# Patient Record
Sex: Female | Born: 1965
Health system: Southern US, Community
[De-identification: ages and names within clinical notes are randomized; demographics above are authoritative.]

## PROBLEM LIST (undated history)

## (undated) DIAGNOSIS — K635 Polyp of colon: Secondary | ICD-10-CM

## (undated) DIAGNOSIS — N393 Stress incontinence (female) (male): Secondary | ICD-10-CM

## (undated) DIAGNOSIS — M1711 Unilateral primary osteoarthritis, right knee: Secondary | ICD-10-CM

## (undated) DIAGNOSIS — F32A Depression, unspecified: Secondary | ICD-10-CM

## (undated) DIAGNOSIS — R569 Unspecified convulsions: Secondary | ICD-10-CM

## (undated) DIAGNOSIS — Z87442 Personal history of urinary calculi: Secondary | ICD-10-CM

## (undated) DIAGNOSIS — Z720 Tobacco use: Secondary | ICD-10-CM

## (undated) DIAGNOSIS — K219 Gastro-esophageal reflux disease without esophagitis: Secondary | ICD-10-CM

## (undated) DIAGNOSIS — M797 Fibromyalgia: Secondary | ICD-10-CM

## (undated) DIAGNOSIS — G4733 Obstructive sleep apnea (adult) (pediatric): Secondary | ICD-10-CM

## (undated) HISTORY — PX: COLON RESECTION: SHX5231

## (undated) HISTORY — PX: RIGHT OOPHORECTOMY: SHX2359

## (undated) HISTORY — PX: CHOLECYSTECTOMY: SHX55

## (undated) HISTORY — PX: APPENDECTOMY: SHX54

## (undated) HISTORY — PX: OVARY SURGERY: SHX727

## (undated) HISTORY — PX: KNEE ARTHROSCOPY: SHX127

## (undated) HISTORY — DX: Gastro-esophageal reflux disease without esophagitis: K21.9

---
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
## 1992-12-28 HISTORY — PX: LEFT OOPHORECTOMY: SHX1961

## 1998-06-29 ENCOUNTER — Inpatient Hospital Stay (HOSPITAL_COMMUNITY): Admission: AD | Admit: 1998-06-29 | Discharge: 1998-06-29 | Payer: Self-pay | Admitting: *Deleted

## 2002-12-28 HISTORY — PX: LAPAROSCOPIC ASSISTED VENTRAL HERNIA REPAIR: SHX6312

## 2002-12-28 HISTORY — PX: COLON RESECTION: SHX5231

## 2011-12-29 DIAGNOSIS — G459 Transient cerebral ischemic attack, unspecified: Secondary | ICD-10-CM

## 2011-12-29 HISTORY — DX: Transient cerebral ischemic attack, unspecified: G45.9

## 2015-01-18 HISTORY — PX: COLONOSCOPY: SHX174

## 2018-07-11 ENCOUNTER — Emergency Department: Payer: Medicare (Managed Care)

## 2018-07-11 ENCOUNTER — Emergency Department
Admission: EM | Admit: 2018-07-11 | Discharge: 2018-07-11 | Disposition: A | Discharge status: Home / Self Care | Payer: Medicare (Managed Care) | Attending: Emergency Medicine | Admitting: Emergency Medicine

## 2018-07-11 ENCOUNTER — Other Ambulatory Visit: Payer: Self-pay

## 2018-07-11 DIAGNOSIS — Y998 Other external cause status: Secondary | ICD-10-CM | POA: Insufficient documentation

## 2018-07-11 DIAGNOSIS — Z9049 Acquired absence of other specified parts of digestive tract: Secondary | ICD-10-CM | POA: Insufficient documentation

## 2018-07-11 DIAGNOSIS — S2232XA Fracture of one rib, left side, initial encounter for closed fracture: Secondary | ICD-10-CM

## 2018-07-11 DIAGNOSIS — Y929 Unspecified place or not applicable: Secondary | ICD-10-CM | POA: Insufficient documentation

## 2018-07-11 DIAGNOSIS — S299XXA Unspecified injury of thorax, initial encounter: Secondary | ICD-10-CM | POA: Diagnosis present

## 2018-07-11 DIAGNOSIS — X500XXA Overexertion from strenuous movement or load, initial encounter: Secondary | ICD-10-CM | POA: Diagnosis not present

## 2018-07-11 DIAGNOSIS — Y9389 Activity, other specified: Secondary | ICD-10-CM | POA: Insufficient documentation

## 2018-07-11 DIAGNOSIS — F17200 Nicotine dependence, unspecified, uncomplicated: Secondary | ICD-10-CM | POA: Insufficient documentation

## 2018-07-11 DIAGNOSIS — R0789 Other chest pain: Secondary | ICD-10-CM

## 2018-07-11 HISTORY — DX: Unspecified convulsions: R56.9

## 2018-07-11 LAB — CBC
HEMATOCRIT: 39.8 % (ref 35.0–47.0)
HEMOGLOBIN: 13.9 g/dL (ref 12.0–16.0)
MCH: 31.2 pg (ref 26.0–34.0)
MCHC: 35 g/dL (ref 32.0–36.0)
MCV: 89 fL (ref 80.0–100.0)
Platelets: 288 10*3/uL (ref 150–440)
RBC: 4.48 MIL/uL (ref 3.80–5.20)
RDW: 12.9 % (ref 11.5–14.5)
WBC: 7.3 10*3/uL (ref 3.6–11.0)

## 2018-07-11 LAB — BASIC METABOLIC PANEL
Anion gap: 10 (ref 5–15)
BUN: 21 mg/dL — ABNORMAL HIGH (ref 6–20)
CALCIUM: 9.3 mg/dL (ref 8.9–10.3)
CO2: 23 mmol/L (ref 22–32)
Chloride: 110 mmol/L (ref 98–111)
Creatinine, Ser: 1.04 mg/dL — ABNORMAL HIGH (ref 0.44–1.00)
GFR calc non Af Amer: >60 mL/min (ref >60)
GLUCOSE: 109 mg/dL — AB (ref 70–99)
POTASSIUM: 3.8 mmol/L (ref 3.5–5.1)
Sodium: 143 mmol/L (ref 135–145)

## 2018-07-11 LAB — FIBRIN DERIVATIVES D-DIMER (ARMC ONLY): FIBRIN DERIVATIVES D-DIMER (ARMC): 610.17 ng{FEU}/mL — AB (ref 0.00–499.00)

## 2018-07-11 LAB — TROPONIN I

## 2018-07-11 IMAGING — CT CT ANGIO CHEST
2 of 6 series · 18 of 46 positions shown · IV contrast (APPLIED)
Comparison: Chest radiograph [DATE]

CLINICAL DATA: Chest pain and to the left breast. Elevated D-dimer
level.

EXAM:
CT ANGIOGRAPHY CHEST WITH CONTRAST
TECHNIQUE: Multidetector CT imaging of the chest was performed using the
standard protocol during bolus administration of intravenous
contrast. Multiplanar CT image reconstructions and MIPs were
obtained to evaluate the vascular anatomy.
CONTRAST:  75mL [T2] IOPAMIDOL ([T2]) INJECTION 76%

[Series 5: thins · axial · 0.70mm/px · z∈[-277,-29]mm · 15 of 272 slices shown]
[im 12/272  lung]
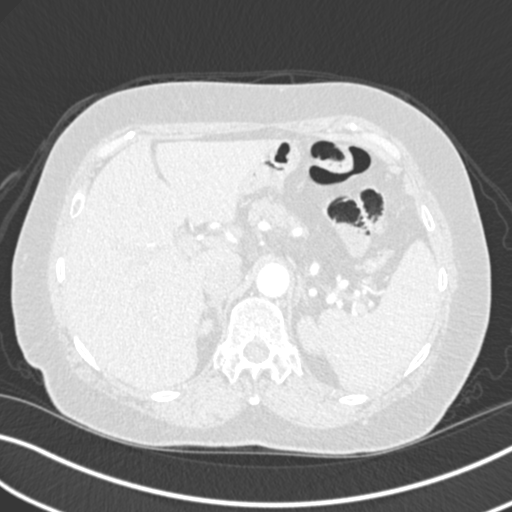
[im 36/272  soft-tissue]
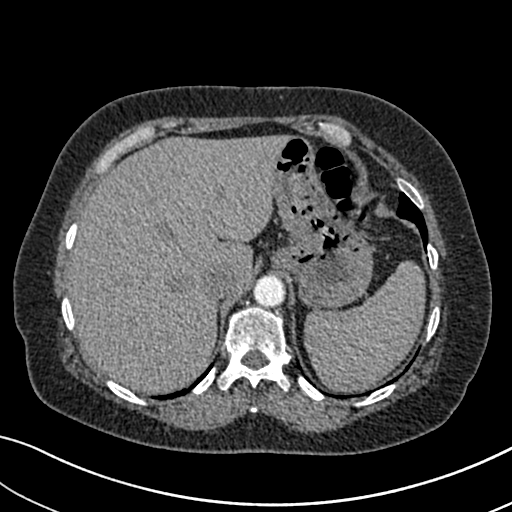
[im 48/272  lung]
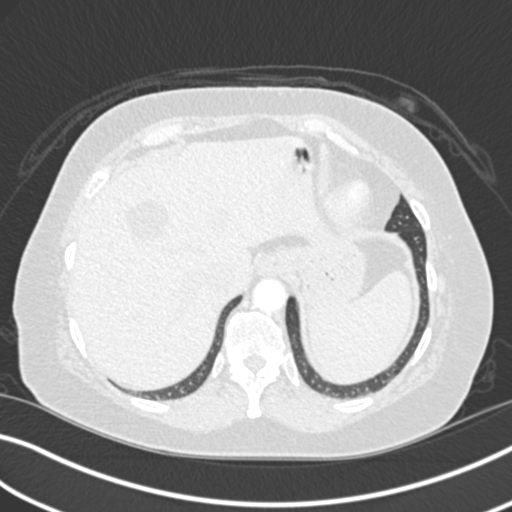
[im 71/272  soft-tissue]
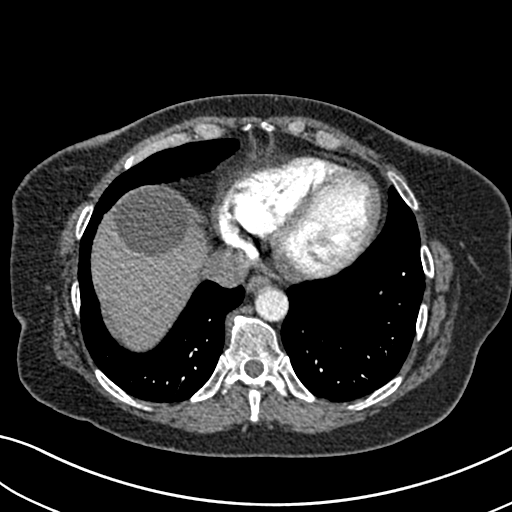
[im 83/272  lung]
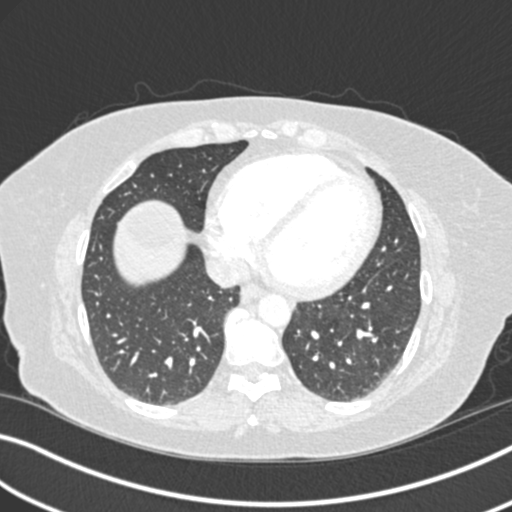
[im 107/272  soft-tissue]
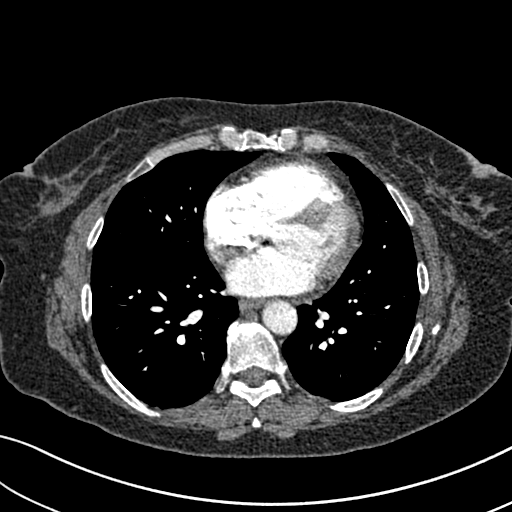
[im 118/272  lung]
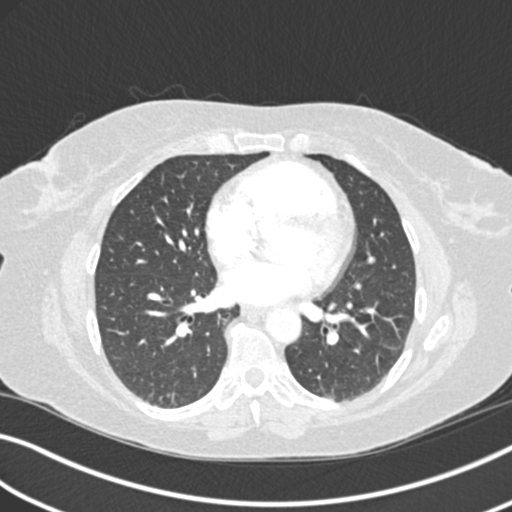
[im 142/272  soft-tissue]
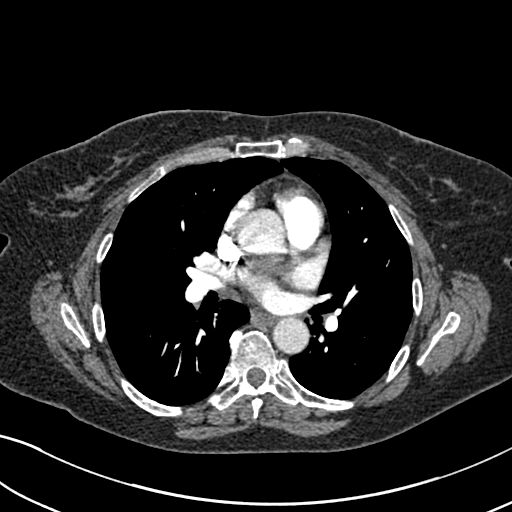
[im 154/272  lung]
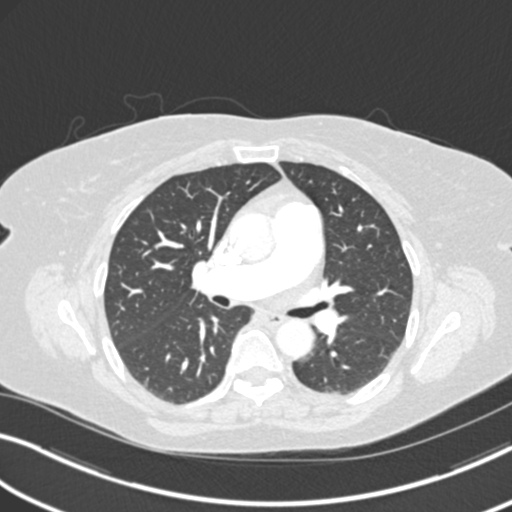
[im 165/272  soft-tissue]
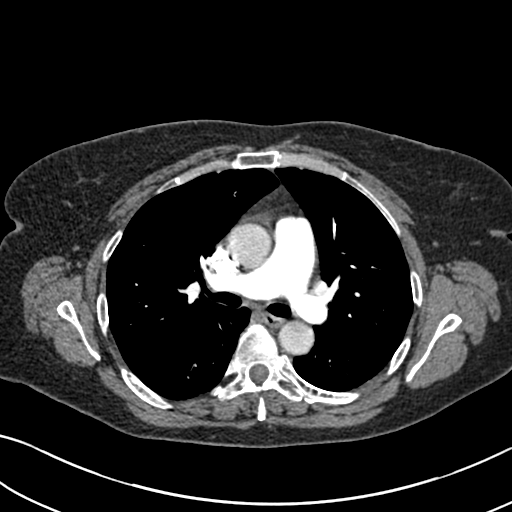
[im 189/272  lung]
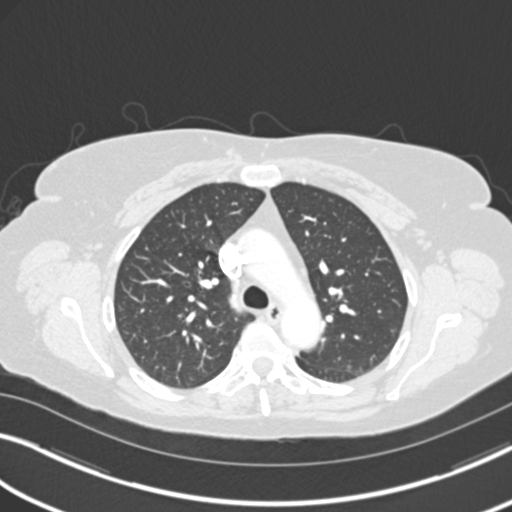
[im 201/272  soft-tissue]
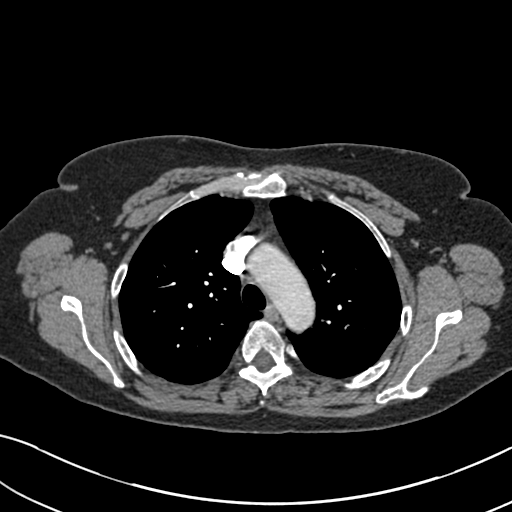
[im 224/272  lung]
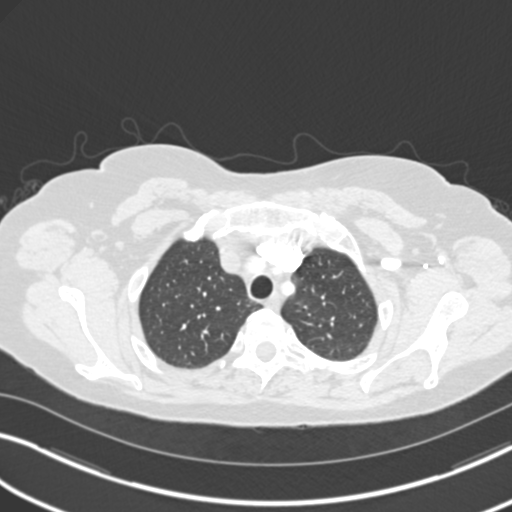
[im 236/272  soft-tissue]
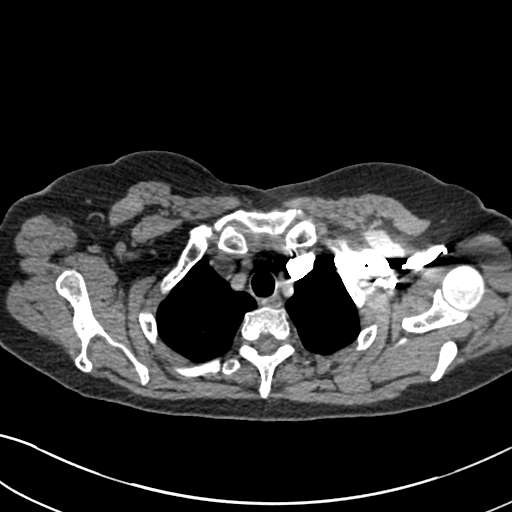
[im 260/272  lung]
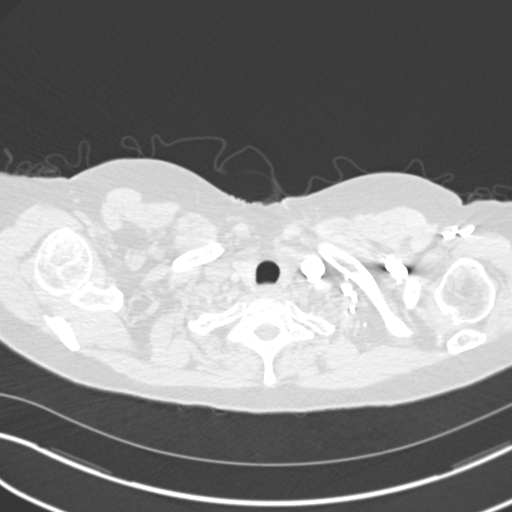

[Series 7: coronal mpr · coronal · 0.53mm/px · 3 of 84 slices shown]
[im 21/84  soft-tissue]
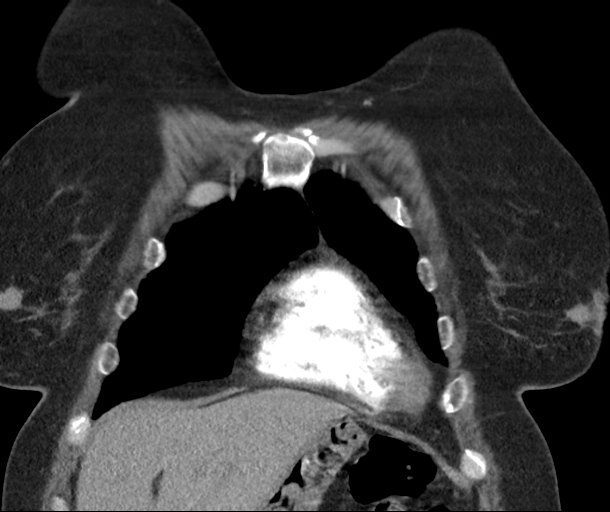
[im 42/84  soft-tissue]
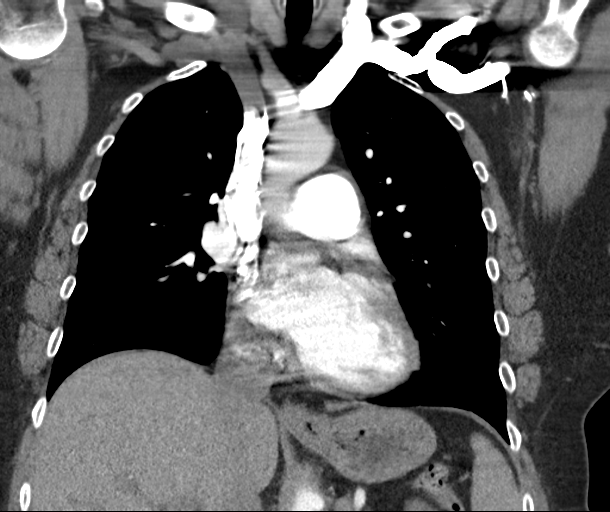
[im 63/84  soft-tissue]
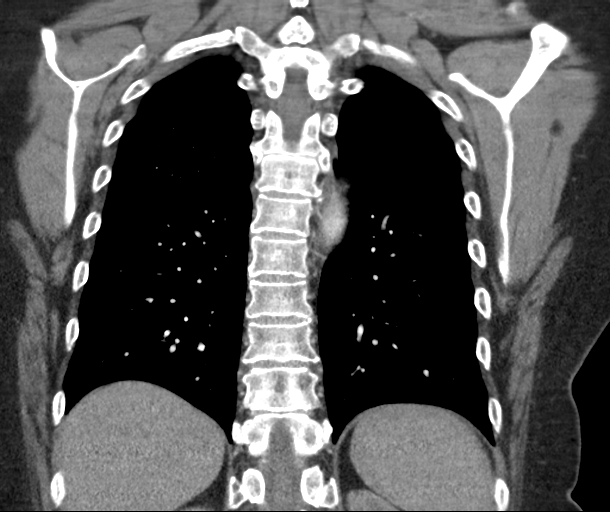

[18 of 46 positions shown; findings below may reference images not displayed]

FINDINGS: Cardiovascular: No filling defect is identified in the pulmonary
arterial tree to suggest pulmonary embolus. No acute aortic
findings.

Mediastinum/Nodes: Unremarkable

Lungs/Pleura: 3 by 4 mm right middle lobe nodule just below the
minor fissure, image 50/6. 4 by 2 mm right upper lobe nodule on
image [DATE].

Upper Abdomen: [DATE] by 5.1 cm homogeneous fluid density lesion in the
dome of the liver on image 69/4. Peripheral hypodensity posteriorly
in the spleen on image 86/4 is likely attributable to the early
phase of contrast administration.

Musculoskeletal: Thoracic spondylosis. Subtle angulation of the
cortex of the left anterior sixth rib on image 70/6 could reflect a
nondisplaced rib fracture.

Review of the MIP images confirms the above findings.
IMPRESSION: 1. No filling defect is identified in the pulmonary arterial tree to
suggest pulmonary embolus.
2. 4 mm right middle lobe nodule and 3 mm right upper lobe nodule.
No follow-up needed if patient is low-risk (and has no known or
suspected primary neoplasm). Non-contrast chest CT can be considered
in 12 months if patient is high-risk. This recommendation follows
the consensus statement: Guidelines for Management of Incidental
Pulmonary Nodules Detected on CT Images: From the [HOSPITAL]
3. Possible nondisplaced left sixth rib fracture anteriorly (finding
is subtle).
4. 5.1 cm cyst of the dome of the liver.

## 2018-07-11 IMAGING — CR DG CHEST 2V
1 series · 2 of 2 positions shown · non-contrast
Comparison: None.

CLINICAL DATA: c/o of L CP under L breast x few days. States was
moving end of last week and unsure if pulled muscle. States pain
increased over weekend. Denies radiation. Is holding area at L ribs.
States diaphoresis, dizziness, and nausea. smoker

EXAM:
CHEST - 2 VIEW

[Series 1: dg chest 2 view · 0.14mm/px · 2 of 2 slices shown]
[im 1/2]
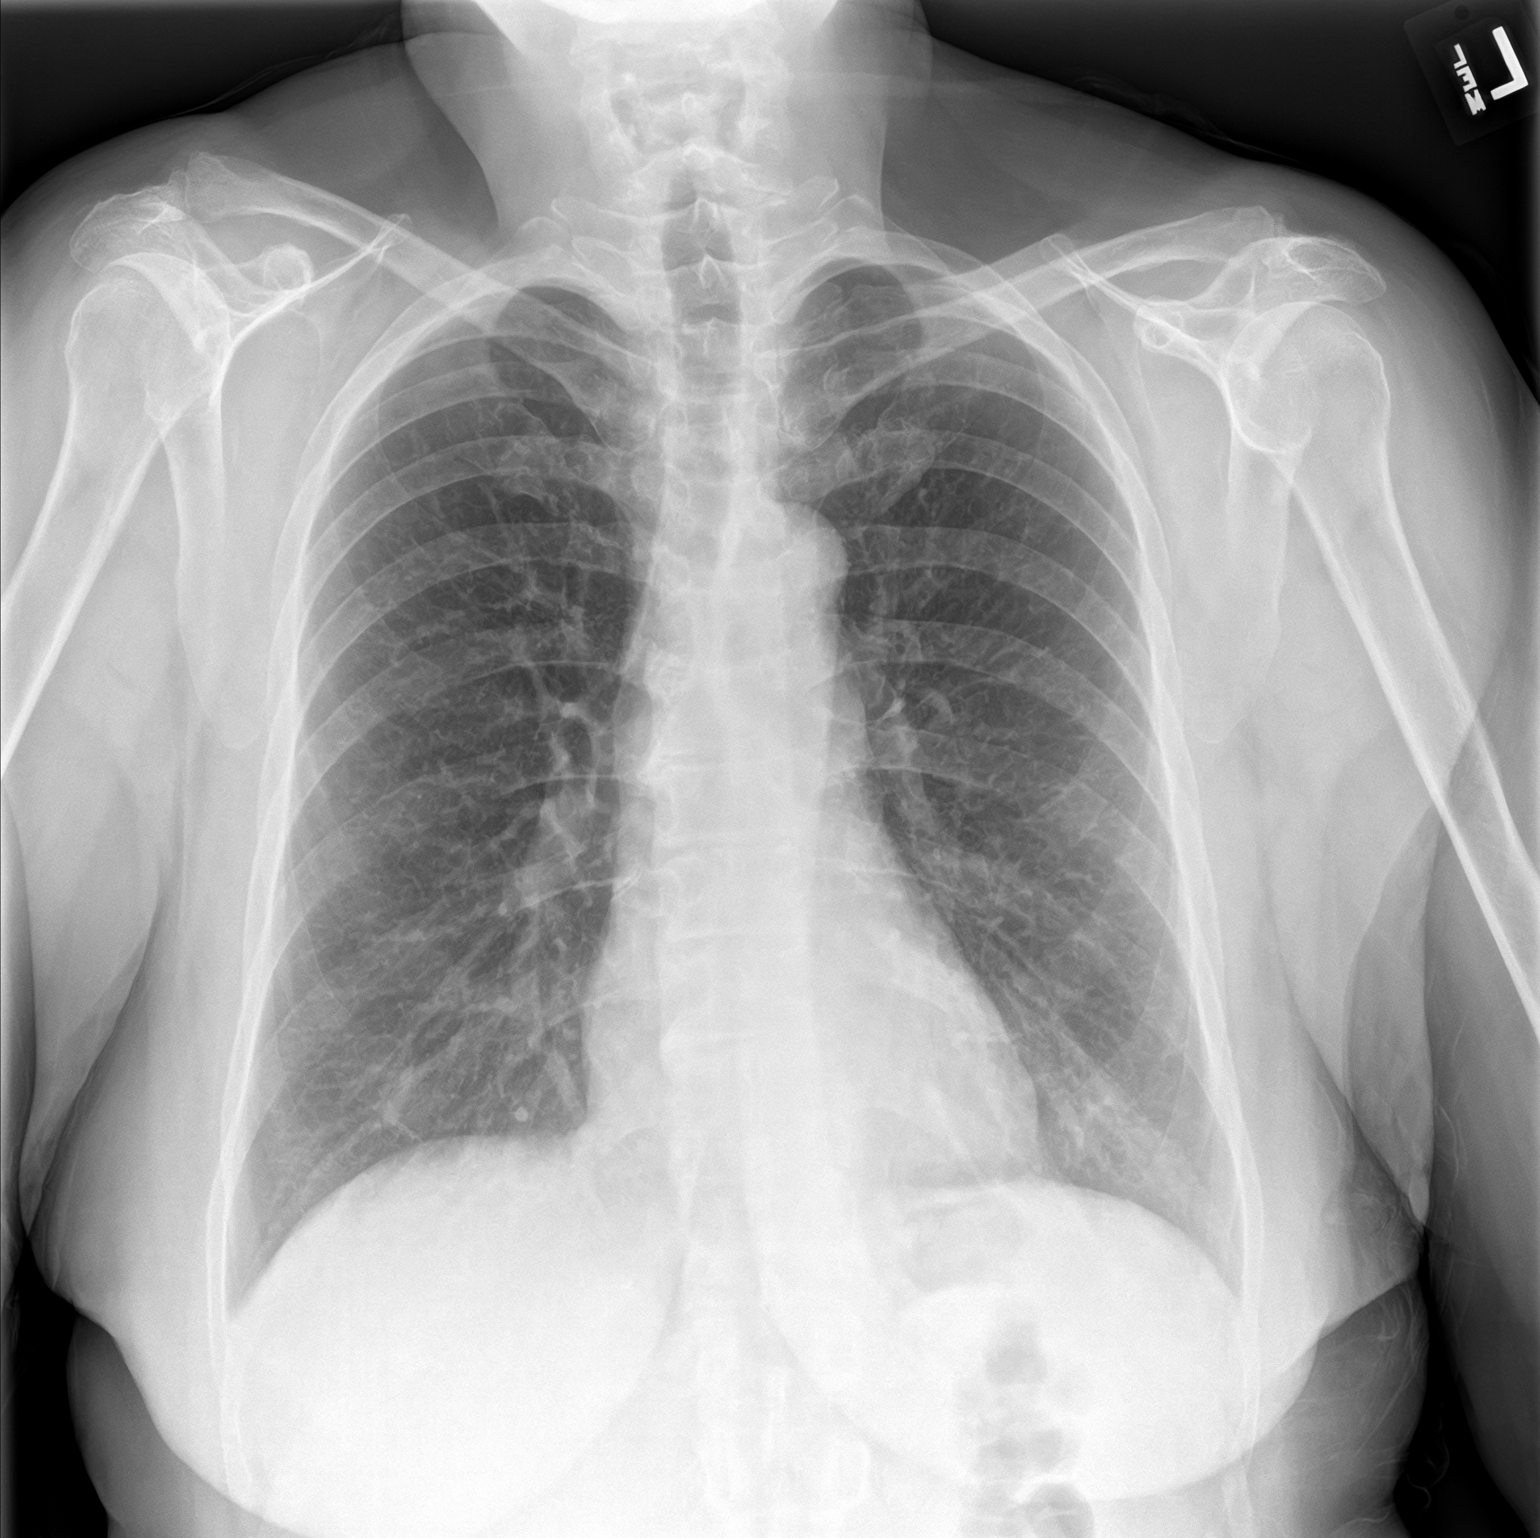
[im 2/2]
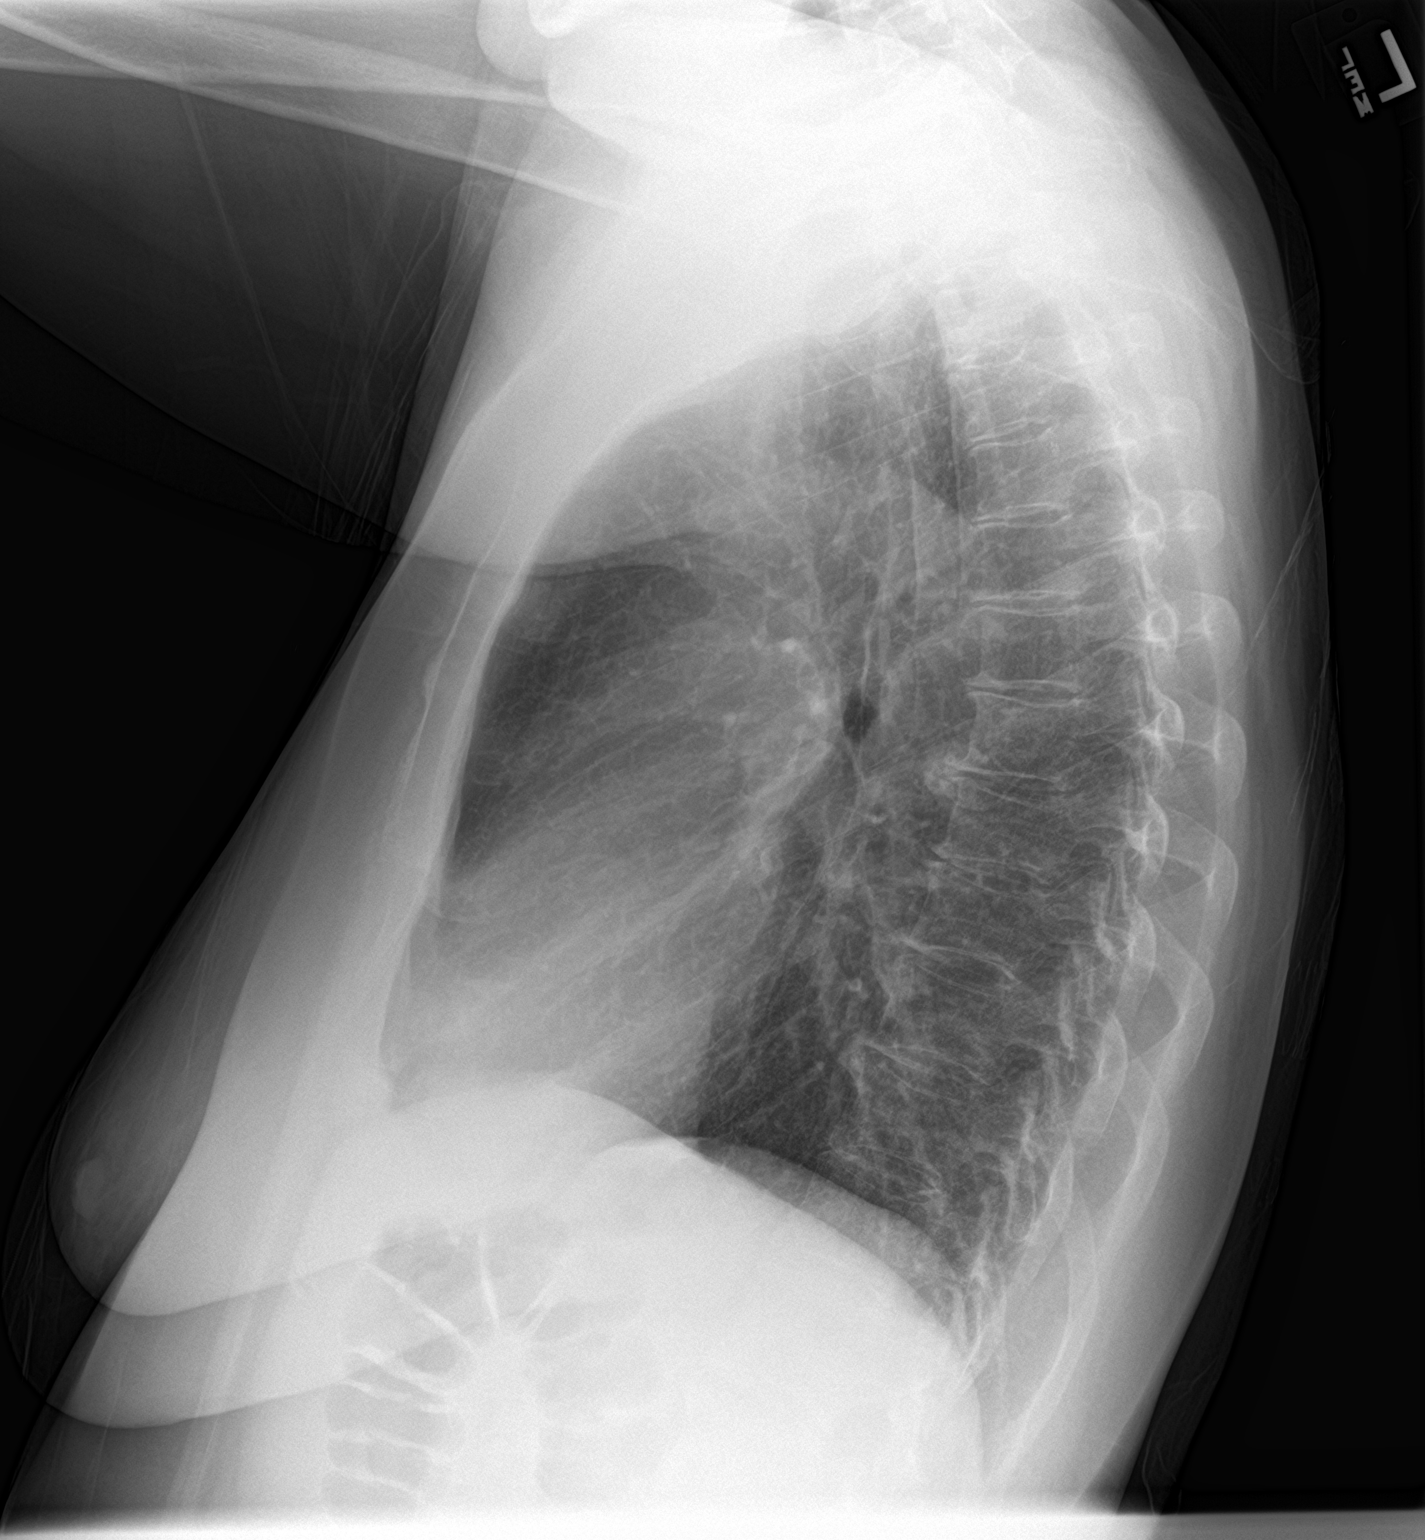

[2 of 2 positions shown; findings below may reference images not displayed]

FINDINGS: Normal mediastinum and cardiac silhouette. Normal pulmonary
vasculature. No evidence of effusion, infiltrate, or pneumothorax.
No acute bony abnormality.
IMPRESSION: No acute cardiopulmonary process.

## 2018-07-11 MED ORDER — KETOROLAC TROMETHAMINE 10 MG PO TABS: 10.0000 mg | ORAL_TABLET | Freq: Four times a day (QID) | ORAL | 0 refills | Status: DC | PRN

## 2018-07-11 MED ORDER — IOPAMIDOL (ISOVUE-370) INJECTION 76%
75.0000 mL | Freq: Once | INTRAVENOUS | Status: AC | PRN
  Administered 2018-07-11: 75 mL via INTRAVENOUS
  Filled 2018-07-11: qty 75

## 2018-07-11 MED ORDER — OXYCODONE-ACETAMINOPHEN 5-325 MG PO TABS: 1.0000 | ORAL_TABLET | Freq: Four times a day (QID) | ORAL | 0 refills | Status: DC | PRN

## 2018-07-11 MED ORDER — KETOROLAC TROMETHAMINE 30 MG/ML IJ SOLN
15.0000 mg | INTRAMUSCULAR | Status: AC
  Administered 2018-07-11: 15 mg via INTRAVENOUS
  Filled 2018-07-11: qty 1

## 2018-07-11 NOTE — ED Triage Notes (Signed)
Pt arrives to ED c/o of L CP under L breast x few days. States was moving end of last week and unsure if pulled muscle. States pain increased over weekend. Denies radiation. Is holding area at L ribs. States diaphoresis, dizziness, and nausea. Is not sweaty to touch currently.   Alert, oriented, ambulatory.

## 2018-07-11 NOTE — Discharge Instructions (Addendum)
Your CT scan does not show any blood clots in the lung.  You have a slight fracture of your 6th rib on the left, which is in the location of your pain. There are also two small nodules in the right lung.  These are not of concern at this time, but you should follow up with your doctor so they can determine if future screening is necessary.

## 2018-07-11 NOTE — ED Notes (Signed)
Signature pad not working in room at this time.  Pt verbalizes understanding of discharge paperwork without any further questions.  NAD noted.

## 2018-07-11 NOTE — ED Provider Notes (Signed)
[facility] Emergency Department Provider Note  ____________________________________________  Time seen: Approximately 9:33 PM  I have reviewed the triage vital signs and the nursing notes.   HISTORY  Chief Complaint Chest Pain    HPI Kristen Hudson is a 52 y.o. female with a history of seizures who complains of left lower chest pain for the past 3 days, gradual onset, constant.  Worse with movement and deep breathing.  Feels short of breath.  Nonradiating.  Severe and sharp.  No alleviating factors.  Started after moving furniture and other heavy objects to a new house.  She does note that 2 weeks ago she had a round trip flight to [place].  Denies any history of DVT or PE.  No leg swelling.      Past Medical History:  Diagnosis Date  . Seizures ([place])      There are no active problems to display for this patient.    Past Surgical History:  Procedure Laterality Date  . APPENDECTOMY    . CESAREAN SECTION    . CHOLECYSTECTOMY    . COLON RESECTION    . OVARY SURGERY       Prior to Admission medications   Medication Sig Start Date End Date Taking? Authorizing Provider  ketorolac (TORADOL) 10 MG tablet Take 1 tablet (10 mg total) by mouth every 6 (six) hours as needed for moderate pain. 07/11/18   Carrie Mew, MD  oxyCODONE-acetaminophen (PERCOCET) 5-325 MG tablet Take 1 tablet by mouth every 6 (six) hours as needed for severe pain. 07/11/18 07/11/19  Carrie Mew, MD     Allergies Penicillins   History reviewed. No pertinent family history.  Social History Social History   Tobacco Use  . Smoking status: Current Every Day Smoker  Substance Use Topics  . Alcohol use: Never    Frequency: Never  . Drug use: Not on file    Review of Systems  Constitutional:   No fever or chills.  ENT:   No sore throat. No rhinorrhea. Cardiovascular:   Positive as above chest pain without syncope. Respiratory:   Positive shortness of breath and  nonproductive cough. Gastrointestinal:   Negative for abdominal pain, vomiting and diarrhea.  Musculoskeletal:   Negative for focal pain or swelling All other systems reviewed and are negative except as documented above in ROS and HPI.  ____________________________________________   PHYSICAL EXAM:  VITAL SIGNS: ED Triage Vitals  Enc Vitals Group     BP 07/11/18 1730 120/67     Pulse Rate 07/11/18 1730 (!) 101     Resp 07/11/18 1730 20     Temp 07/11/18 1730 98.7 F (37.1 C)     Temp Source 07/11/18 1730 Oral     SpO2 07/11/18 1730 95 %     Weight 07/11/18 1731 193 lb (87.5 kg)     Height 07/11/18 1731 5\' 4"  (1.626 m)     Head Circumference --      Peak Flow --      Pain Score 07/11/18 1731 10     Pain Loc --      Pain Edu? --      Excl. in [place]? --     Vital signs reviewed, nursing assessments reviewed.   Constitutional:   Alert and oriented. Non-toxic appearance. Eyes:   Conjunctivae are normal. EOMI. PERRL. ENT      Head:   Normocephalic and atraumatic.      Nose:   No congestion/rhinnorhea.  Mouth/Throat:   MMM, no pharyngeal erythema. No peritonsillar mass.       Neck:   No meningismus. Full ROM. Hematological/Lymphatic/Immunilogical:   No cervical lymphadenopathy. Cardiovascular:   RRR. Symmetric bilateral radial and DP pulses.  No murmurs. Cap refill less than 2 seconds. Respiratory:   Normal respiratory effort without tachypnea/retractions. Breath sounds are clear and equal bilaterally. No wheezes/rales/rhonchi. Gastrointestinal:   Soft and nontender. Non distended. There is no CVA tenderness.  No rebound, rigidity, or guarding. Musculoskeletal:   Normal range of motion in all extremities. No joint effusions.  No lower extremity tenderness.  No edema.  Left inferolateral chest wall very tender to the touch reproducing her symptoms over the inferior ribs.  No crepitus or instability. Neurologic:   Normal speech and language.  Motor grossly intact. No acute  focal neurologic deficits are appreciated.  Skin:    Skin is warm, dry and intact. No rash noted.  No petechiae, purpura, or bullae.  No evidence of shingles  ____________________________________________    LABS (pertinent positives/negatives) (all labs ordered are listed, but only abnormal results are displayed) Labs Reviewed  BASIC METABOLIC PANEL - Abnormal; Notable for the following components:      Result Value   Glucose, Bld 109 (*)    BUN 21 (*)    Creatinine, Ser 1.04 (*)    All other components within normal limits  FIBRIN DERIVATIVES D-DIMER ([facility] ONLY) - Abnormal; Notable for the following components:   Fibrin derivatives D-dimer ([facility]) 610.17 (*)    All other components within normal limits  CBC  TROPONIN I  POC URINE PREG, ED   ____________________________________________   EKG  Interpreted by me Sinus tachycardia rate 106, normal axis and intervals.  Normal QRS ST segments and T waves.  ____________________________________________    RADIOLOGY  Dg Chest 2 View  Result Date: 07/11/2018 CLINICAL DATA:  c/o of L CP under L breast x few days. States was moving end of last week and unsure if pulled muscle. States pain increased over weekend. Denies radiation. Is holding area at L ribs. States diaphoresis, dizziness, and nausea. smoker EXAM: CHEST - 2 VIEW COMPARISON:  None. FINDINGS: Normal mediastinum and cardiac silhouette. Normal pulmonary vasculature. No evidence of effusion, infiltrate, or pneumothorax. No acute bony abnormality. IMPRESSION: No acute cardiopulmonary process. Electronically Signed   By: Suzy Bouchard M.D.   On: 07/11/2018 18:40   Ct Angio Chest Pe W And/or Wo Contrast  Result Date: 07/11/2018 CLINICAL DATA:  Chest pain and to the left breast. Elevated D-dimer level. EXAM: CT ANGIOGRAPHY CHEST WITH CONTRAST TECHNIQUE: Multidetector CT imaging of the chest was performed using the standard protocol during bolus administration of intravenous  contrast. Multiplanar CT image reconstructions and MIPs were obtained to evaluate the vascular anatomy. CONTRAST:  17mL ISOVUE-370 IOPAMIDOL (ISOVUE-370) INJECTION 76% COMPARISON:  Chest radiograph 07/11/2018 FINDINGS: Cardiovascular: No filling defect is identified in the pulmonary arterial tree to suggest pulmonary embolus. No acute aortic findings. Mediastinum/Nodes: Unremarkable Lungs/Pleura: 3 by 4 mm right middle lobe nodule just below the minor fissure, image 50/6. 4 by 2 mm right upper lobe nodule on image 30/6. Upper Abdomen: 4.8 by 5.1 cm homogeneous fluid density lesion in the dome of the liver on image 69/4. Peripheral hypodensity posteriorly in the spleen on image 86/4 is likely attributable to the early phase of contrast administration. Musculoskeletal: Thoracic spondylosis. Subtle angulation of the cortex of the left anterior sixth rib on image 70/6 could reflect a nondisplaced rib fracture. Review  of the MIP images confirms the above findings. IMPRESSION: 1. No filling defect is identified in the pulmonary arterial tree to suggest pulmonary embolus. 2. 4 mm right middle lobe nodule and 3 mm right upper lobe nodule. No follow-up needed if patient is low-risk (and has no known or suspected primary neoplasm). Non-contrast chest CT can be considered in 12 months if patient is high-risk. This recommendation follows the consensus statement: Guidelines for Management of Incidental Pulmonary Nodules Detected on CT Images: From the Fleischner Society 2017; Radiology 2017; 284:228-243. 3. Possible nondisplaced left sixth rib fracture anteriorly (finding is subtle). 4. 5.1 cm cyst of the dome of the liver. Electronically Signed   By: Van Clines M.D.   On: 07/11/2018 21:57    ____________________________________________   PROCEDURES Procedures  ____________________________________________  DIFFERENTIAL DIAGNOSIS   Chest wall strain, pneumothorax, pulmonary embolism  CLINICAL IMPRESSION /  ASSESSMENT AND PLAN / ED COURSE  Pertinent labs & imaging results that were available during my care of the patient were reviewed by me and considered in my medical decision making (see chart for details).    Patient presents with left chest wall pain after moving furniture in a recent long trip as well.  Chest wall very reproducible suggesting musculoskeletal strain, but with her risk factors I will check a d-dimer.  Doubt ACS or dissection.  Chest x-ray is negative for pneumonia or pneumothorax.  Toradol IV for pain.  Clinical Course as of Jul 11 2230  Mon Jul 11, 2018  2108 D-dimer elevated. Will obtain CTA chest.   Fibrin derivatives D-dimer [facility])(!): 610.17 [PS]    Clinical Course User Index [PS] Carrie Mew, MD     ----------------------------------------- 10:31 PM on 07/11/2018 -----------------------------------------  CTA chest negative for PE.  2 small non-concerning pulmonary nodules on the right lung.  Patient informed of this finding.  Does appear to reveal 1/6 rib fracture which is nondisplaced and very subtle, but is in the area of her pain.  Continue Toradol, very limited prescription of Percocet as needed for severe pain.  Follow-up with primary care.  ____________________________________________   FINAL CLINICAL IMPRESSION(S) / ED DIAGNOSES    Final diagnoses:  Chest wall pain  Closed fracture of one rib of left side, initial encounter     ED Discharge Orders        Ordered    ketorolac (TORADOL) 10 MG tablet  Every 6 hours PRN     07/11/18 2132    oxyCODONE-acetaminophen (PERCOCET) 5-325 MG tablet  Every 6 hours PRN     07/11/18 2230      Portions of this note were generated with dragon dictation software. Dictation errors may occur despite best attempts at proofreading.    Carrie Mew, MD 07/11/18 2231

## 2018-08-24 ENCOUNTER — Encounter: Payer: Self-pay | Admitting: Obstetrics and Gynecology

## 2018-08-24 ENCOUNTER — Other Ambulatory Visit (HOSPITAL_COMMUNITY)
Admission: RE | Admit: 2018-08-24 | Discharge: 2018-08-24 | Disposition: A | Discharge status: Home / Self Care | Payer: Medicare HMO | Source: Ambulatory Visit | Attending: Obstetrics and Gynecology | Admitting: Obstetrics and Gynecology

## 2018-08-24 ENCOUNTER — Other Ambulatory Visit
Admission: RE | Admit: 2018-08-24 | Discharge: 2018-08-24 | Disposition: A | Discharge status: Home / Self Care | Payer: Medicare HMO | Source: Ambulatory Visit | Attending: Internal Medicine | Admitting: Internal Medicine

## 2018-08-24 ENCOUNTER — Ambulatory Visit (INDEPENDENT_AMBULATORY_CARE_PROVIDER_SITE_OTHER): Payer: Medicare HMO | Admitting: Obstetrics and Gynecology

## 2018-08-24 VITALS — BP 103/72 | HR 84 | Ht 64.0 in | Wt 193.1 lb

## 2018-08-24 DIAGNOSIS — Z124 Encounter for screening for malignant neoplasm of cervix: Secondary | ICD-10-CM | POA: Diagnosis not present

## 2018-08-24 DIAGNOSIS — R569 Unspecified convulsions: Secondary | ICD-10-CM | POA: Diagnosis present

## 2018-08-24 DIAGNOSIS — Z1272 Encounter for screening for malignant neoplasm of vagina: Secondary | ICD-10-CM | POA: Insufficient documentation

## 2018-08-24 DIAGNOSIS — Z01419 Encounter for gynecological examination (general) (routine) without abnormal findings: Secondary | ICD-10-CM | POA: Diagnosis present

## 2018-08-24 DIAGNOSIS — R102 Pelvic and perineal pain: Secondary | ICD-10-CM

## 2018-08-24 DIAGNOSIS — G8929 Other chronic pain: Secondary | ICD-10-CM | POA: Insufficient documentation

## 2018-08-24 DIAGNOSIS — N951 Menopausal and female climacteric states: Secondary | ICD-10-CM

## 2018-08-24 DIAGNOSIS — Z90722 Acquired absence of ovaries, bilateral: Secondary | ICD-10-CM | POA: Diagnosis not present

## 2018-08-24 DIAGNOSIS — Z6833 Body mass index (BMI) 33.0-33.9, adult: Secondary | ICD-10-CM

## 2018-08-24 DIAGNOSIS — Z98891 History of uterine scar from previous surgery: Secondary | ICD-10-CM | POA: Diagnosis not present

## 2018-08-24 DIAGNOSIS — R350 Frequency of micturition: Secondary | ICD-10-CM | POA: Insufficient documentation

## 2018-08-24 DIAGNOSIS — R3915 Urgency of urination: Secondary | ICD-10-CM

## 2018-08-24 LAB — PHENYTOIN LEVEL, TOTAL: Phenytoin Lvl: 14.9 ug/mL (ref 10.0–20.0)

## 2018-08-24 NOTE — Patient Instructions (Signed)
1.  Pap smear is done today. 2.  Physical exam reveals no evidence of gynecologic abnormality. 3.  Hormone replacement therapy is declined due to cost of medication 4.  Return if pelvic pain symptoms worsen 5.  Recommend 1200 mg of calcium per day and vitamin D 800 international units daily.  Calcium supplements include:  Citracal  Os-Cal  Viactiv chews  Tums 6.  Recommend weightbearing exercise to minimize risk of developing osteoporosis

## 2018-08-24 NOTE — Progress Notes (Signed)
NEW PATIENT GYN ENCOUNTER NOTE  Subjective:       Kristen Hudson is a 52 y.o. G42P3003 female is here for gynecologic evaluation of the following issues:  1.  Pelvic pain 2.  Vasomotor symptoms   52 year old single white female para 3-0-0-3, menopausal, not on any HRT therapy, not currently sexually active, status post laparoscopic BSO for recurrent ovarian cysts, presents from Dr. Rebecka Apley for pelvic exam with Pap smear and evaluation of pelvic pain.  Patient has significant surgical history notable for cesarean section x3; first C-section was for breech, second C-section was for failure to progress, and third C-section was repeat. Patient has had laparoscopic bilateral salpingo-oophorectomy for recurrent ovarian cysts performed by a gynecologist in [facility]. Kristen Hudson also has had laparotomy surgery for colon pathology-type unknown.  GU history: Urinary frequency unable to be quantified by patient Nocturia x6 History of multiple kidney infections History of bladder infections No work-up by urology per patient's report. Patient does not leak urine with coughing or sneezing. Patient denies any bulging mass from the introitus; she has occasional pelvic pressure usually associated with UTIs. Main concern today is bilateral pelvic pain that occurs intermittently without associated exacerbating or alleviating factors; the pain is characterized as sharp and can last as much as 30 minutes.  She is not take any chronic medication for the pelvic pain.  It does not affect routine activities of daily living.  GI history: Bowel movements are daily. Patient occasionally has to splint with a bowel movement.  She has had hemorrhoid banding with subsequent recurrence of hemorrhoids. Colonoscopy in 2018 was normal   Gynecologic History No LMP recorded. Patient is postmenopausal. Birth control-menopause Menarche-age 38 Menopause-age 14 No history of HRT therapy No history of abnormal Pap  smears. No history of STDs. Not sexually active.  Obstetric History OB History  Gravida Para Term Preterm AB Living  3 3 3     3   SAB TAB Ectopic Multiple Live Births          3    # Outcome Date GA Lbr Len/2nd Weight Sex Delivery Anes PTL Lv  3 Term 45    M CS-LTranv   LIV  2 Term 66    F CS-LTranv   LIV  1 Term 89    M CS-LTranv   LIV    Past Medical History:  Diagnosis Date  . Acid reflux   . Seizures ([place])     Past Surgical History:  Procedure Laterality Date  . APPENDECTOMY    . CESAREAN SECTION    . CHOLECYSTECTOMY    . COLON RESECTION    . OVARY SURGERY      Current Outpatient Medications on File Prior to Visit  Medication Sig Dispense Refill  . HYDROcodone-acetaminophen (NORCO/VICODIN) 5-325 MG tablet Take by mouth.    . levETIRAcetam (KEPPRA) 500 MG tablet Take by mouth.    Marland Kitchen omeprazole (PRILOSEC) 20 MG capsule 1 po TID the 1st day then 1 po BID x 1 week    . phenytoin (DILANTIN) 100 MG ER capsule Take by mouth.     No current facility-administered medications on file prior to visit.     Allergies  Allergen Reactions  . Penicillins     Social History   Socioeconomic History  . Marital status: Single    Spouse name: Not on file  . Number of children: Not on file  . Years of education: Not on file  . Highest education level: Not on file  Occupational History  . Not on file  Social Needs  . Financial resource strain: Not on file  . Food insecurity:    Worry: Not on file    Inability: Not on file  . Transportation needs:    Medical: Not on file    Non-medical: Not on file  Tobacco Use  . Smoking status: Current Every Day Smoker    Packs/day: 0.25    Types: Cigarettes  . Smokeless tobacco: Never Used  Substance and Sexual Activity  . Alcohol use: Not Currently    Frequency: Never  . Drug use: Not Currently  . Sexual activity: Not Currently  Lifestyle  . Physical activity:    Days per week: Not on file    Minutes per session: Not  on file  . Stress: Not on file  Relationships  . Social connections:    Talks on phone: Not on file    Gets together: Not on file    Attends religious service: Not on file    Active member of club or organization: Not on file    Attends meetings of clubs or organizations: Not on file    Relationship status: Not on file  . Intimate partner violence:    Fear of current or ex partner: Not on file    Emotionally abused: Not on file    Physically abused: Not on file    Forced sexual activity: Not on file  Other Topics Concern  . Not on file  Social History Narrative  . Not on file    Family History  Problem Relation Age of Onset  . Breast cancer Mother   . Colon cancer Father   . Diabetes Father   . Ovarian cancer Neg Hx     The following portions of the patient's history were reviewed and updated as appropriate: allergies, current medications, past family history, past medical history, past social history, past surgical history and problem list.  Review of Systems Review of Systems  Constitutional:       Vasomotor symptoms moderate  HENT: Negative.   Eyes: Negative.   Respiratory: Negative.   Cardiovascular: Negative.   Gastrointestinal: Positive for abdominal pain. Negative for blood in stool, constipation, diarrhea, nausea and vomiting.       Occasional splinting with bowel movements Bilateral pelvic pain, intermittent, sharp  Genitourinary: Positive for frequency and urgency.  Musculoskeletal: Negative.   Skin: Negative.   Neurological: Negative.   Endo/Heme/Allergies: Negative.   Psychiatric/Behavioral: Negative.      Objective:   BP 103/72   Pulse 84   Ht 5\' 4"  (1.626 m)   Wt 193 lb 1.6 oz (87.6 kg)   BMI 33.15 kg/m  CONSTITUTIONAL: Well-developed, well-nourished female in no acute distress.  HENT:  Normocephalic, atraumatic.  NECK: Normal range of motion, supple, no masses.  Normal thyroid.  SKIN: Skin is warm and dry. No rash noted. Not diaphoretic. No  erythema. No pallor. [place]: Alert and oriented to person, place, and time.  PSYCHIATRIC: Normal mood and affect. Normal behavior. Normal judgment and thought content. CARDIOVASCULAR:Not Examined RESPIRATORY: Not Examined BREASTS: Not Examined ABDOMEN: Soft, non distended; Non tender.  No Organomegaly.  No hernias PELVIC:  External Genitalia: Normal  BUS: Normal  Vagina: Normal mucosa; cervix is suspended high anteriorly in the vaginal vault and cannot optimally be seen with a long Pederson speculum; Pap smear is done; no cystocele or rectocele  Cervix: Normal to palpation without cervical motion tenderness  Uterus: Small, mobile, nontender  Adnexa:  Nonpalpable and nontender  RV: Normal external exam  Bladder: Nontender MUSCULOSKELETAL: Normal range of motion. No tenderness.  No cyanosis, clubbing, or edema.     Assessment:   1. Chronic pelvic pain in female-unclear etiology; no palpable abnormality on clinical exam; possibly related to pelvic adhesive disease from multiple surgeries  2. Status post bilateral salpingo-oophorectomy (BSO), with associated vasomotor symptoms  3. H/O: C-section x3  4. Vasomotor symptoms due to menopause  5.  Needs Pap smear  6.  Urinary frequency and urgency, with history of UTIs and pyelonephritis     Plan:   1 Pap smear is performed. 2.  Hormone replacement therapy for vasomotor symptom management is offered but declined due to cost 3.  Calcium 1200 mg/day and vitamin D 800 international units daily is recommended. 4.  Weightbearing exercises recommended. 5.  Monitor pelvic pain symptoms and return if symptoms worsen 6.  Consider urology evaluation for urinary frequency and urgency 7.  Urine culture and urinalysis  Brayton Mars, MD  Note: This dictation was prepared with Dragon dictation along with smaller phrase technology. Any transcriptional errors that result from this process are unintentional.

## 2018-08-30 LAB — CYTOLOGY - PAP
Diagnosis: NEGATIVE
HPV: NOT DETECTED

## 2018-10-17 ENCOUNTER — Emergency Department
Admission: EM | Admit: 2018-10-17 | Discharge: 2018-10-17 | Disposition: A | Discharge status: Home / Self Care | Payer: Medicare HMO | Attending: Emergency Medicine | Admitting: Emergency Medicine

## 2018-10-17 ENCOUNTER — Other Ambulatory Visit: Payer: Self-pay

## 2018-10-17 ENCOUNTER — Emergency Department: Payer: Medicare HMO

## 2018-10-17 ENCOUNTER — Encounter: Payer: Self-pay | Admitting: Emergency Medicine

## 2018-10-17 DIAGNOSIS — R102 Pelvic and perineal pain: Secondary | ICD-10-CM | POA: Diagnosis not present

## 2018-10-17 DIAGNOSIS — N938 Other specified abnormal uterine and vaginal bleeding: Secondary | ICD-10-CM | POA: Diagnosis not present

## 2018-10-17 DIAGNOSIS — Z79899 Other long term (current) drug therapy: Secondary | ICD-10-CM | POA: Insufficient documentation

## 2018-10-17 DIAGNOSIS — F17210 Nicotine dependence, cigarettes, uncomplicated: Secondary | ICD-10-CM | POA: Diagnosis not present

## 2018-10-17 LAB — URINALYSIS, COMPLETE (UACMP) WITH MICROSCOPIC
BILIRUBIN URINE: NEGATIVE
GLUCOSE, UA: NEGATIVE mg/dL
KETONES UR: NEGATIVE mg/dL
NITRITE: NEGATIVE
PH: 5 (ref 5.0–8.0)
Protein, ur: NEGATIVE mg/dL
Specific Gravity, Urine: 1.005 (ref 1.005–1.030)

## 2018-10-17 LAB — CBC WITH DIFFERENTIAL/PLATELET
ABS IMMATURE GRANULOCYTES: 0.04 10*3/uL (ref 0.00–0.07)
BASOS PCT: 1 %
Basophils Absolute: 0.1 10*3/uL (ref 0.0–0.1)
Eosinophils Absolute: 0.1 10*3/uL (ref 0.0–0.5)
Eosinophils Relative: 1 %
HCT: 41.9 % (ref 36.0–46.0)
HEMOGLOBIN: 14.5 g/dL (ref 12.0–15.0)
IMMATURE GRANULOCYTES: 0 %
LYMPHS ABS: 2.5 10*3/uL (ref 0.7–4.0)
Lymphocytes Relative: 26 %
MCH: 31.2 pg (ref 26.0–34.0)
MCHC: 34.6 g/dL (ref 30.0–36.0)
MCV: 90.1 fL (ref 80.0–100.0)
MONOS PCT: 8 %
Monocytes Absolute: 0.7 10*3/uL (ref 0.1–1.0)
NEUTROS ABS: 6.2 10*3/uL (ref 1.7–7.7)
NEUTROS PCT: 64 %
NRBC: 0 % (ref 0.0–0.2)
PLATELETS: 307 10*3/uL (ref 150–400)
RBC: 4.65 MIL/uL (ref 3.87–5.11)
RDW: 12.4 % (ref 11.5–15.5)
WBC: 9.6 10*3/uL (ref 4.0–10.5)

## 2018-10-17 LAB — WET PREP, GENITAL
Clue Cells Wet Prep HPF POC: NONE SEEN
SPERM: NONE SEEN
Trich, Wet Prep: NONE SEEN
YEAST WET PREP: NONE SEEN

## 2018-10-17 LAB — COMPREHENSIVE METABOLIC PANEL
ALT: 21 U/L (ref 0–44)
AST: 22 U/L (ref 15–41)
Albumin: 4.6 g/dL (ref 3.5–5.0)
Alkaline Phosphatase: 76 U/L (ref 38–126)
Anion gap: 8 (ref 5–15)
BUN: 23 mg/dL — AB (ref 6–20)
CALCIUM: 8.9 mg/dL (ref 8.9–10.3)
CHLORIDE: 109 mmol/L (ref 98–111)
CO2: 22 mmol/L (ref 22–32)
CREATININE: 0.93 mg/dL (ref 0.44–1.00)
GFR calc Af Amer: >60 mL/min (ref >60)
GFR calc non Af Amer: >60 mL/min (ref >60)
Glucose, Bld: 99 mg/dL (ref 70–99)
Potassium: 4.2 mmol/L (ref 3.5–5.1)
SODIUM: 139 mmol/L (ref 135–145)
Total Bilirubin: 0.6 mg/dL (ref 0.3–1.2)
Total Protein: 7.3 g/dL (ref 6.5–8.1)

## 2018-10-17 LAB — PREGNANCY, URINE: Preg Test, Ur: NEGATIVE

## 2018-10-17 LAB — CHLAMYDIA/NGC RT PCR (ARMC ONLY)
Chlamydia Tr: NOT DETECTED
N GONORRHOEAE: NOT DETECTED

## 2018-10-17 MED ORDER — OXYCODONE-ACETAMINOPHEN 5-325 MG PO TABS
1.0000 | ORAL_TABLET | Freq: Once | ORAL | Status: AC
  Administered 2018-10-17: 1 via ORAL
  Filled 2018-10-17: qty 1

## 2018-10-17 NOTE — ED Notes (Signed)
AAOx3.  C/O two week intermittent history of lower abdominal / pelvic pressure and intermittent spotting.  Denies dysuria.  States noticed vaginal bleeding that started yesterday.  Patient is AAOx3.  Skin warm and dry.  NAD

## 2018-10-17 NOTE — ED Provider Notes (Signed)
[facility] Emergency Department Provider Note  ___________________________________________   First MD Initiated Contact with Patient 10/17/18 1403     (approximate)  I have reviewed the triage vital signs and the nursing notes.   HISTORY  Chief Complaint Vaginal Bleeding   HPI Kristen Hudson is a 52 y.o. female with a history of acid reflux as well as seizure disorder who was presented to the emergency department several weeks of lower abdominal cramping and now 2 days of bright red blood per vagina.  Patient says that she notices the blood when she is wiping but she is not having to wear a pad or tampon.  Does not have an OB/GYN locally since moving from [place].  Denies any radiation of the pain.  Denies any vaginal discharge and is not concerned about STDs at this time.  Says that she had a bilateral oophorectomy many years ago.   Past Medical History:  Diagnosis Date  . Acid reflux   . Seizures [facility])     Patient Active Problem List   Diagnosis Date Noted  . Chronic pelvic pain in female 08/24/2018  . Status post bilateral salpingo-oophorectomy (BSO) 08/24/2018  . History of 3 cesarean sections 08/24/2018  . Vasomotor symptoms due to menopause 08/24/2018  . BMI 33.0-33.9,adult 08/24/2018  . Urinary urgency 08/24/2018  . Urinary frequency 08/24/2018    Past Surgical History:  Procedure Laterality Date  . APPENDECTOMY    . CESAREAN SECTION    . CHOLECYSTECTOMY    . COLON RESECTION    . OVARY SURGERY      Prior to Admission medications   Medication Sig Start Date End Date Taking? Authorizing Provider  HYDROcodone-acetaminophen (NORCO/VICODIN) 5-325 MG tablet Take by mouth. 07/21/18   [provider]  levETIRAcetam (KEPPRA) 500 MG tablet Take by mouth. 12/16/17   [provider]  omeprazole (PRILOSEC) 20 MG capsule 1 po TID the 1st day then 1 po BID x 1 week 12/16/17   [provider]  phenytoin (DILANTIN) 100 MG ER  capsule Take by mouth. 06/03/18 06/03/19  [provider]    Allergies Penicillins  Family History  Problem Relation Age of Onset  . Breast cancer Mother   . Colon cancer Father   . Diabetes Father   . Ovarian cancer Neg Hx     Social History Social History   Tobacco Use  . Smoking status: Current Every Day Smoker    Packs/day: 0.25    Types: Cigarettes  . Smokeless tobacco: Never Used  Substance Use Topics  . Alcohol use: Not Currently    Frequency: Never  . Drug use: Not Currently    Review of Systems  Constitutional: No fever/chills Eyes: No visual changes. ENT: No sore throat. Cardiovascular: Denies chest pain. Respiratory: Denies shortness of breath. Gastrointestinal:  No nausea, no vomiting.  No diarrhea.  No constipation. Genitourinary: Negative for dysuria. Musculoskeletal: Negative for back pain. Skin: Negative for rash. Neurological: Negative for headaches, focal weakness or numbness.   ____________________________________________   PHYSICAL EXAM:  VITAL SIGNS: ED Triage Vitals  Enc Vitals Group     BP 10/17/18 1148 112/85     Pulse Rate 10/17/18 1148 89     Resp 10/17/18 1148 18     Temp 10/17/18 1148 98.2 F (36.8 C)     Temp Source 10/17/18 1148 Oral     SpO2 10/17/18 1148 96 %     Weight 10/17/18 1150 197 lb (89.4 kg)  Height 10/17/18 1150 5\' 4"  (1.626 m)     Head Circumference --      Peak Flow --      Pain Score 10/17/18 1150 0     Pain Loc --      Pain Edu? --      Excl. in [place]? --     Constitutional: Alert and oriented. Well appearing and in no acute distress. Eyes: Conjunctivae are normal.  Head: Atraumatic. Nose: No congestion/rhinnorhea. Mouth/Throat: Mucous membranes are moist.  Neck: No stridor.   Cardiovascular: Normal rate, regular rhythm. Grossly normal heart sounds.  Good peripheral circulation. Respiratory: Normal respiratory effort.  No retractions. Lungs CTAB. Gastrointestinal: Soft with minimal suprapubic  tenderness to palpation.  No distention. No CVA tenderness. Genitourinary: Normal external exam grossly without any lesions.  Speculum exam without any discharge nor is or any bleeding.  Unable to visualize the cervix.  Patient says that she has a very posterior cervix with a retroverted uterus.  Bimanual exam without any CMT.  No tenderness nor masses palpated. Musculoskeletal: No lower extremity tenderness nor edema.  No joint effusions. Neurologic:  Normal speech and language. No gross focal neurologic deficits are appreciated. Skin:  Skin is warm, dry and intact. No rash noted. Psychiatric: Mood and affect are normal. Speech and behavior are normal.  ____________________________________________   LABS (all labs ordered are listed, but only abnormal results are displayed)  Labs Reviewed  WET PREP, GENITAL - Abnormal; Notable for the following components:      Result Value   WBC, Wet Prep HPF POC FEW (*)    All other components within normal limits  COMPREHENSIVE METABOLIC PANEL - Abnormal; Notable for the following components:   BUN 23 (*)    All other components within normal limits  CHLAMYDIA/NGC RT PCR ([facility] ONLY)  CBC WITH DIFFERENTIAL/PLATELET  PREGNANCY, URINE  URINALYSIS, COMPLETE (UACMP) WITH MICROSCOPIC   ____________________________________________  EKG   ____________________________________________  RADIOLOGY  Uterus appearing grossly normal.  Study limitations as noted in the body of the ultrasound report.  Surgically removed ovaries, bilaterally. ____________________________________________   PROCEDURES  Procedure(s) performed:   Procedures  Critical Care performed:   ____________________________________________   INITIAL IMPRESSION / ASSESSMENT AND PLAN / ED COURSE  Pertinent labs & imaging results that were available during my care of the patient were reviewed by me and considered in my medical decision making (see chart for  details).  Differential diagnosis includes, but is not limited to, ovarian cyst, ovarian torsion, acute appendicitis, diverticulitis, urinary tract infection/pyelonephritis, endometriosis, bowel obstruction, colitis, renal colic, gastroenteritis, hernia, fibroids, endometriosis, pregnancy related pain including ectopic pregnancy, etc. As part of my medical decision making, I reviewed the following data within the electronic MEDICAL RECORD NUMBER Notes from prior ED visits  ----------------------------------------- 5:04 PM on 10/17/2018 -----------------------------------------  Patient at this time without any distress.  We discussed the exam results.  Discussed possibility of CAT scan to explore other pathology.  Patient says that she would rather do this as an outpatient.  I believe this is reasonable.  However, we did discuss the possibility of cancer.  Patient denies any weight loss.  Says that she will be able to follow with her primary care doctor.  We also discussed follow-up with OB/GYN for need for possible biopsy with of the endometrium.  She is understanding of the diagnosis and willing to comply but will be discharged at this time. ____________________________________________   FINAL CLINICAL IMPRESSION(S) / ED DIAGNOSES  Final diagnoses:  Pelvic pain  Pelvic pain  dysfunctional uterine bleeding.    NEW MEDICATIONS STARTED DURING THIS VISIT:  New Prescriptions   No medications on file     Note:  This document was prepared using Dragon voice recognition software and may include unintentional dictation errors.     Orbie Pyo, MD 10/17/18 [phone]

## 2018-10-17 NOTE — ED Notes (Signed)
AAOx3.  Skin warm and dry.  NAD 

## 2018-10-17 NOTE — ED Triage Notes (Signed)
Vaginal bleeding started today. States history of some spotting. States has not had periods for "years". Lower abdominal pain. Has had bilateral oophorectomy.

## 2018-10-24 ENCOUNTER — Other Ambulatory Visit (HOSPITAL_COMMUNITY)
Admission: RE | Admit: 2018-10-24 | Discharge: 2018-10-24 | Disposition: A | Discharge status: Home / Self Care | Payer: Medicare HMO | Source: Ambulatory Visit | Attending: Certified Nurse Midwife | Admitting: Certified Nurse Midwife

## 2018-10-24 ENCOUNTER — Encounter: Payer: Self-pay | Admitting: Certified Nurse Midwife

## 2018-10-24 ENCOUNTER — Ambulatory Visit (INDEPENDENT_AMBULATORY_CARE_PROVIDER_SITE_OTHER): Payer: Medicare HMO | Admitting: Certified Nurse Midwife

## 2018-10-24 VITALS — BP 102/78 | HR 74 | Ht 64.0 in | Wt 192.4 lb

## 2018-10-24 DIAGNOSIS — N941 Unspecified dyspareunia: Secondary | ICD-10-CM | POA: Diagnosis not present

## 2018-10-24 DIAGNOSIS — N870 Mild cervical dysplasia: Secondary | ICD-10-CM | POA: Diagnosis not present

## 2018-10-24 DIAGNOSIS — N950 Postmenopausal bleeding: Secondary | ICD-10-CM | POA: Diagnosis not present
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
## 2018-10-24 NOTE — Patient Instructions (Signed)

## 2018-10-28 ENCOUNTER — Telehealth: Payer: Self-pay

## 2018-10-28 NOTE — Telephone Encounter (Signed)
Per ATs request- [facility] was contacted re: endometrial biopsy results. Spoke with Varney Biles and per pathologist the specimen was more cervical than endometrial. AT informed. Varney Biles will change specimen source to cervical per AT.

## 2018-11-16 ENCOUNTER — Emergency Department: Payer: Medicare HMO

## 2018-11-16 ENCOUNTER — Emergency Department
Admission: EM | Admit: 2018-11-16 | Discharge: 2018-11-16 | Disposition: A | Discharge status: Home / Self Care | Payer: Medicare HMO | Attending: Emergency Medicine | Admitting: Emergency Medicine

## 2018-11-16 ENCOUNTER — Other Ambulatory Visit: Payer: Self-pay

## 2018-11-16 DIAGNOSIS — F17210 Nicotine dependence, cigarettes, uncomplicated: Secondary | ICD-10-CM | POA: Diagnosis not present

## 2018-11-16 DIAGNOSIS — M79604 Pain in right leg: Secondary | ICD-10-CM | POA: Insufficient documentation

## 2018-11-16 DIAGNOSIS — S4992XA Unspecified injury of left shoulder and upper arm, initial encounter: Secondary | ICD-10-CM | POA: Diagnosis present

## 2018-11-16 DIAGNOSIS — Z79899 Other long term (current) drug therapy: Secondary | ICD-10-CM | POA: Diagnosis not present

## 2018-11-16 DIAGNOSIS — W108XXA Fall (on) (from) other stairs and steps, initial encounter: Secondary | ICD-10-CM | POA: Insufficient documentation

## 2018-11-16 DIAGNOSIS — Y929 Unspecified place or not applicable: Secondary | ICD-10-CM | POA: Insufficient documentation

## 2018-11-16 DIAGNOSIS — Y998 Other external cause status: Secondary | ICD-10-CM | POA: Insufficient documentation

## 2018-11-16 DIAGNOSIS — R0789 Other chest pain: Secondary | ICD-10-CM | POA: Insufficient documentation

## 2018-11-16 DIAGNOSIS — Y93K1 Activity, walking an animal: Secondary | ICD-10-CM | POA: Insufficient documentation

## 2018-11-16 DIAGNOSIS — S42292A Other displaced fracture of upper end of left humerus, initial encounter for closed fracture: Secondary | ICD-10-CM | POA: Diagnosis not present

## 2018-11-16 IMAGING — CR DG ELBOW COMPLETE 3+V*L*
4 series · 4 of 4 positions shown · non-contrast
Comparison: None.

CLINICAL DATA: Initial evaluation for acute pain status post fall.

EXAM:
LEFT ELBOW - COMPLETE 3+ VIEW

[elbow obl (1 of 2)]
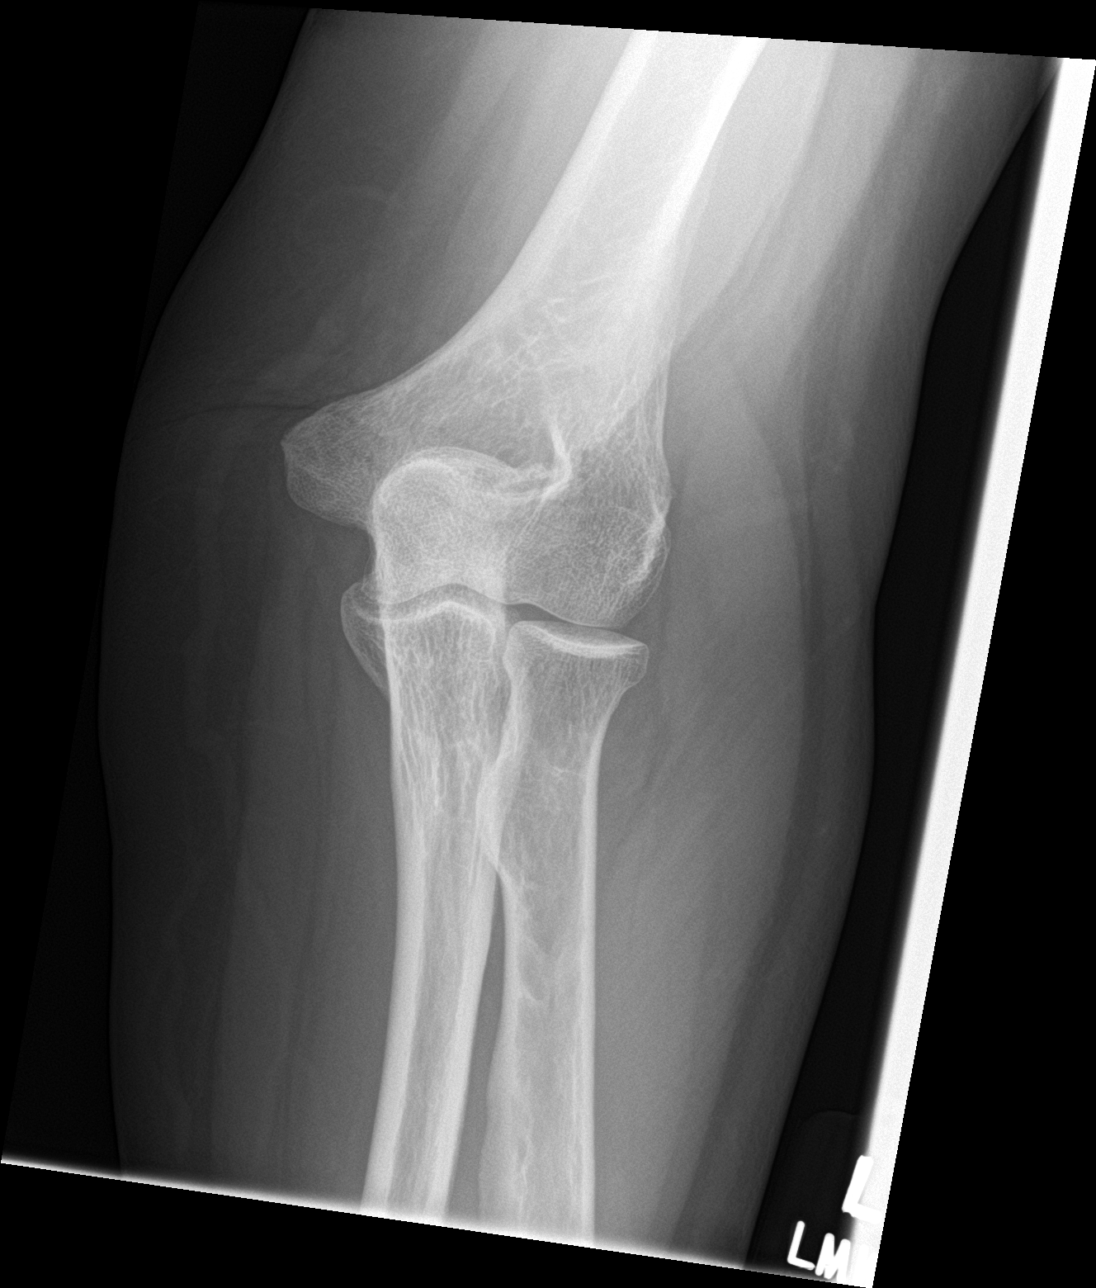

[elbow obl (2 of 2)]
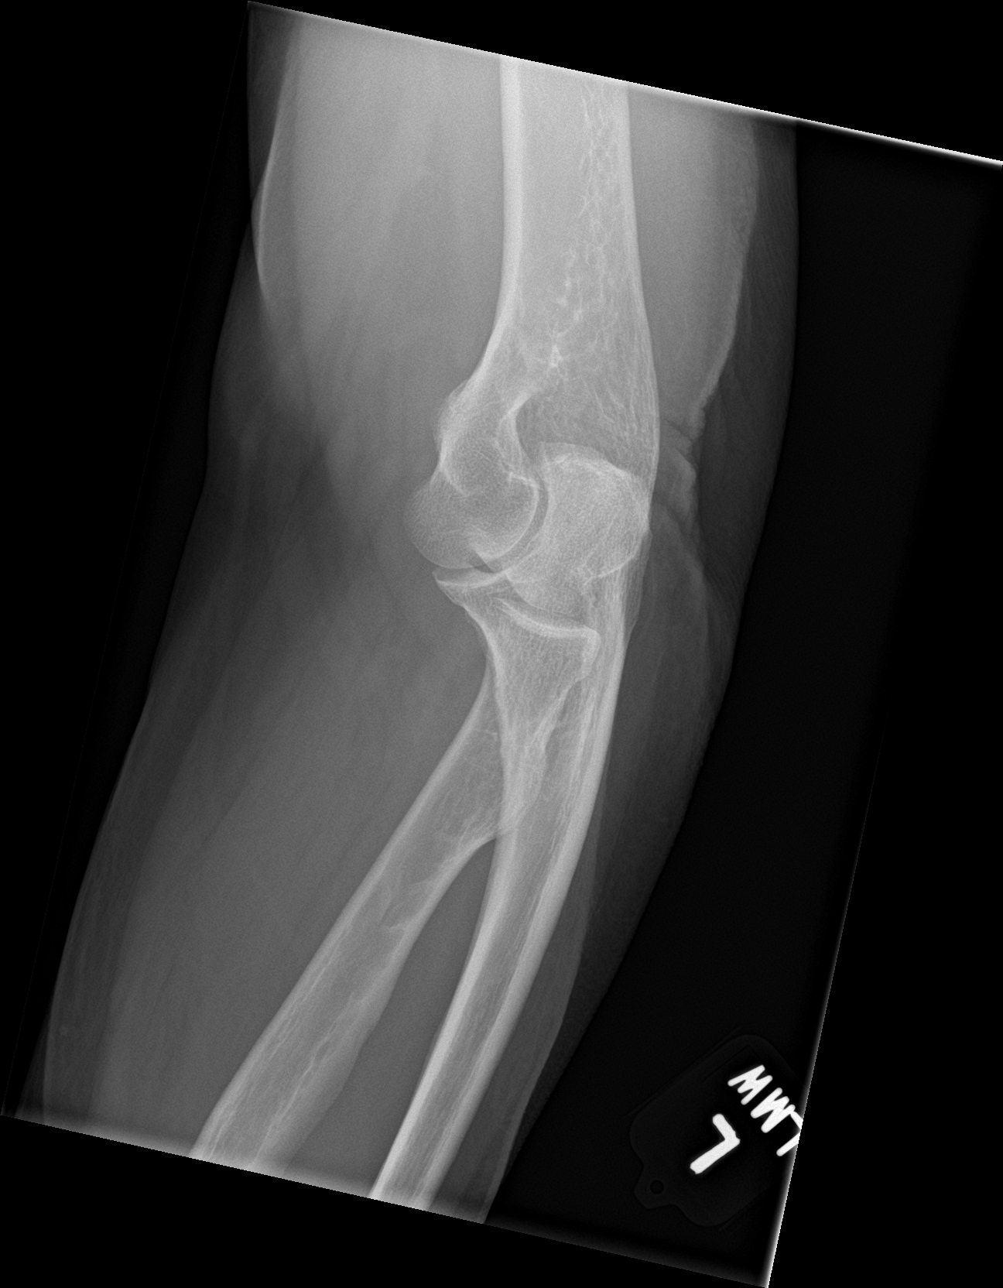

[elbow lat]
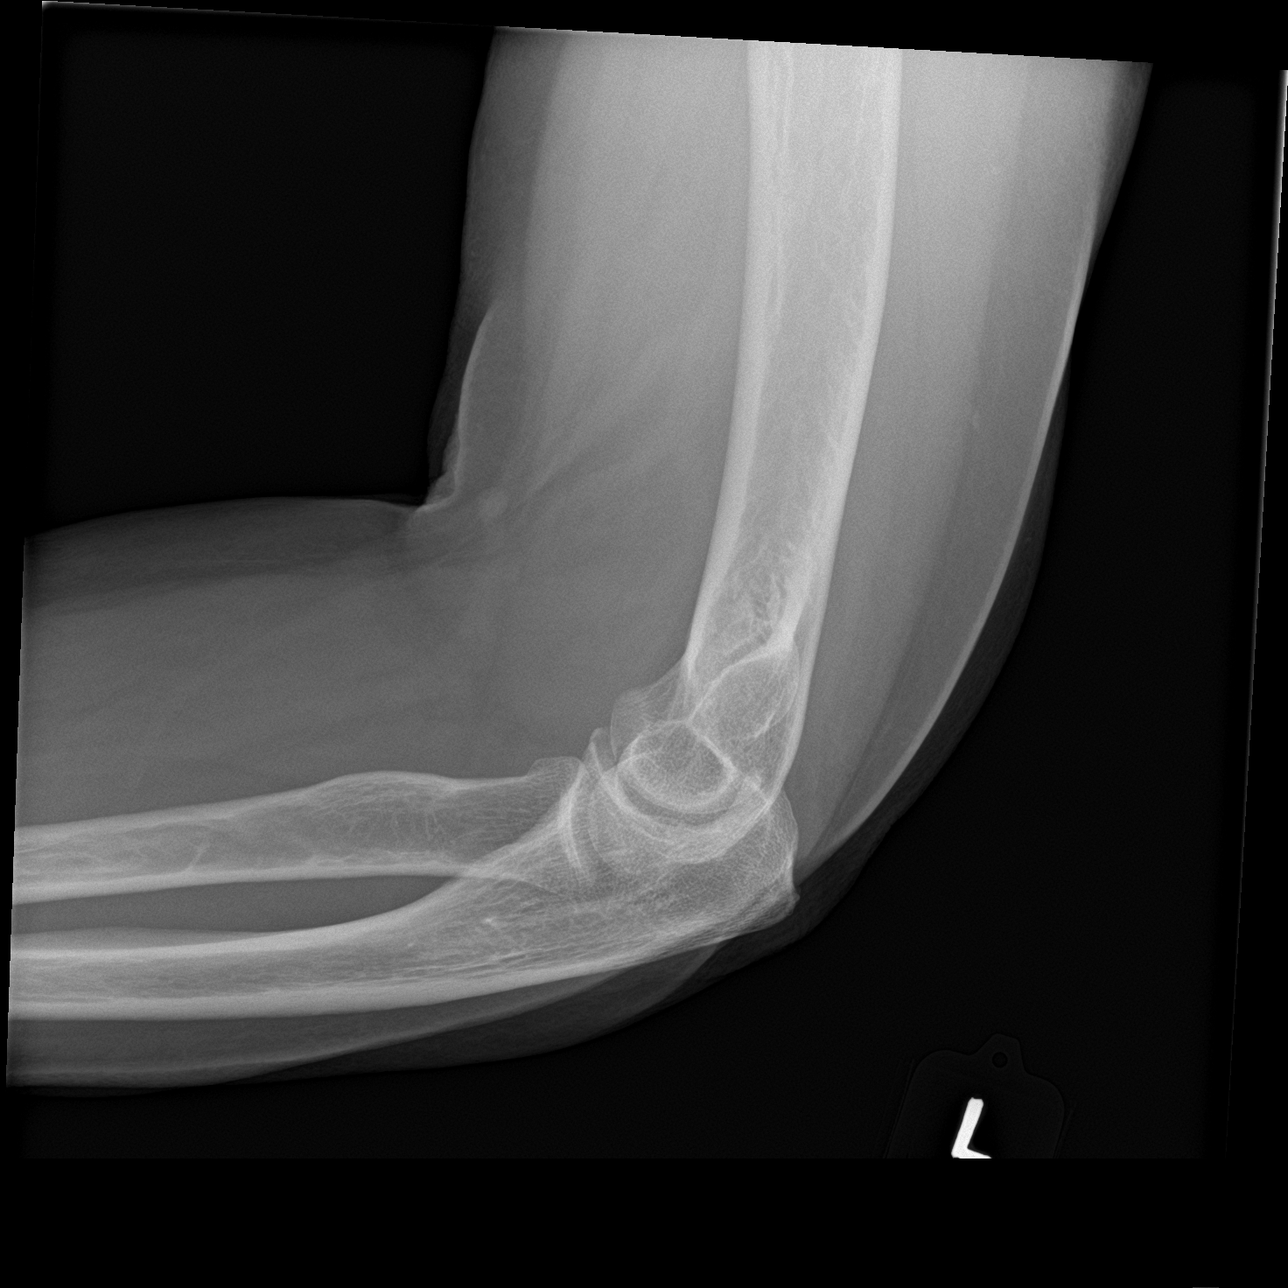

[elbow ap]
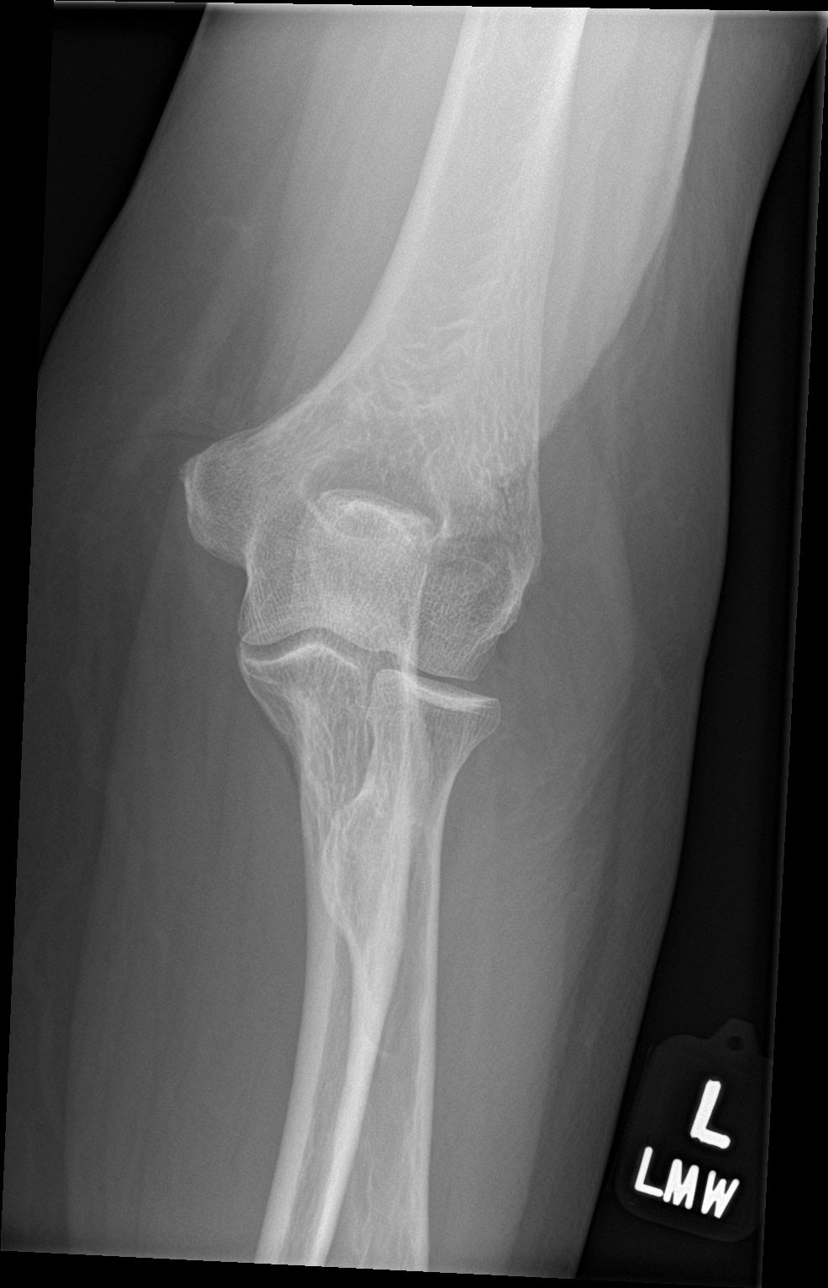

[4 of 4 positions shown; findings below may reference images not displayed]

FINDINGS: No acute fracture or dislocation. No joint effusion. Mild
degenerative spurring about the olecranon. Radial head intact. No
soft tissue abnormality.
IMPRESSION: No acute osseous abnormality about the elbow.

## 2018-11-16 IMAGING — CR DG HUMERUS 2V *L*
2 series · 2 of 2 positions shown · non-contrast
Comparison: None.

CLINICAL DATA: Initial evaluation for acute pain status post fall.

EXAM:
LEFT HUMERUS - 2+ VIEW

[humerus ap]
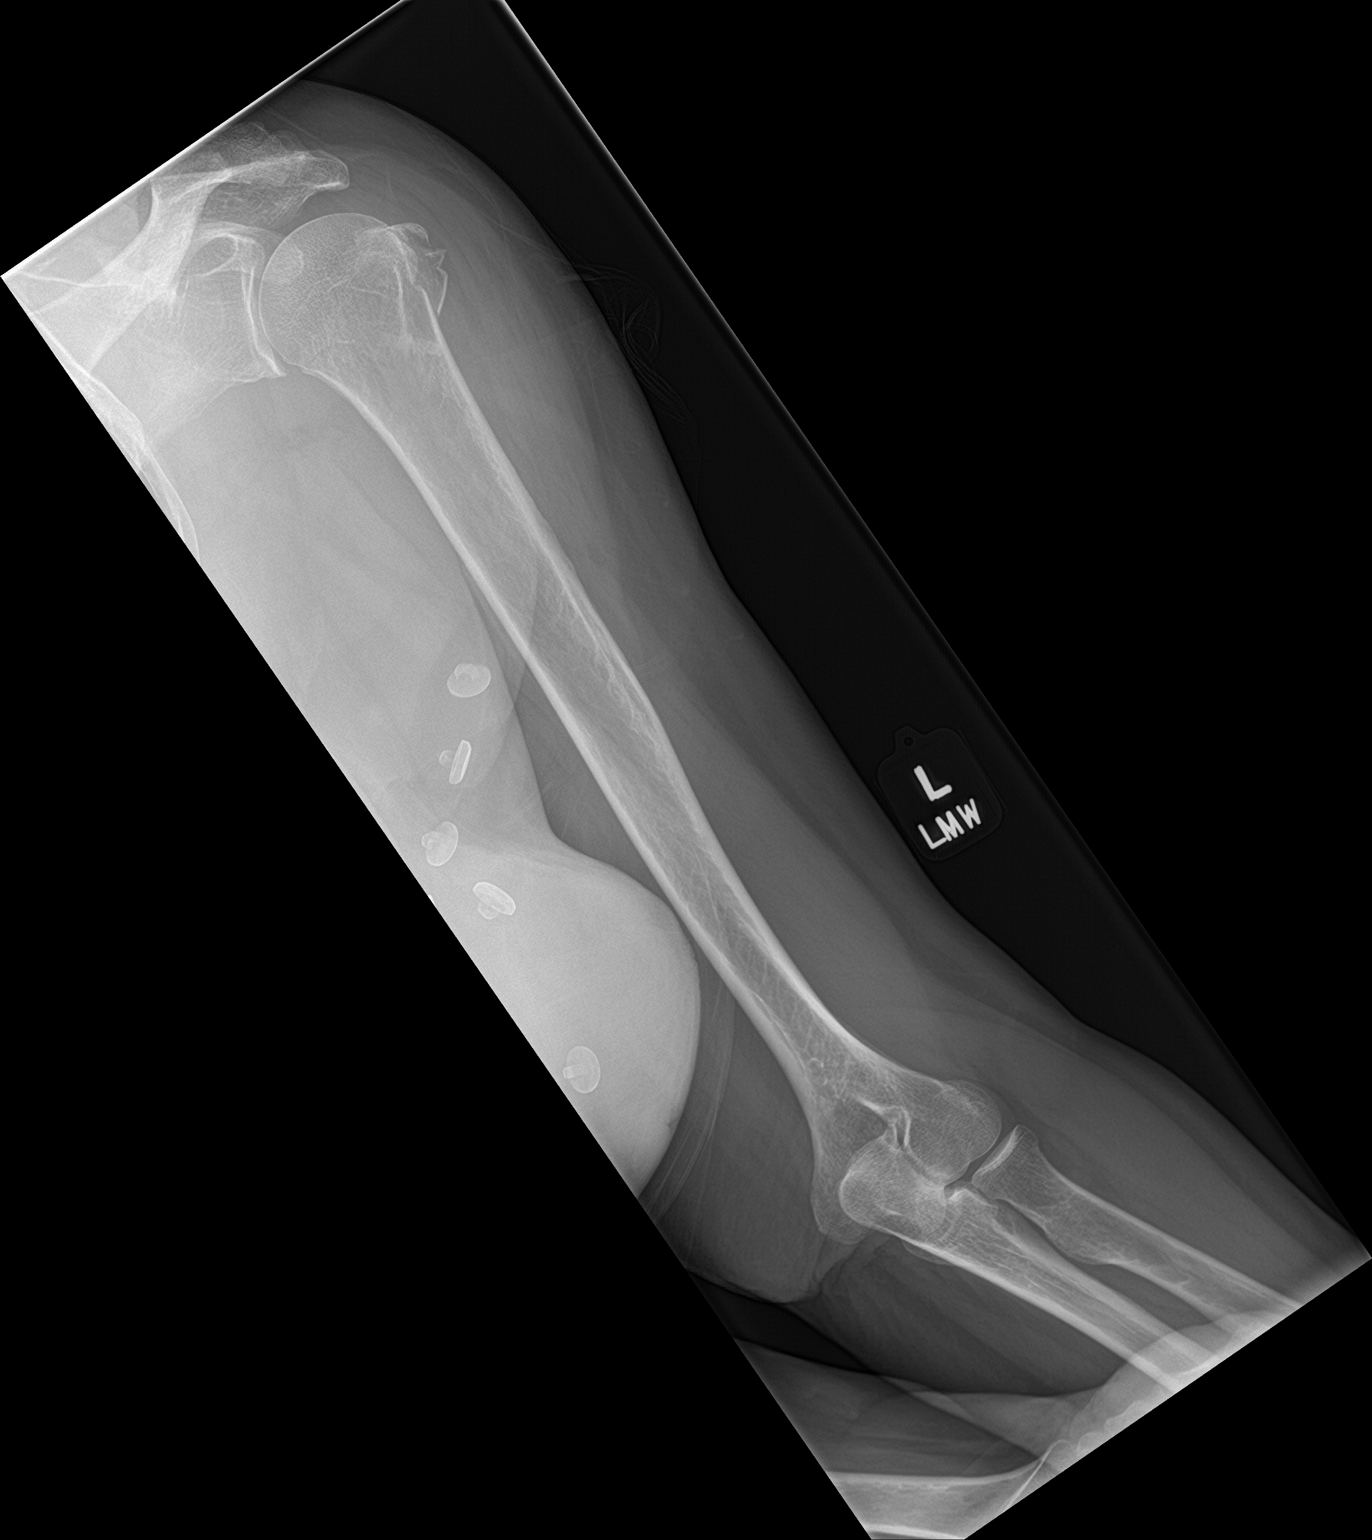

[humerus lat]
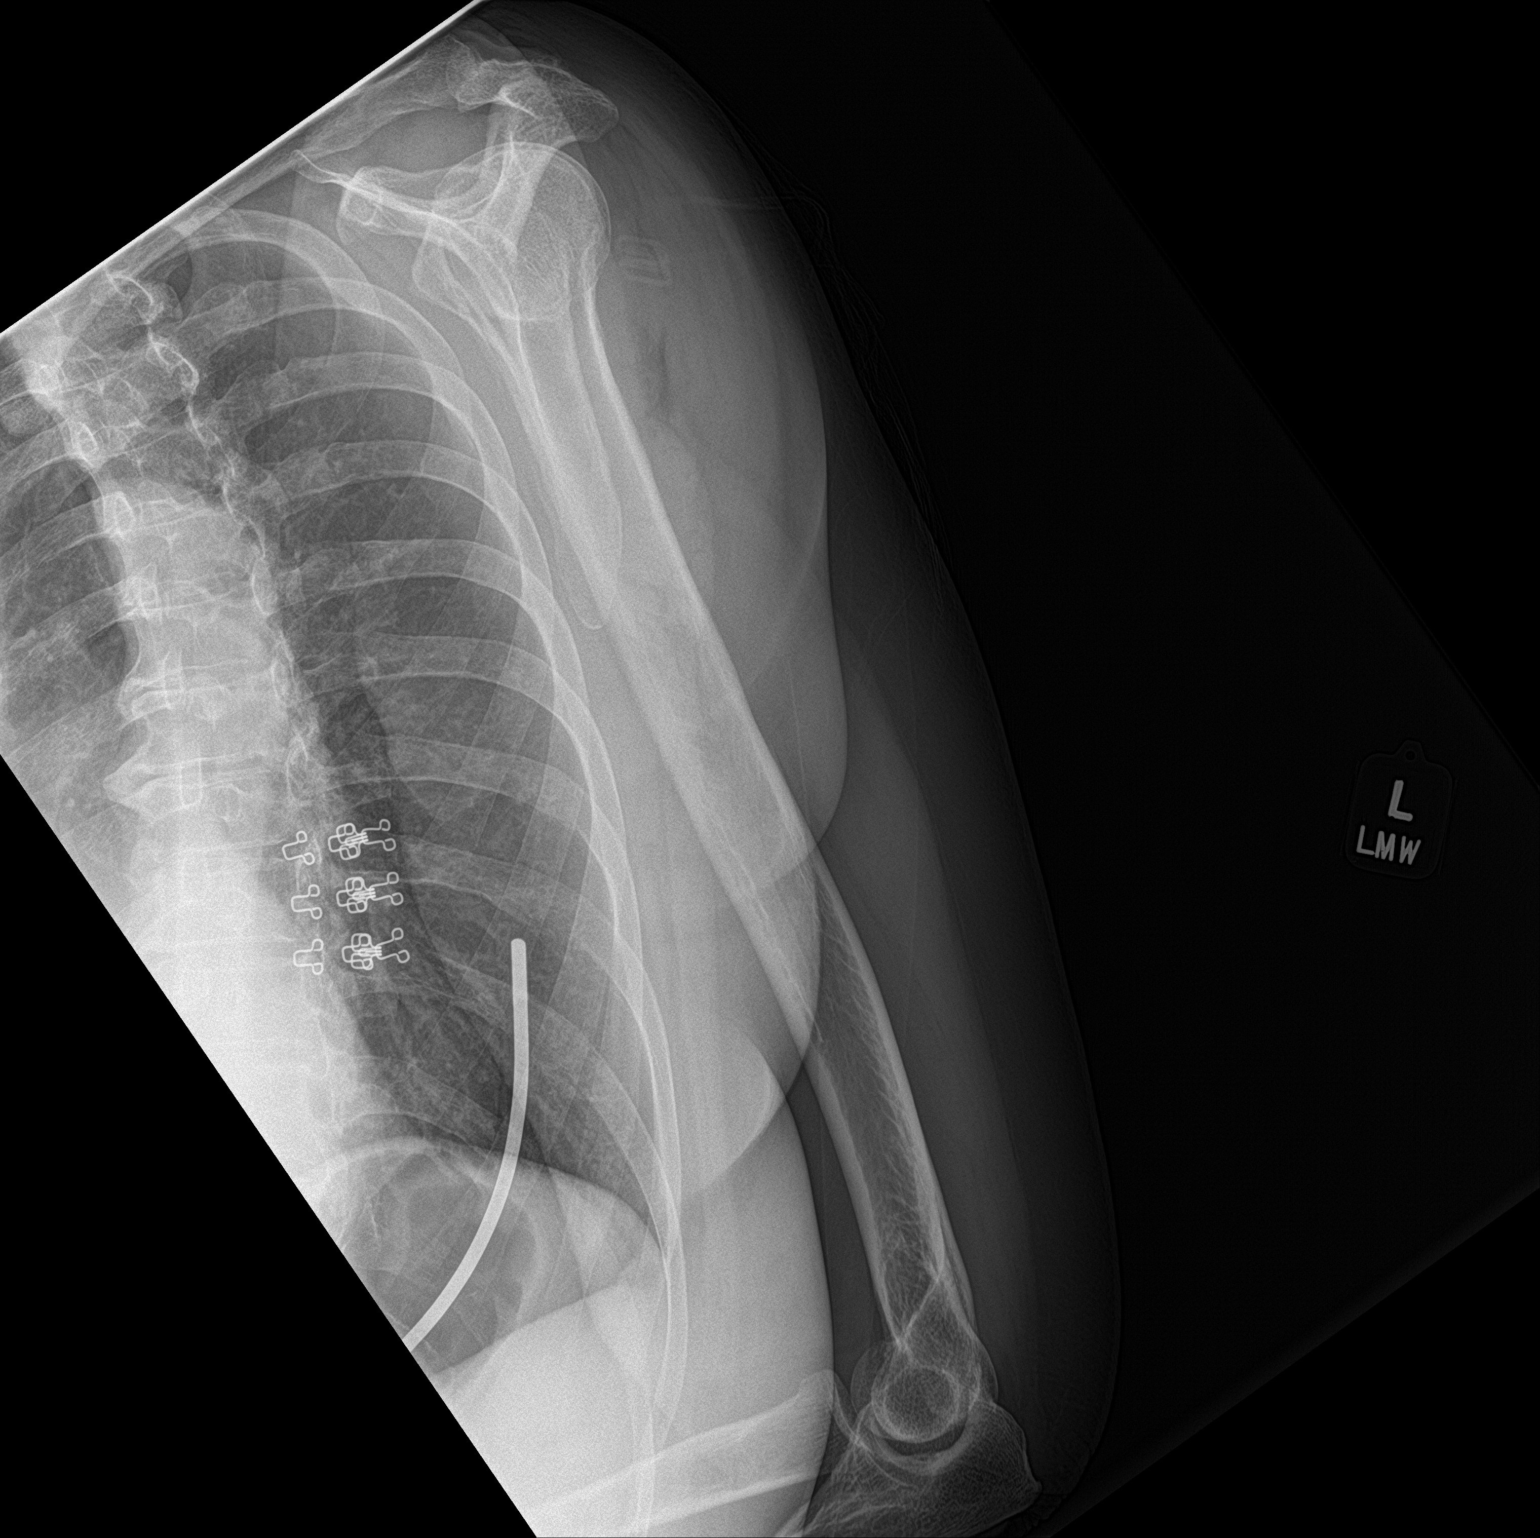

[2 of 2 positions shown; findings below may reference images not displayed]

FINDINGS: Acute nondisplaced fracture extends through the left humeral neck.
Comminution with associated extension through the greater
tuberosity. Remainder of the left humerus intact. Glenoid grossly
intact. Visualized left hemithorax clear. No acute soft tissue
abnormality.
IMPRESSION: Acute comminuted nondisplaced fracture through the left humeral neck
with extension through the greater tuberosity.

## 2018-11-16 IMAGING — CR DG FEMUR 2+V*R*
4 series · 4 of 4 positions shown · non-contrast
Comparison: None.

CLINICAL DATA: Initial evaluation for acute pain status post fall.

EXAM:
RIGHT FEMUR 2 VIEWS

[femur ap (1 of 2)]
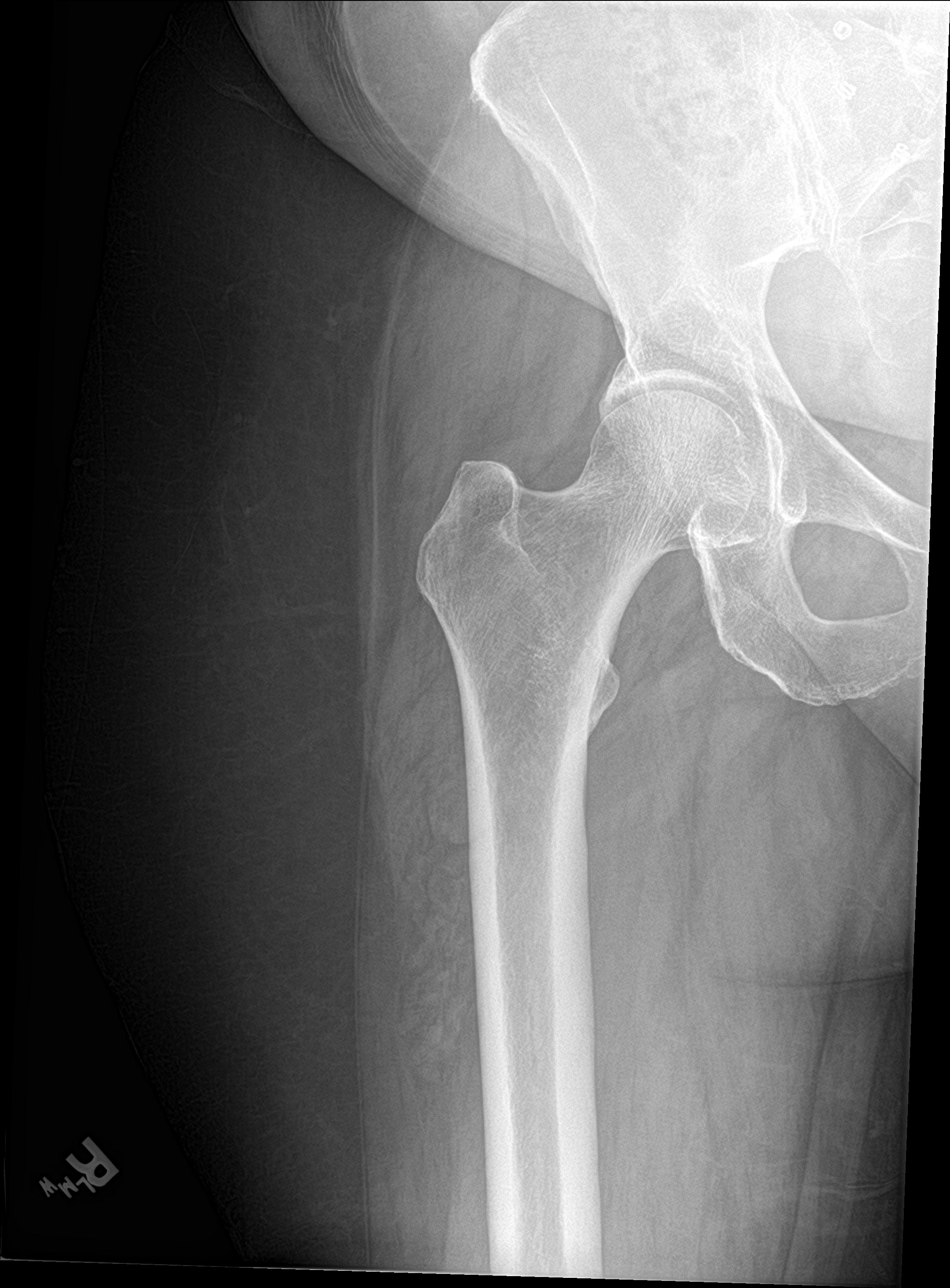

[femur ap (2 of 2)]
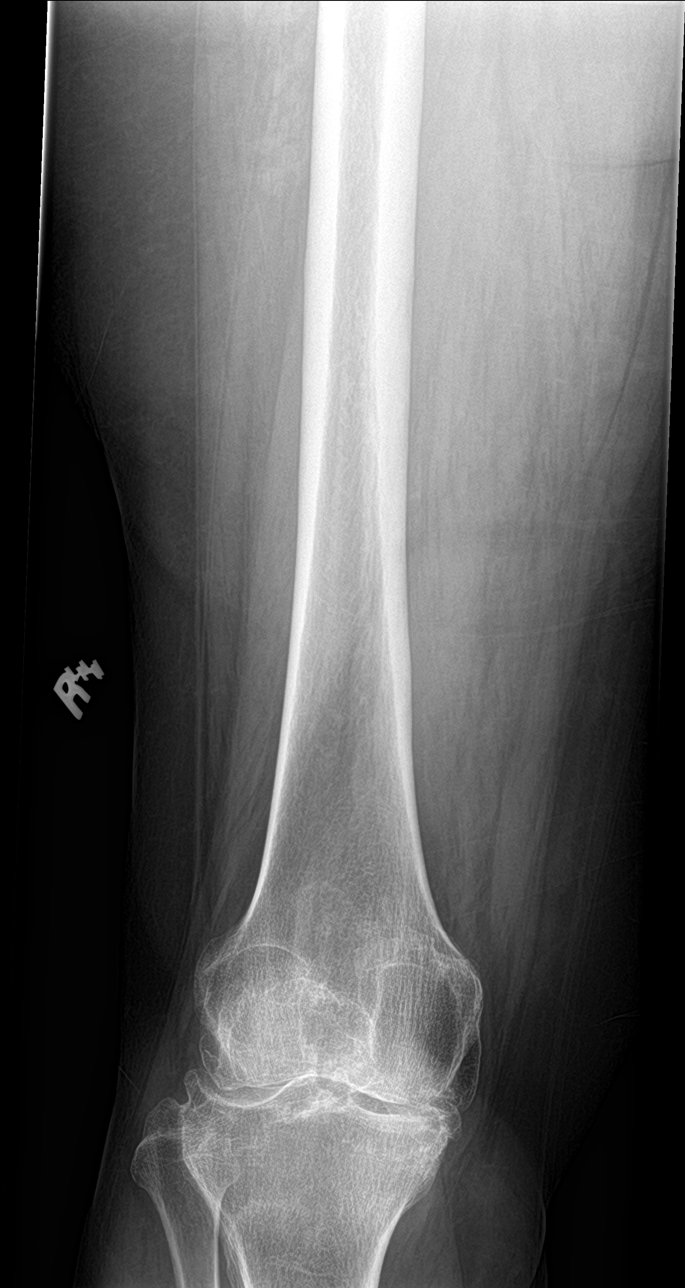

[femur lat (1 of 2)]
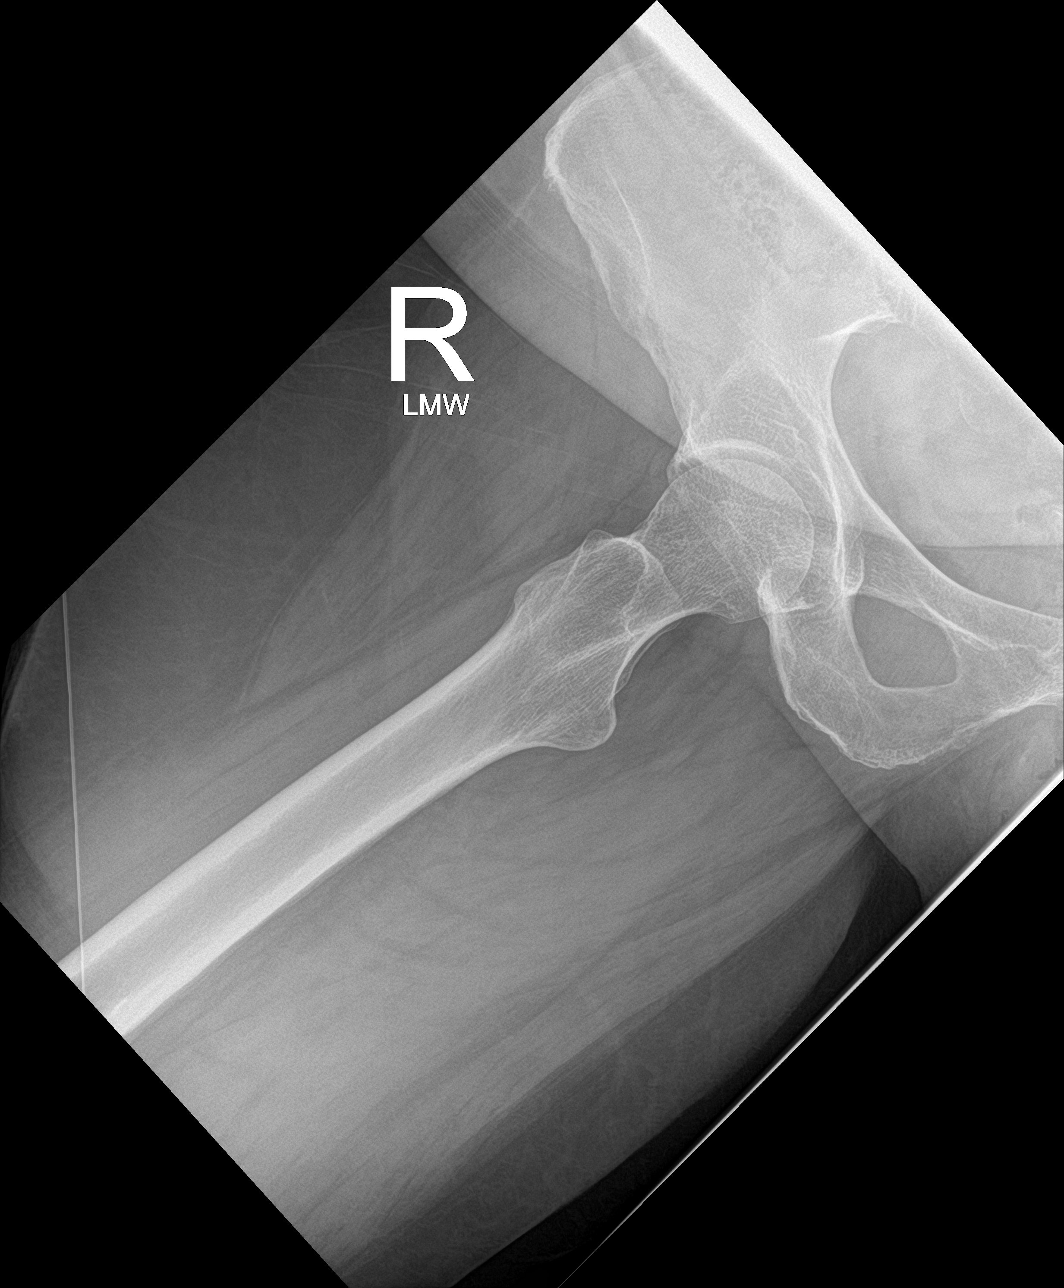

[femur lat (2 of 2)]
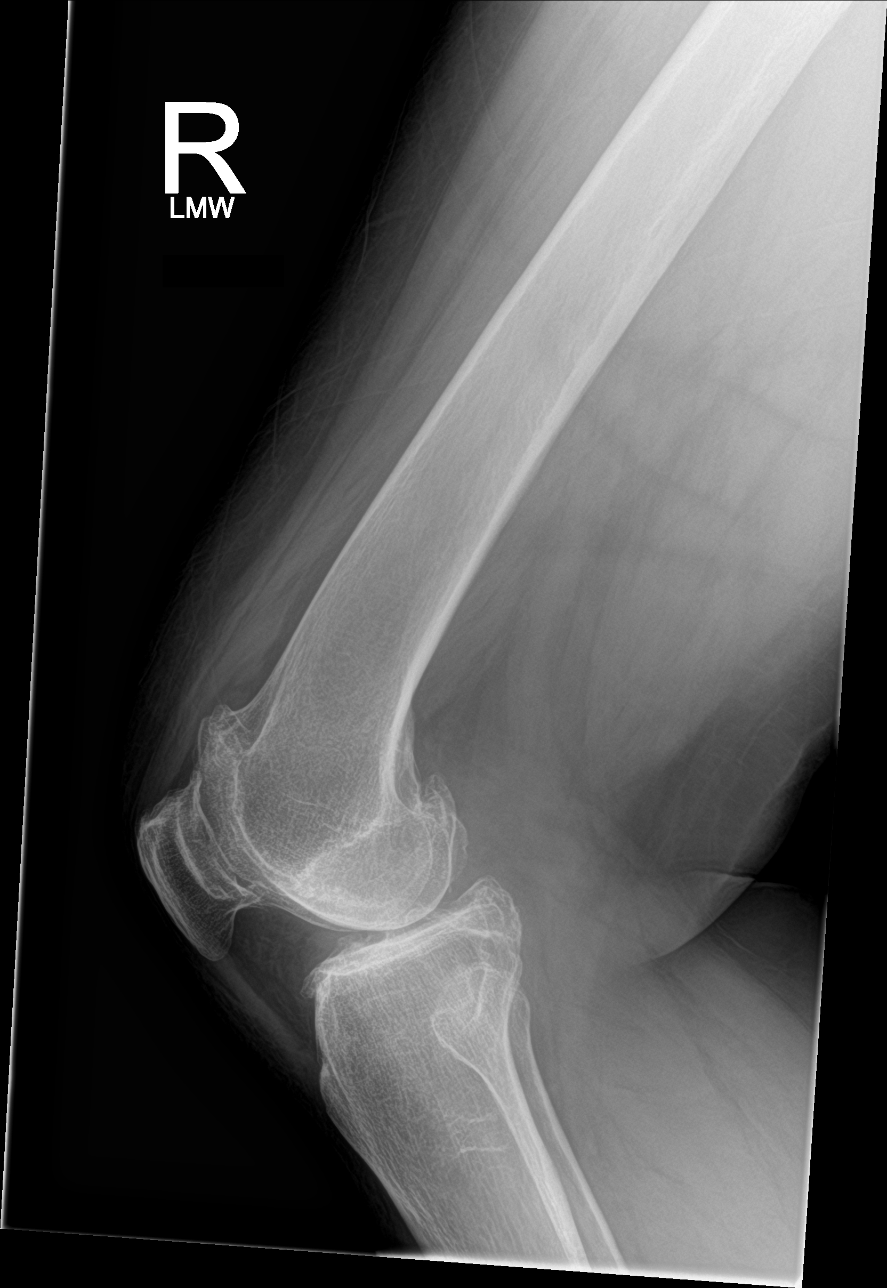

[4 of 4 positions shown; findings below may reference images not displayed]

FINDINGS: No acute fracture or dislocation. Prominent degenerative changes
noted about the hip and knee. No acute soft tissue abnormality.
IMPRESSION: No acute osseous abnormality about the right femur.

## 2018-11-16 IMAGING — CR DG ELBOW COMPLETE 3+V*L*
1 series · 1 of 1 positions shown · non-contrast
Comparison: None.

CLINICAL DATA: Initial evaluation for acute pain status post fall.

EXAM:
LEFT ELBOW - COMPLETE 3+ VIEW

[elbow obl]
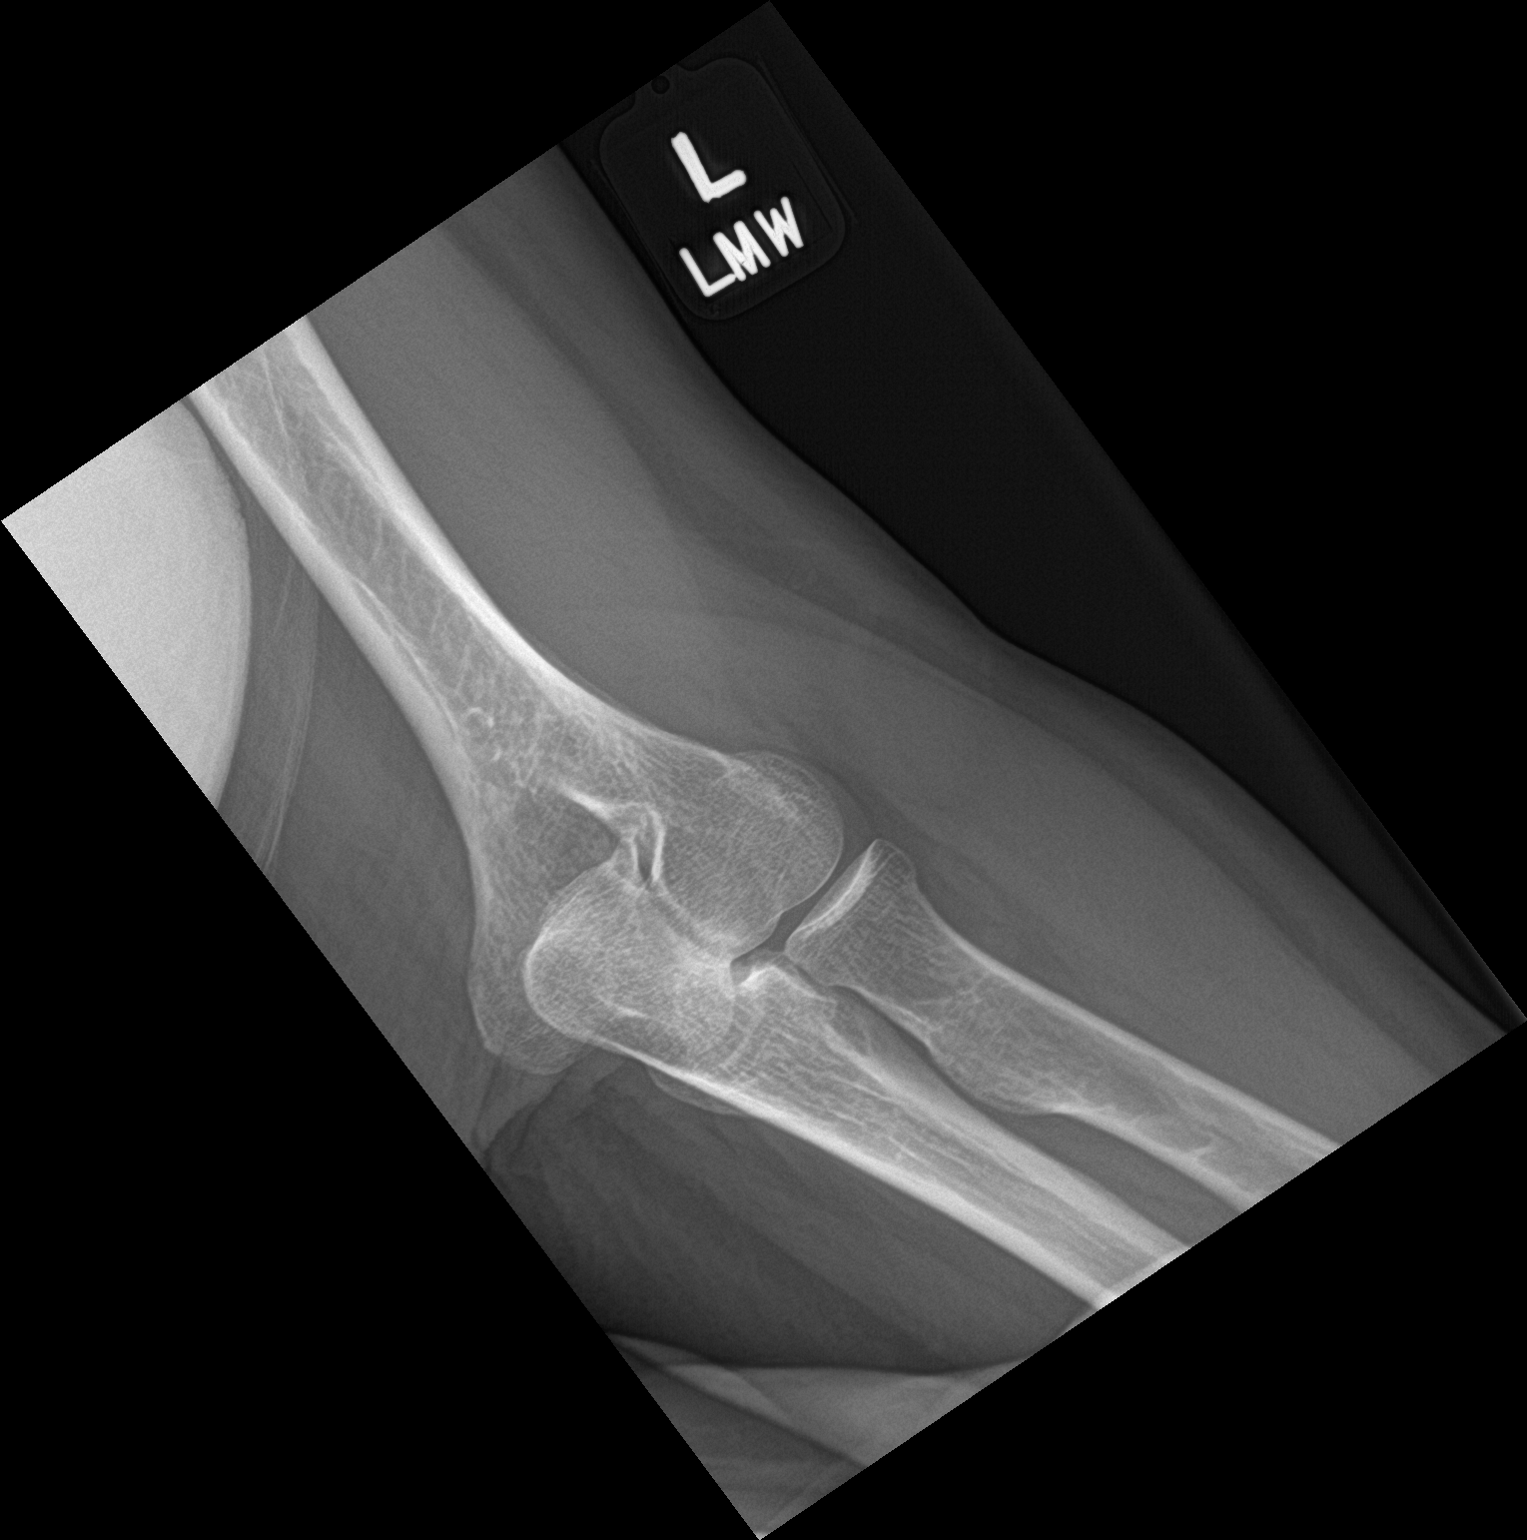

[1 of 1 positions shown; findings below may reference images not displayed]

FINDINGS: No acute fracture or dislocation. No joint effusion. Mild
degenerative spurring about the olecranon. Radial head intact. No
soft tissue abnormality.
IMPRESSION: No acute osseous abnormality about the elbow.

## 2018-11-16 MED ORDER — TRAMADOL HCL 50 MG PO TABS
50.0000 mg | ORAL_TABLET | Freq: Once | ORAL | Status: AC
  Administered 2018-11-16: 50 mg via ORAL
  Filled 2018-11-16: qty 1

## 2018-11-16 MED ORDER — IBUPROFEN 600 MG PO TABS
600.0000 mg | ORAL_TABLET | Freq: Once | ORAL | Status: AC
  Administered 2018-11-16: 600 mg via ORAL
  Filled 2018-11-16: qty 1

## 2018-11-16 MED ORDER — OXYCODONE HCL 5 MG PO TABS
5.0000 mg | ORAL_TABLET | Freq: Once | ORAL | Status: AC
  Administered 2018-11-16: 5 mg via ORAL
  Filled 2018-11-16: qty 1

## 2018-11-16 MED ORDER — OXYCODONE HCL 5 MG PO TABS: 5.0000 mg | ORAL_TABLET | Freq: Three times a day (TID) | ORAL | 0 refills | Status: AC | PRN

## 2018-11-16 MED ORDER — MELOXICAM 15 MG PO TABS: 15.0000 mg | ORAL_TABLET | Freq: Every day | ORAL | 0 refills | Status: DC

## 2018-11-16 NOTE — ED Provider Notes (Signed)
[facility] Emergency Department Provider Note ____________________________________________  Time seen: Approximately 8:15 PM  I have reviewed the triage vital signs and the nursing notes.   HISTORY  Chief Complaint Leg Pain and Arm Pain    HPI Kristen Hudson is a 52 y.o. female who presents to the emergency department for evaluation and treatment of left upper extremity, left lateral thorax pain, and right upper leg pain. She was taking her dog for a walk and had the leash wrapped around her arm. The dog saw something and "took off" before she could release the leash. She fell down 3 steps. She denies striking her head or loss of consciousness. No alleviating measures attempted prior to arrival. Patient is left-hand dominant.  Past Medical History:  Diagnosis Date  . Acid reflux   . Seizures [facility])     Patient Active Problem List   Diagnosis Date Noted  . Chronic pelvic pain in female 08/24/2018  . Status post bilateral salpingo-oophorectomy (BSO) 08/24/2018  . History of 3 cesarean sections 08/24/2018  . Vasomotor symptoms due to menopause 08/24/2018  . BMI 33.0-33.9,adult 08/24/2018  . Urinary urgency 08/24/2018  . Urinary frequency 08/24/2018    Past Surgical History:  Procedure Laterality Date  . APPENDECTOMY    . CESAREAN SECTION    . CHOLECYSTECTOMY    . COLON RESECTION    . OVARY SURGERY      Prior to Admission medications   Medication Sig Start Date End Date Taking? Authorizing Provider  ketorolac (TORADOL) 10 MG tablet Take 10 mg by mouth every 6 (six) hours as needed.    [provider]  levETIRAcetam (KEPPRA) 500 MG tablet Take by mouth. 12/16/17   [provider]  meloxicam (MOBIC) 15 MG tablet  10/04/18   [provider]  meloxicam (MOBIC) 15 MG tablet Take 1 tablet (15 mg total) by mouth daily. 11/16/18   Saki Legore B, FNP  oxyCODONE (ROXICODONE) 5 MG immediate release tablet Take 1 tablet (5 mg total)  by mouth every 8 (eight) hours as needed. 11/16/18 11/16/19  Treacy Holcomb, Johnette Abraham B, FNP  phenytoin (DILANTIN) 125 MG/5ML suspension Take 125 mg by mouth 3 (three) times daily.    [provider]    Allergies Penicillins  Family History  Problem Relation Age of Onset  . Breast cancer Mother   . Colon cancer Father   . Diabetes Father   . Ovarian cancer Neg Hx     Social History Social History   Tobacco Use  . Smoking status: Current Every Day Smoker    Packs/day: 0.25    Types: Cigarettes  . Smokeless tobacco: Never Used  Substance Use Topics  . Alcohol use: Not Currently    Frequency: Never  . Drug use: Not Currently    Review of Systems Constitutional: Negative for fever. Cardiovascular: Negative for chest pain. Respiratory: Negative for shortness of breath. Musculoskeletal: Positive for left arm, shoulder, wrist, thorax, and right femur. Skin: Positive for abrasions and contusions.  Neurological: Negative for decrease in sensation  ____________________________________________   PHYSICAL EXAM:  VITAL SIGNS: ED Triage Vitals  Enc Vitals Group     BP 11/16/18 1852 129/81     Pulse Rate 11/16/18 1852 (!) 102     Resp 11/16/18 1852 18     Temp 11/16/18 1852 98.5 F (36.9 C)     Temp Source 11/16/18 1852 Oral     SpO2 11/16/18 1852 100 %     Weight 11/16/18 1847 193  lb (87.5 kg)     Height 11/16/18 1847 5\' 4"  (1.626 m)     Head Circumference --      Peak Flow --      Pain Score 11/16/18 1847 10     Pain Loc --      Pain Edu? --      Excl. in [place]? --     Constitutional: Alert and oriented. Well appearing and in no acute distress. Eyes: Conjunctivae are clear without discharge or drainage Head: Atraumatic Neck: Supple. No midline tenderness on exam.  Respiratory: No cough. Respirations are even and unlabored. Musculoskeletal: Guarded on attempt to abduct the left shoulder due to pain. No obvious step-off or deformity; diffuse tenderness over the left  arm. Mild swelling without obvious deformity to the left wrist. Focal tenderness of the left lower rib area. Diffuse tenderness of the mid femur. FROM of the right hip, ankle, and knee. Neurologic: Awake, alert, and oriented x 4.  Skin: Abrasions to the left lateral thorax without active bleeding.  Psychiatric: Affect and behavior are appropriate.  ____________________________________________   LABS (all labs ordered are listed, but only abnormal results are displayed)  Labs Reviewed - No data to display ____________________________________________  RADIOLOGY  Image of the left humerus shows an acute comminuted nondisplaced fracture through the humeral neck with extension through the greater tuberosity.  Images of the left elbow and wrist are negative for acute bony abnormality per radiology. ____________________________________________   PROCEDURES  Procedures  ____________________________________________   INITIAL IMPRESSION / ASSESSMENT AND PLAN / ED COURSE  Kristen Hudson is a 52 y.o. who presents to the emergency department for treatment and evaluation of left upper extremity and right lower extremity pain as well as some left lower thoracic pain after mechanical, non-syncopal fall approximately an hour before arrival.  X-rays indicate an acute humeral head fracture on the left.  Symptoms and exam are consistent.  Patient is neurovascularly intact.  She will be placed in a sling, given meloxicam, and given Roxicet for extreme pain.  Patient was made aware that this medication may cause dizziness or drowsiness.  She will be given a referral to orthopedics and is instructed to call tomorrow to set her appointment.  Strict ER return precautions were given and the patient will return for any numbness, tingling, coldness in the left upper extremity.  She was advised to return to the emergency department for any other symptom of concern if she is unable to see her primary care provider or  the orthopedist.   Medications  oxyCODONE (Oxy IR/ROXICODONE) immediate release tablet 5 mg (has no administration in time range)  traMADol (ULTRAM) tablet 50 mg (50 mg Oral Given 11/16/18 2015)  ibuprofen (ADVIL,MOTRIN) tablet 600 mg (600 mg Oral Given 11/16/18 2015)    Pertinent labs & imaging results that were available during my care of the patient were reviewed by me and considered in my medical decision making (see chart for details).  _________________________________________   FINAL CLINICAL IMPRESSION(S) / ED DIAGNOSES  Final diagnoses:  Humeral head fracture, left, closed, initial encounter    ED Discharge Orders         Ordered    meloxicam (MOBIC) 15 MG tablet  Daily     11/16/18 2157    oxyCODONE (ROXICODONE) 5 MG immediate release tablet  Every 8 hours PRN     11/16/18 2157           If controlled substance prescribed during this visit, 12 month history  viewed on the [place] prior to issuing an initial prescription for Schedule II or III opiod.    Victorino Dike, FNP 11/16/18 2206    Orbie Pyo, MD 11/16/18 2256

## 2018-11-16 NOTE — ED Triage Notes (Signed)
First RN: states she fell on L arm and injured L arm and R leg. A&O, ambulatory.

## 2018-11-16 NOTE — ED Notes (Signed)
Pt complaining of pain to elbow, wrist, and femur when she fell.

## 2018-11-16 NOTE — Discharge Instructions (Signed)
Take meloxicam daily and take Roxicet when pain is severe. Call tomorrow to schedule an appointment with orthopedics. Return to the ER for symptoms of concern if unable to schedule an appointment.

## 2018-11-30 ENCOUNTER — Ambulatory Visit: Payer: Medicare HMO | Attending: Certified Nurse Midwife

## 2018-11-30 ENCOUNTER — Other Ambulatory Visit: Payer: Self-pay

## 2018-11-30 DIAGNOSIS — M62838 Other muscle spasm: Secondary | ICD-10-CM | POA: Insufficient documentation

## 2018-11-30 DIAGNOSIS — M533 Sacrococcygeal disorders, not elsewhere classified: Secondary | ICD-10-CM | POA: Diagnosis present

## 2018-11-30 DIAGNOSIS — R293 Abnormal posture: Secondary | ICD-10-CM | POA: Insufficient documentation

## 2018-11-30 DIAGNOSIS — R278 Other lack of coordination: Secondary | ICD-10-CM | POA: Diagnosis present

## 2018-11-30 DIAGNOSIS — M62830 Muscle spasm of back: Secondary | ICD-10-CM | POA: Diagnosis present

## 2018-11-30 NOTE — Patient Instructions (Signed)
Stabilization: Diaphragmatic Breathing    Lie with knees bent, feet flat. Place one hand on stomach, other on chest. Breathe deeply through nose, lifting belly hand without any motion of hand on chest.  Do this for at least 5 min. Per night, you cannot over-do it! Try using it when your pain is increasing to help stop the rise in pain.  * Put something about 2 pounds on your tummy and watch for it to rise as you breathe in, fall as you breathe out.

## 2018-11-30 NOTE — Therapy (Signed)
[place] MAIN [facility] SERVICES [address] [place], [place], [address] Phone: [phone]   Fax:  [phone]  Physical Therapy Evaluation  Patient Details  Name: Pari Lombard MRN: [id number] Date of Birth: 1966-12-11 No data recorded  Encounter Date: 11/30/2018    Past Medical History:  Diagnosis Date  . Acid reflux   . Seizures ([place])     Past Surgical History:  Procedure Laterality Date  . APPENDECTOMY    . CESAREAN SECTION    . CHOLECYSTECTOMY    . COLON RESECTION    . OVARY SURGERY      There were no vitals filed for this visit.       [facility] PT Assessment - 11/30/18 0001      Balance Screen   Has the patient fallen in the past 6 months  Yes    How many times?  2   fell in her basement and cracked a rib, dog pulled her down   Has the patient had a decrease in activity level because of a fear of falling?   Yes   Left arm is broken   Is the patient reluctant to leave their home because of a fear of falling?   No      Home Environment   Living Environment  Private residence    Living Arrangements  Other relatives   niece   Available Help at Discharge  Family    Type of [place] to enter    Entrance Stairs-Number of Steps  4    Entrance Stairs-Rails  Left    [place]  One level    [place]  None      Prior Function   Level of Independence  Needs assistance with homemaking    Meal Prep  Moderate    Laundry  Moderate    Vacuuming  Moderate    Light Housekeeping  Moderate    Gardening  Moderate    Yard Work  Moderate    Shopping  Moderate    Pet Care  Total    Driving  No    Vocation  On disability    Leisure  Like to get back to running   [place] daily when weather is good.     Cognition   Overall Cognitive Status  Within Functional Limits for tasks assessed         Pelvic Floor Physical Therapy Evaluation and Assessment  SCREENING  Falls in last 6 mo: Yes,  2    Patient's communication preference: Cell Phone [phone]  Red Flags:  Have you had any night sweats? Yes, ~ 2 months  Unexplained weight loss? no Saddle anesthesia? Has numbness around vulva and inner thighs Unexplained changes in bowel or bladder habits? no  SUBJECTIVE  Patient reports: Has pain in the RLQ that is worse when walking close to where her c-section scar is which started ~ 2 months ago. Has had pain with intercourse 10+ years   She is ready to try quitting smoking.  Precautions:  Seizure disorder brought on by stress and lack of sleep.  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  Recent Procedures/Tests/Findings:  Has had x-rays for L, reports 3 fractures,  arm but no surgery yet, will find out plan next week.  Obstetrical History: 3 c-sections youngest is 52 years old  Gynecological History: Had both ovaries removed due to cysts but uterus remains  Urinary History: Has urge incontinence   Drinks coffee in the morning. Drinking over 8 glasses per day, drinking ~ 1 coke every-other day (down from 4-5 per day)   Gastrointestinal History: Has a BM ~ 1 or less times per day, stool is hard and she has pain, has to sit for a long time and tries not to strain but does occasionally. Had diarrhea yesterday after drinking wafflehouse coffee  Sexual activity/pain: Has had pain for 10+ years.  Location of pain: Internally in the vaginal canal and RLQ Current pain:  9.5/10  Max pain:  10/10 Least pain:  4/10 Nature of pain: achy and sometimes sharp  Patient Goals: Wants the pain to go away without having surgery, it would be great if we could also help with bladder and bowel movements but that is not her primary concern.   OBJECTIVE  Posture/Observations:  Sitting:  Standing: "bow-legged", L hip Internally rotated. L hip slihtly higher with  forward fold, R PSIS high in standing,   Palpation/Segmental Motion/Joint Play: TTP through B Lumbar extensors (L>R), QL, Piriformis and Glute min.   Special tests:   Forward fold: no scoliosis noted but arm is in sling, making it difficult to assess.   Range of Motion/Flexibilty: Deferred to follow-up Spine: Hips:   Strength/MMT: Deferred to follow-up when L UE is not preventing. LE MMT  LE MMT Left Right  Hip flex:  (L2) /5 /5  Hip ext: /5 /5  Hip abd: /5 /5  Hip add: /5 /5  Hip IR /5 /5  Hip ER /5 /5     Abdominal:  Palpation: Pain with light pressure throughout entire abdomen with R side worse. Diastasis:  Pelvic Floor External Exam: Introitus Appears:  Skin integrity:  Palpation: TTP through all external PFM over clothes. Cough: Prolapse visible?: Scar mobility:  Internal Vaginal Exam: Deferred to follow-up appointment.  Strength (PERF):  Symmetry: Palpation: Prolapse:   Internal Rectal Exam: Deferred until deemed appropriate. Strength (PERF): Symmetry: Palpation: Prolapse:   Gait Analysis: Pt. Has ~ 4 ft. Sway width with AMB and conversation upon arrival, scissoring gait pattern.   Pelvic Floor Outcome Measures: Female NIH- CPSI: 53/70 (75%) VQ: 24/33 (73%)  INTERVENTIONS THIS SESSION: Self-care: Educated on the structure and function of the pelvic floor in relation to their symptoms as well as the POC, and initial HEP in order to set patient expectations and understanding from which we will build on in the future sessions. Educated on diaphragmatic breathing and the role it plays in pain management as well as for therapeutic interventions.   Total time: 60 min.         Objective measurements completed on examination: See above findings.                             Patient will benefit from skilled therapeutic intervention in order to improve the following deficits and impairments:     Visit Diagnosis: No  diagnosis found.     Problem List Patient Active Problem List   Diagnosis Date Noted  . Chronic pelvic pain in female 08/24/2018  . Status post bilateral salpingo-oophorectomy (BSO) 08/24/2018  . History of 3 cesarean sections 08/24/2018  . Vasomotor symptoms due to menopause 08/24/2018  . BMI 33.0-33.9,adult 08/24/2018  . Urinary urgency 08/24/2018  . Urinary frequency 08/24/2018   Willa Rough DPT, ATC Cyndra Numbers  [place] 11/30/2018, 11:29 AM  [place] MAIN [facility] SERVICES [address] [place], [place], [address] Phone: [phone]   Fax:  [phone]  Name: Syrah Daughtrey MRN: [id number] Date of Birth: March 25, 1966

## 2018-12-06 ENCOUNTER — Ambulatory Visit: Payer: Medicare HMO

## 2018-12-06 DIAGNOSIS — M62838 Other muscle spasm: Secondary | ICD-10-CM

## 2018-12-06 DIAGNOSIS — R278 Other lack of coordination: Secondary | ICD-10-CM

## 2018-12-06 DIAGNOSIS — R293 Abnormal posture: Secondary | ICD-10-CM

## 2018-12-06 DIAGNOSIS — M62830 Muscle spasm of back: Secondary | ICD-10-CM

## 2018-12-06 DIAGNOSIS — M533 Sacrococcygeal disorders, not elsewhere classified: Secondary | ICD-10-CM

## 2018-12-06 NOTE — Patient Instructions (Signed)
Self External Trigger Point Relief    1) Wash your hands and prop yourself up in a way where you can easily reach the vagina. You may wish to have a small hand-held mirror near by.  2) Use the 2 middle fingers to put gentle pressure on the three external pelvic floor muscles and hold pressure and take deep breaths as you allow the tension to release and discomfort to dissipate   3) Repeat the process for any trigger points you find spending between 3-10 minutes on this every 1-2 days until you do not find any more trigger points or you are told otherwise by your therapist.   **You can do this in a warm water bath as well to help you reach and relax better.

## 2018-12-06 NOTE — Therapy (Signed)
[place] MAIN [facility] SERVICES [address] [place], [place], [address] Phone: [phone]   Fax:  [phone]  Physical Therapy Treatment  Patient Details  Name: Kristen Hudson MRN: [id number] Date of Birth: 1966/08/11 No data recorded  Encounter Date: 12/06/2018  PT End of Session - 12/06/18 1110    Visit Number  2    Number of Visits  10    Date for PT Re-Evaluation  02/08/19    Authorization Type  Medicare    Authorization - Visit Number  2    Authorization - Number of Visits  10    PT Start Time  0940    PT Stop Time  1045    PT Time Calculation (min)  65 min    Activity Tolerance  Patient tolerated treatment well;Patient limited by pain;Treatment limited secondary to medical complications (Comment);Other (comment)    Behavior During Therapy  WFL for tasks assessed/performed       Past Medical History:  Diagnosis Date  . Acid reflux   . Seizures ([place])     Past Surgical History:  Procedure Laterality Date  . APPENDECTOMY    . CESAREAN SECTION    . CHOLECYSTECTOMY    . COLON RESECTION    . OVARY SURGERY      There were no vitals filed for this visit.    Pelvic Floor Physical Therapy Treatment Note  SCREENING  Changes in medications, allergies, or medical history?: no    SUBJECTIVE  Patient reports: She had an increase in leg pain when trying to do the breathing the other night, so much that she had to get out the alcohol to rub on her leg.She has really dark yellow urine.   Pain update:  Location of pain: Internally in the vaginal canal and RLQ Current pain: 8/10  Max pain: 10/10 Least pain: 4/10 Nature of pain:achy and sometimes sharp  Patient Goals: Wants the pain to go away without having surgery, it would be great if we could also help with bladder and bowel movements but that is not her primary concern.   OBJECTIVE  Changes in:  Pelvic Floor External Exam: Introitus Appears: elevated Skin  integrity: WNL Palpation: TTP through all external PFM over clothes. Cough: intact Prolapse visible?: no Scar mobility: N/A  Internal Vaginal Exam:  Strength (PERF): ? Symmetry: highly sensitive B Palpation: Unable to penetrate with single digit due to vaginismus and pain. Prolapse: N/A  Palpation: Extremely TTP through adductors B   INTERVENTIONS THIS SESSION: Manual: assessed PFM and performed TP release to L Bulbo, STP, and IC as well as educated on how to perform release at home to decrease sensitivity and spasm and allow for decreased pain. NM re-ed: continued to educate on diaphragmatic breathing to improve Pt's ability to relax and allow for successful se of TP release treatment and overall decreased sensitivity. Educated on pain science and how to use her brain to help re-program her body to interpret pain appropriately.    Total time: 65 min.                          PT Education - 12/06/18 1109    Education Details  See pt. Instructions and Interventions this session.    Person(s) Educated  Patient    Methods  Explanation;Verbal cues;Tactile cues;Handout    Comprehension  Verbalized understanding;Verbal cues required;Tactile cues required;Need further instruction;Returned demonstration       PT Short Term  Goals - 11/30/18 1849      PT SHORT TERM GOAL #1   Title  Patient will demonstrate improved pelvic alignment and balance of musculature surrounding the pelvis to facilitate decreased PFM spasms and decrease pelvic pain.    Baseline  Pt. demonstrates spasms surrounding pelvis, greater in R anteriorly and L postreiorly and through external PFM. R anterior rotation of innominate    Time  5    Period  Weeks    Status  New    Target Date  01/04/19      PT SHORT TERM GOAL #2   Title  Patient will demonstrate HEP x1 in the clinic to demonstrate understanding and proper form to allow for further improvement.    Baseline  Pt. lacks knowledge of  therapeutic exercises that can decrease her pain and disability.    Time  5    Period  Weeks    Status  New    Target Date  01/04/19      PT SHORT TERM GOAL #3   Title  Patient will demonstrate coordinated diaphragmatic breathing with pelvic tilts to demonstrate improved control of diaphragm and TA, to allow for further strengthening of core musculature and decreased pelvic floor spasm.    Baseline  Pt. demonstrates breathing dysfunction and poor TA recruitment as well as mild diastasis recti.    Time  5    Period  Weeks    Status  New    Target Date  01/04/19      PT SHORT TERM GOAL #4   Title  Patient will report a reduction in pain to no greater than 8/10 over the prior week to demonstrate symptom improvement.    Baseline  Max 10/10    Time  5    Period  Weeks    Status  New    Target Date  01/04/19      PT SHORT TERM GOAL #5   Title  Patient will report consistent use of foot-stool (squatty-potty) for positioning with BM to decrease pain with BM and intra-abdominal pressure.    Baseline  Pt. has a BM ~ 1 time per week and it is painful and takes an extended amount of time to pass.    Time  5    Period  Weeks    Status  New    Target Date  01/04/19        PT Long Term Goals - 11/30/18 1843      PT LONG TERM GOAL #1   Title  Patient will score less than or equal to 20% on the Female NIH-CPSI  and VQ to demonstrate a reduction in pain, urinary symptoms, and an improved quality of life.    Baseline  Female NIH- CPSI: 53/70 (75%) VQ: 24/33 (73%)    Time  10    Period  Weeks    Status  New    Target Date  02/08/19      PT LONG TERM GOAL #2   Title  Patient will report no episodes of UUI over the course of the prior two weeks to demonstrate improved functional ability.    Baseline  has leakage with urge     Time  10    Period  Weeks    Status  New    Target Date  02/08/19      PT LONG TERM GOAL #3   Title  Patient will describe pain no greater than 2/10 during  Sitting, standing or  walking for up to 90 min. to demonstrate improved functional ability.    Baseline  max pain 10/10, increases with <30 min. of sitting, standing, or walking.    Time  10    Period  Weeks    Status  New    Target Date  02/08/19      PT LONG TERM GOAL #4   Title  Patient will report no episodes of SUI over the course of the prior two weeks to demonstrate improved functional ability.    Baseline  unable to have intercourse due to pain    Time  10    Period  Weeks    Status  New    Target Date  02/08/19      PT LONG TERM GOAL #5   Title  Patient will report having BM's at least every-other day with consistency between [facility] stool scale 3-5 over the prior week to demonstrate decreased constipation.    Baseline  has a BM ~ 1 time per week and has pain and extended amount of time to pass with some straining.    Time  10    Period  Weeks    Status  New    Target Date  02/08/19            Plan - 12/06/18 1110    Clinical Impression Statement  Pt. responded well to education and treatment today, though she does appear to require extra repetition and practice to understand concepts. She is highly sensitive but was able to tolerate light TP release through external PFM on the R today and had ~ 60% success with proper breathing technique and will require further review. Pt. may benefit from further medical management in combination with PT to allow for optimal outcome. Continue per POC.    Clinical Presentation  Evolving    Clinical Decision Making  High    Rehab Potential  Good    Clinical Impairments Affecting Rehab Potential  persistent pain, low health literacy, obesity, but good motivation.    PT Frequency  1x / week    PT Duration  --   10 weeks   PT Treatment/Interventions  ADLs/Self Care Home Management;Aquatic Therapy;Biofeedback;Electrical Stimulation;Traction;Moist Heat;Gait training;Stair training;Functional mobility training;Therapeutic  activities;Therapeutic exercise;Balance training;Patient/family education;Cognitive remediation;Neuromuscular re-education;Manual techniques;Scar mobilization;Passive range of motion;Taping;Dry needling;Spinal Manipulations;Joint Manipulations    PT Next Visit Plan  further assess hip/spine ROM and leg-length and supine-to sit. mobilize sacrum and TP release vs DN to improve alignment.    PT Home Exercise Plan  Diaphragmatic breathing, self external TP release.    Recommended Other Services  vaginal valium/anti-anxietal and/or pain psychology    Consulted and Agree with Plan of Care  Patient       Patient will benefit from skilled therapeutic intervention in order to improve the following deficits and impairments:  Abnormal gait, Increased fascial restricitons, Impaired sensation, Improper body mechanics, Pain, Decreased coordination, Decreased mobility, Increased muscle spasms, Postural dysfunction, Decreased activity tolerance, Decreased endurance, Decreased range of motion, Decreased strength, Hypomobility, Decreased balance, Difficulty walking, Decreased safety awareness, Impaired flexibility, Obesity  Visit Diagnosis: Other muscle spasm  Muscle spasm of back  Sacrococcygeal disorders, not elsewhere classified  Abnormal posture  Other lack of coordination     Problem List Patient Active Problem List   Diagnosis Date Noted  . Chronic pelvic pain in female 08/24/2018  . Status post bilateral salpingo-oophorectomy (BSO) 08/24/2018  . History of 3 cesarean sections 08/24/2018  . Vasomotor symptoms due to menopause  08/24/2018  . BMI 33.0-33.9,adult 08/24/2018  . Urinary urgency 08/24/2018  . Urinary frequency 08/24/2018   Willa Rough DPT, ATC Willa Rough 12/06/2018, 11:16 AM  [place] MAIN [facility] SERVICES [place], [place], [address] Phone: [phone]   Fax:  [phone]  Name: Kristen Hudson MRN: [id number] Date of  Birth: 06/11/1966

## 2018-12-13 ENCOUNTER — Other Ambulatory Visit: Payer: Self-pay | Admitting: Certified Nurse Midwife

## 2018-12-13 ENCOUNTER — Ambulatory Visit: Payer: Medicare HMO

## 2018-12-13 MED ORDER — DIAZEPAM 5 MG PO TABS: 5.0000 mg | ORAL_TABLET | Freq: Four times a day (QID) | ORAL | 2 refills | Status: DC | PRN

## 2018-12-13 NOTE — Progress Notes (Signed)
Order placed for vaginal valium for vaginismus.   Philip Aspen, CNM

## 2018-12-14 ENCOUNTER — Telehealth: Payer: Self-pay

## 2018-12-14 NOTE — Telephone Encounter (Signed)
-----   Message from Philip Aspen, [place] sent at 12/13/2018  1:17 PM EST ----- Ivin Booty can you let Queena know that I order her vaginal valium . I placed the order to her pharmacy on file.   Thanks,  Philip Aspen, CNM

## 2018-12-14 NOTE — Telephone Encounter (Signed)
Informed pt of ATs orders. Expressed understanding.

## 2018-12-26 ENCOUNTER — Ambulatory Visit: Payer: Medicare HMO

## 2019-01-03 ENCOUNTER — Ambulatory Visit: Payer: Medicare HMO | Attending: Certified Nurse Midwife

## 2019-02-19 ENCOUNTER — Other Ambulatory Visit: Payer: Self-pay

## 2019-02-19 ENCOUNTER — Emergency Department
Admission: EM | Admit: 2019-02-19 | Discharge: 2019-02-19 | Disposition: A | Discharge status: Home / Self Care | Payer: Medicare HMO | Attending: Emergency Medicine | Admitting: Emergency Medicine

## 2019-02-19 DIAGNOSIS — R102 Pelvic and perineal pain: Secondary | ICD-10-CM

## 2019-02-19 DIAGNOSIS — F17210 Nicotine dependence, cigarettes, uncomplicated: Secondary | ICD-10-CM | POA: Diagnosis not present

## 2019-02-19 DIAGNOSIS — Z79899 Other long term (current) drug therapy: Secondary | ICD-10-CM | POA: Insufficient documentation

## 2019-02-19 LAB — URINALYSIS, ROUTINE W REFLEX MICROSCOPIC
Bilirubin Urine: NEGATIVE
GLUCOSE, UA: NEGATIVE mg/dL
Hgb urine dipstick: NEGATIVE
Ketones, ur: NEGATIVE mg/dL
Leukocytes,Ua: NEGATIVE
Nitrite: NEGATIVE
PH: 5 (ref 5.0–8.0)
PROTEIN: NEGATIVE mg/dL
Specific Gravity, Urine: 1.008 (ref 1.005–1.030)

## 2019-02-19 LAB — CBC
HCT: 39.8 % (ref 36.0–46.0)
HEMOGLOBIN: 13.6 g/dL (ref 12.0–15.0)
MCH: 30.4 pg (ref 26.0–34.0)
MCHC: 34.2 g/dL (ref 30.0–36.0)
MCV: 88.8 fL (ref 80.0–100.0)
Platelets: 281 10*3/uL (ref 150–400)
RBC: 4.48 MIL/uL (ref 3.87–5.11)
RDW: 12 % (ref 11.5–15.5)
WBC: 8.2 10*3/uL (ref 4.0–10.5)
nRBC: 0 % (ref 0.0–0.2)

## 2019-02-19 LAB — WET PREP, GENITAL
Clue Cells Wet Prep HPF POC: NONE SEEN
SPERM: NONE SEEN
Trich, Wet Prep: NONE SEEN
Yeast Wet Prep HPF POC: NONE SEEN

## 2019-02-19 LAB — BASIC METABOLIC PANEL
Anion gap: 8 (ref 5–15)
BUN: 20 mg/dL (ref 6–20)
CHLORIDE: 108 mmol/L (ref 98–111)
CO2: 23 mmol/L (ref 22–32)
CREATININE: 1.04 mg/dL — AB (ref 0.44–1.00)
Calcium: 8.5 mg/dL — ABNORMAL LOW (ref 8.9–10.3)
GFR calc Af Amer: >60 mL/min (ref >60)
GFR calc non Af Amer: >60 mL/min (ref >60)
GLUCOSE: 87 mg/dL (ref 70–99)
POTASSIUM: 4.2 mmol/L (ref 3.5–5.1)
Sodium: 139 mmol/L (ref 135–145)

## 2019-02-19 LAB — CHLAMYDIA/NGC RT PCR (ARMC ONLY)
Chlamydia Tr: NOT DETECTED
N GONORRHOEAE: NOT DETECTED

## 2019-02-19 LAB — PREGNANCY, URINE: Preg Test, Ur: NEGATIVE

## 2019-02-19 MED ORDER — TRAMADOL HCL 50 MG PO TABS: 50.0000 mg | ORAL_TABLET | Freq: Four times a day (QID) | ORAL | 0 refills | Status: DC | PRN

## 2019-02-19 NOTE — ED Triage Notes (Signed)
Pt states "I called my doctor and they're sending to [facility] next month on the 17th for my uterus" states uterus is "hanging and painful" c/o discharge but denies bleeding. A&O, no distress noted. In wheelchair.

## 2019-02-19 NOTE — ED Provider Notes (Signed)
[facility] Emergency Department Provider Note  Time seen: 3:29 PM  I have reviewed the triage vital signs and the nursing notes.   HISTORY  Chief Complaint Pelvic Pain    HPI Kristen Hudson is a 53 y.o. female with a past medical history of chronic pelvic pain, bilateral oophorectomy, urinary urgency, uterine prolapse, presents to the emergency department for lower abdominal/pelvic pain.  According to the patient for years now she has had pelvic pain, has had work-up performed by her PCP including imaging and ultrasounds of the pelvis and has ultimately been diagnosed with uterine prolapse.  Has an appointment 03/14/2019 with [facility] OB/[facility]N to discuss treatment options including hysterectomy per patient.  Patient states the pain was worse today so she came to the emergency department for evaluation.  Currently she states the pain has improved somewhat and is similar to her chronic discomfort she experiences.  Does have mild vaginal discharge but has not been sexually active in over 4 years per patient.  Denies any vaginal bleeding.  Denies any dysuria or hematuria.  Largely negative review of systems.   Past Medical History:  Diagnosis Date  . Acid reflux   . Seizures [facility])     Patient Active Problem List   Diagnosis Date Noted  . Chronic pelvic pain in female 08/24/2018  . Status post bilateral salpingo-oophorectomy (BSO) 08/24/2018  . History of 3 cesarean sections 08/24/2018  . Vasomotor symptoms due to menopause 08/24/2018  . BMI 33.0-33.9,adult 08/24/2018  . Urinary urgency 08/24/2018  . Urinary frequency 08/24/2018    Past Surgical History:  Procedure Laterality Date  . APPENDECTOMY    . CESAREAN SECTION    . CHOLECYSTECTOMY    . COLON RESECTION    . OVARY SURGERY      Prior to Admission medications   Medication Sig Start Date End Date Taking? Authorizing Provider  diazepam (VALIUM) 5 MG tablet Take 1 tablet (5 mg total) by mouth every 6 (six) hours  as needed (vaginal valium). 12/13/18   Philip Aspen, CNM  ketorolac (TORADOL) 10 MG tablet Take 10 mg by mouth every 6 (six) hours as needed.    [provider]  levETIRAcetam (KEPPRA) 500 MG tablet Take by mouth. 12/16/17   [provider]  meloxicam (MOBIC) 15 MG tablet  10/04/18   [provider]  meloxicam (MOBIC) 15 MG tablet Take 1 tablet (15 mg total) by mouth daily. 11/16/18   Triplett, Cari B, FNP  oxyCODONE (ROXICODONE) 5 MG immediate release tablet Take 1 tablet (5 mg total) by mouth every 8 (eight) hours as needed. 11/16/18 11/16/19  Triplett, Johnette Abraham B, FNP  phenytoin (DILANTIN) 125 MG/5ML suspension Take 125 mg by mouth 3 (three) times daily.    [provider]    Allergies  Allergen Reactions  . Penicillins     Family History  Problem Relation Age of Onset  . Breast cancer Mother   . Colon cancer Father   . Diabetes Father   . Ovarian cancer Neg Hx     Social History Social History   Tobacco Use  . Smoking status: Current Every Day Smoker    Packs/day: 0.25    Types: Cigarettes  . Smokeless tobacco: Never Used  Substance Use Topics  . Alcohol use: Not Currently    Frequency: Never  . Drug use: Not Currently    Review of Systems Constitutional: Negative for fever Cardiovascular: Negative for chest pain. Respiratory: Negative for shortness of breath. Gastrointestinal: Positive for  pelvic pain, chronic.  Negative for vomiting or diarrhea. Genitourinary: Negative for urinary compaints Musculoskeletal: Negative for musculoskeletal complaints Skin: Negative for skin complaints  Neurological: Negative for headache All other ROS negative  ____________________________________________   PHYSICAL EXAM:  VITAL SIGNS: ED Triage Vitals  Enc Vitals Group     BP 02/19/19 1318 101/73     Pulse Rate 02/19/19 1318 87     Resp 02/19/19 1318 18     Temp 02/19/19 1318 98.2 F (36.8 C)     Temp Source 02/19/19 1318 Oral     SpO2  02/19/19 1318 97 %     Weight 02/19/19 1319 193 lb (87.5 kg)     Height 02/19/19 1319 5\' 4"  (1.626 m)     Head Circumference --      Peak Flow --      Pain Score 02/19/19 1319 10     Pain Loc --      Pain Edu? --      Excl. in [place]? --    Constitutional: Alert and oriented. Well appearing and in no distress. Eyes: Normal exam ENT   Head: Normocephalic and atraumatic.   Mouth/Throat: Mucous membranes are moist. Cardiovascular: Normal rate, regular rhythm.  Respiratory: Normal respiratory effort without tachypnea nor retractions. Breath sounds are clear  Gastrointestinal: Soft and nontender. No distention.  Musculoskeletal: Nontender with normal range of motion in all extremities.  Neurologic:  Normal speech and language. No gross focal neurologic deficits  Skin:  Skin is warm, dry and intact.  Psychiatric: Mood and affect are normal  ____________________________________________   INITIAL IMPRESSION / ASSESSMENT AND PLAN / ED COURSE  Pertinent labs & imaging results that were available during my care of the patient were reviewed by me and considered in my medical decision making (see chart for details).  Patient presents to the emergency department for lower abdominal/pelvic pain, somewhat chronic but worse over the past several days per patient.  States mild amount of discharge as well.  Differential would include uterine prolapse, chronic pelvic pain.  Patient has had extensive work-up per patient by her primary care doctor and has been diagnosed with uterine prolapse, has an appointment with OB/GYN in approximately 3 weeks at [facility].  Patient's lab work is reassuring.  Urinalysis pending.  We will perform a pelvic exam.  I reviewed patient's records, has been complaining of pelvic pain for quite some time.  We will consider a short course of pain medication and have the patient follow-up with [facility] OB[facility] as soon as possible, if the remainder of the work-up is  nonrevealing.  Patient's work-up is reassuring.  Urine is negative, lab work is normal.  Pelvic examination shows no obvious prolapse at this time.  No discharge, swab sent as a precaution.  We will discharge the patient home with OB/GYN follow-up.  We will discharge with a short course of tramadol for pain control I also discussed ibuprofen and Tylenol.  ____________________________________________   FINAL CLINICAL IMPRESSION(S) / ED DIAGNOSES  Pelvic pain   Harvest Dark, MD 02/19/19 [phone]

## 2019-05-01 ENCOUNTER — Telehealth: Payer: Self-pay | Admitting: Obstetrics and Gynecology

## 2019-05-01 DIAGNOSIS — R569 Unspecified convulsions: Secondary | ICD-10-CM | POA: Diagnosis not present

## 2019-05-01 DIAGNOSIS — R32 Unspecified urinary incontinence: Secondary | ICD-10-CM | POA: Diagnosis not present

## 2019-05-01 DIAGNOSIS — N814 Uterovaginal prolapse, unspecified: Secondary | ICD-10-CM | POA: Diagnosis not present

## 2019-05-01 DIAGNOSIS — R911 Solitary pulmonary nodule: Secondary | ICD-10-CM | POA: Diagnosis not present

## 2019-05-01 NOTE — Telephone Encounter (Signed)
The patient called and stated that her surgery has been canceled and has been in a lot of pain that she is not able to handle. The patient is requesting to speak with a nurse. Please advise.

## 2019-05-01 NOTE — Telephone Encounter (Signed)
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 I do not see that she had surgery scheduled.

## 2019-05-04 DIAGNOSIS — N9489 Other specified conditions associated with female genital organs and menstrual cycle: Secondary | ICD-10-CM | POA: Diagnosis not present

## 2019-05-04 DIAGNOSIS — R102 Pelvic and perineal pain: Secondary | ICD-10-CM | POA: Diagnosis not present

## 2019-05-04 DIAGNOSIS — Z90722 Acquired absence of ovaries, bilateral: Secondary | ICD-10-CM | POA: Diagnosis not present

## 2019-05-04 DIAGNOSIS — Z98891 History of uterine scar from previous surgery: Secondary | ICD-10-CM | POA: Diagnosis not present

## 2019-05-04 DIAGNOSIS — G890 Central pain syndrome: Secondary | ICD-10-CM | POA: Diagnosis not present

## 2019-05-09 IMAGING — US US TRANSVAGINAL NON-OB
1 series · 14 of 25 positions shown · non-contrast
Comparison: None

CLINICAL DATA: Pelvic pain for 1 month.  Ovaries have been removed.

EXAM:
TRANSABDOMINAL AND TRANSVAGINAL ULTRASOUND OF PELVIS
TECHNIQUE: Both transabdominal and transvaginal ultrasound examinations of the
pelvis were performed. Transabdominal technique was performed for
global imaging of the pelvis including uterus, ovaries, adnexal
regions, and pelvic cul-de-sac. It was necessary to proceed with
endovaginal exam following the transabdominal exam to visualize the
endometrial complex and adnexal structures to an adequate degree.

[Series 1: us transvaginal non-ob · 0.19mm/px · 14 of 67 slices shown]
[im 1/67]
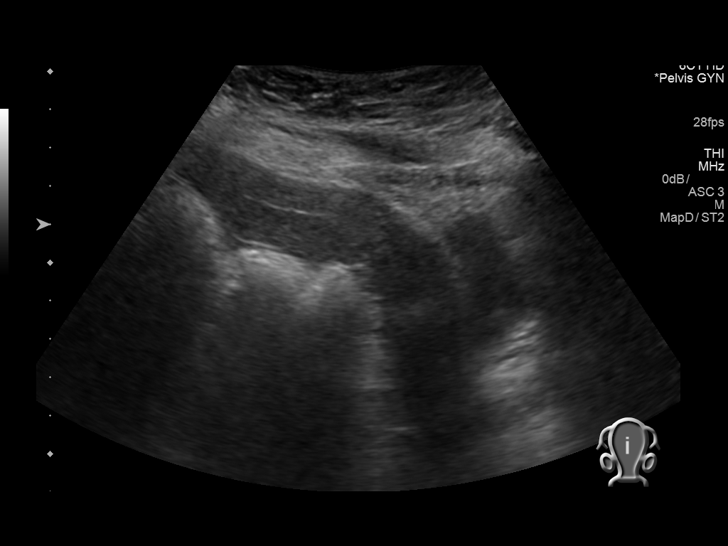
[im 6/67]
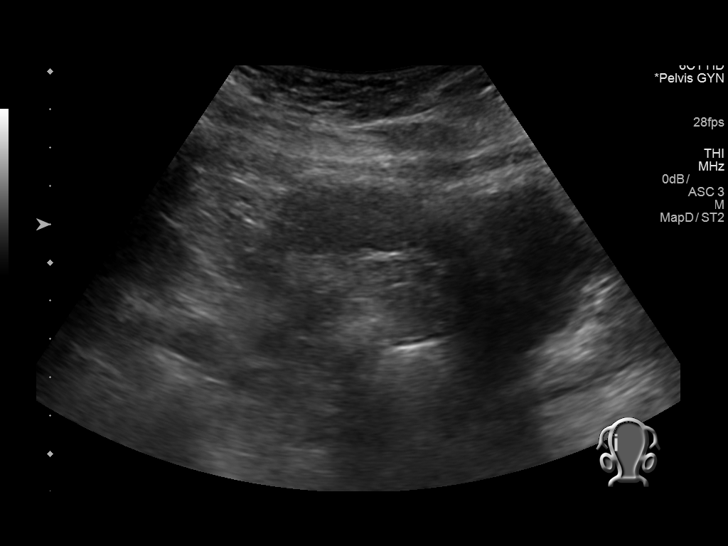
[im 12/67]
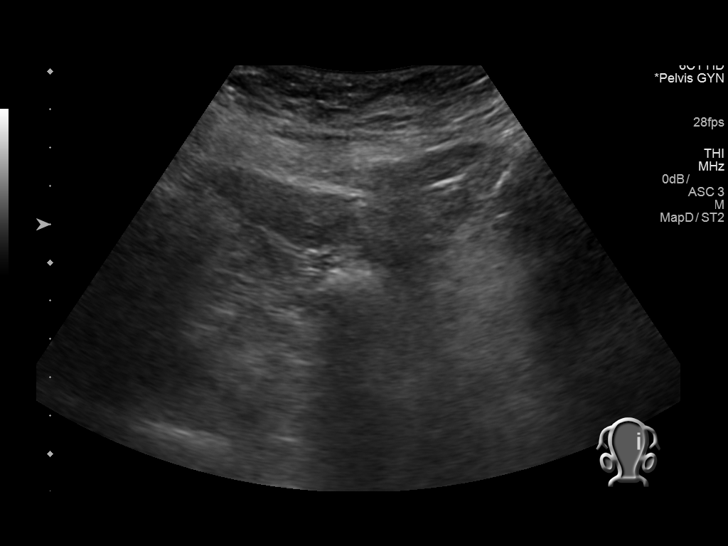
[im 17/67]
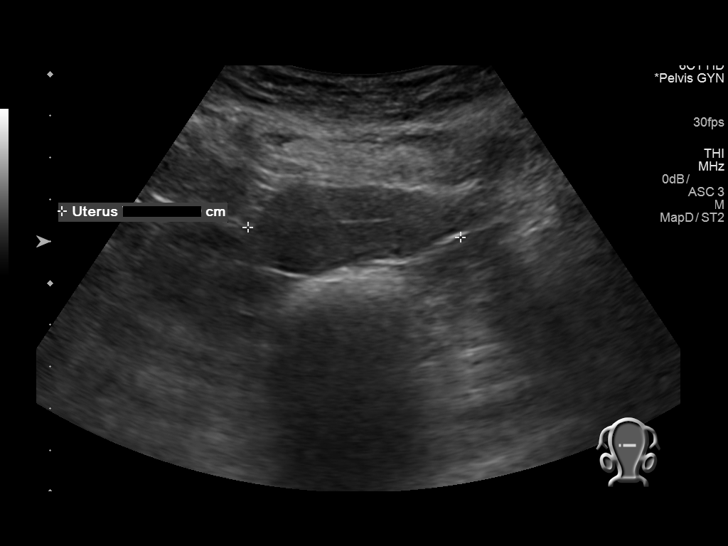
[im 23/67]
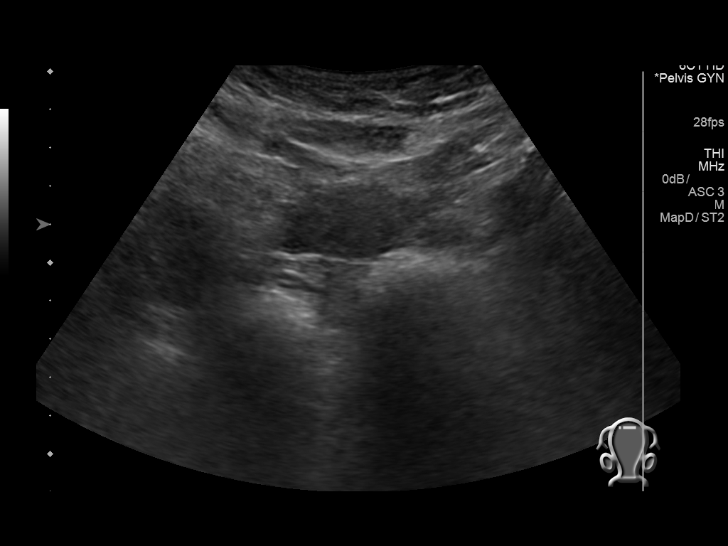
[im 25/67]
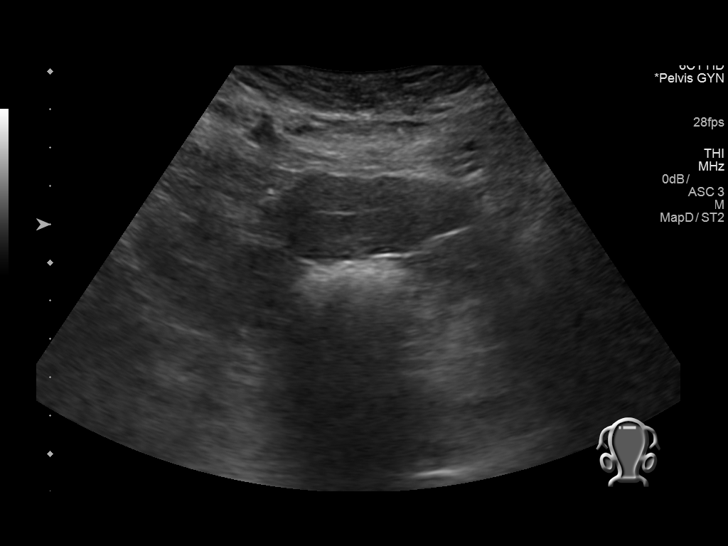
[im 31/67]
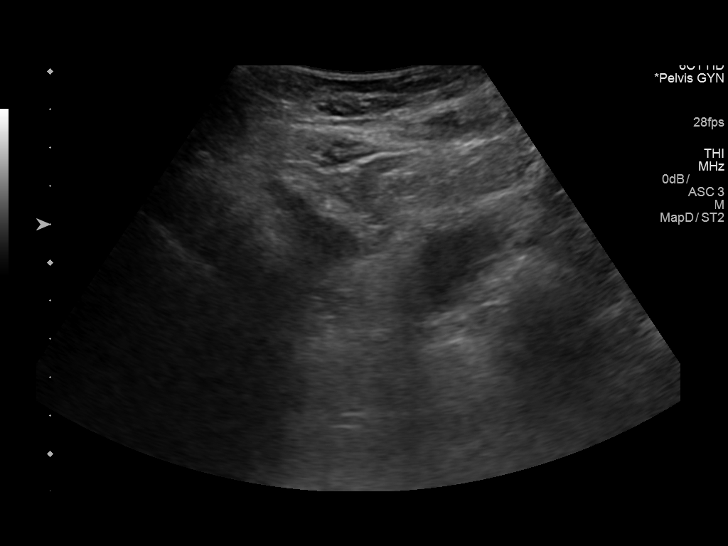
[im 36/67]
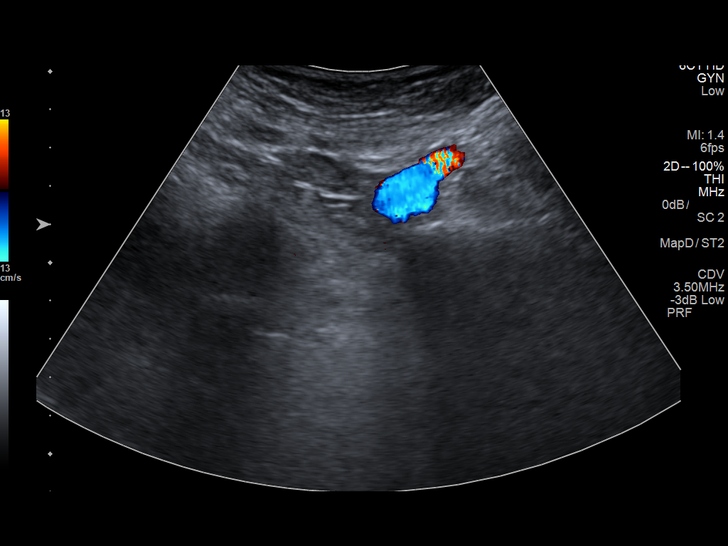
[im 42/67]
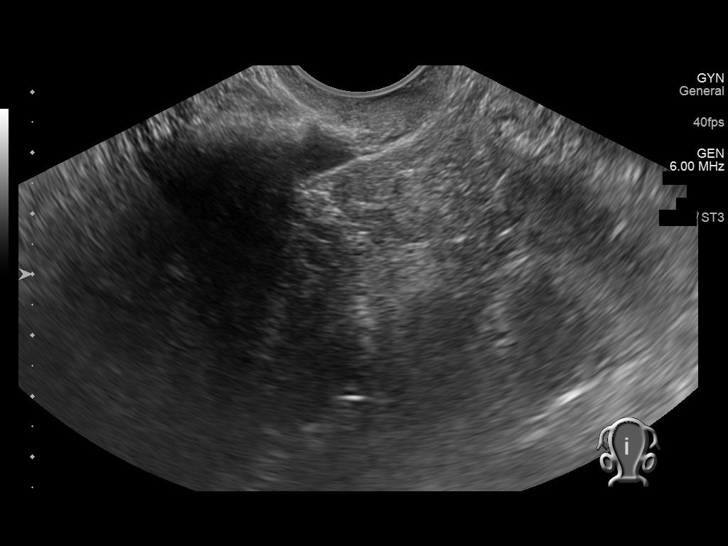
[im 45/67]
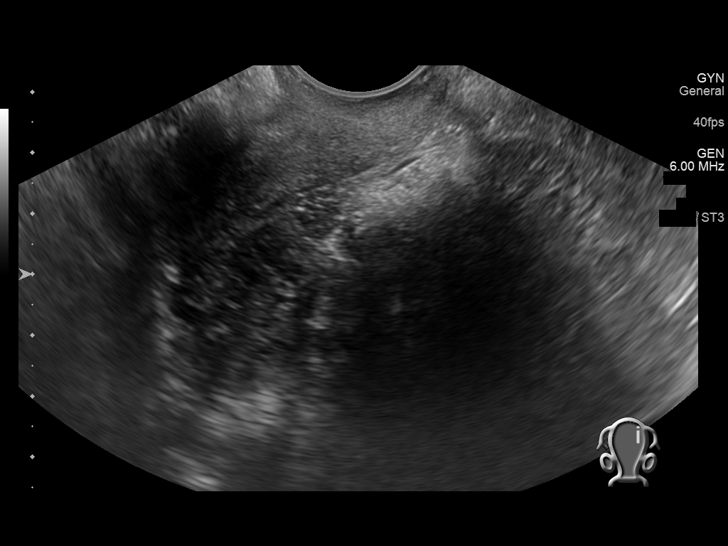
[im 50/67]
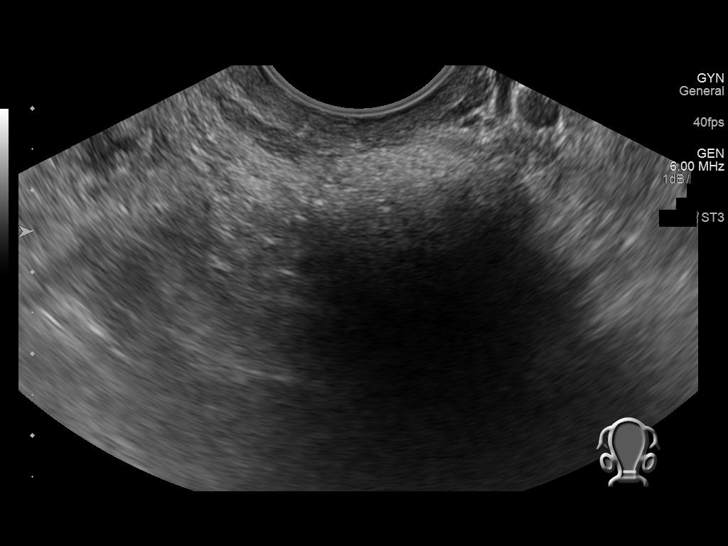
[im 56/67]
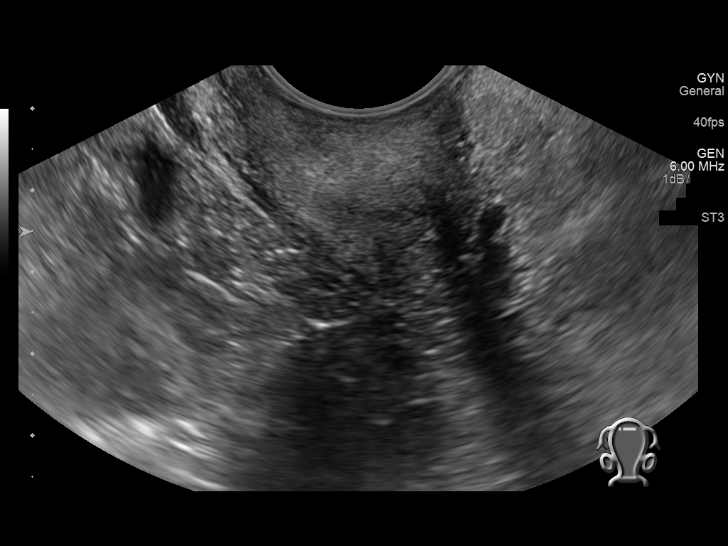
[im 61/67]
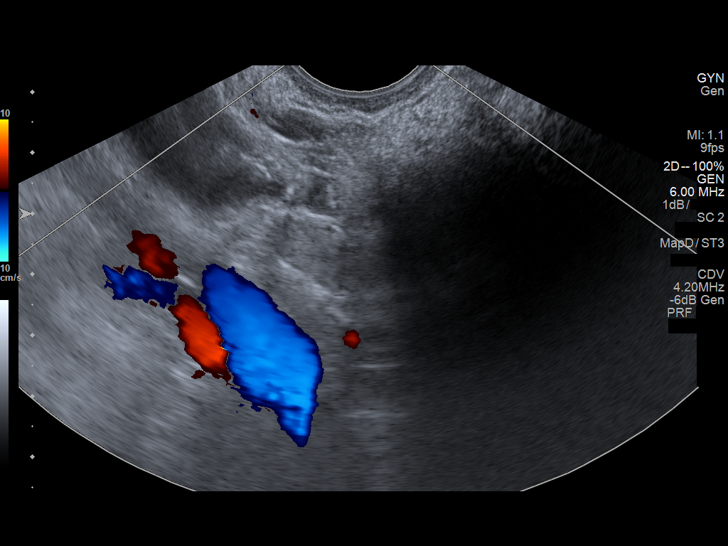
[im 67/67]
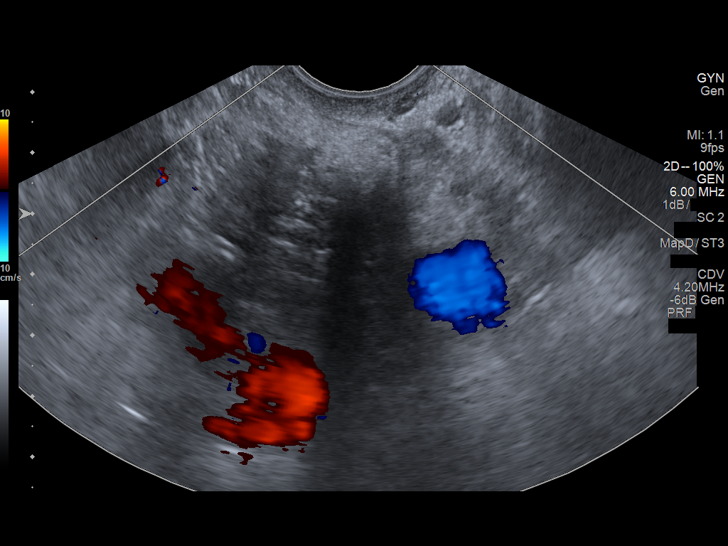

[14 of 25 positions shown; findings below may reference images not displayed]

FINDINGS: Uterus

Measurements: 7.8 x 2.1 x 5.1 cm. No fibroids or other mass
visualized.

Endometrium

Thickness: 3 mm. No focal abnormality visualized. Suboptimally
visualized on transvaginal images due to position of uterus and
patient discomfort.

Right ovary

Surgically removed.  No adnexal mass or free fluid seen.

Left ovary

Surgically removed.  No adnexal mass or free fluid seen.

Other findings

No abnormal free fluid.
IMPRESSION: 1. Uterus appears grossly normal, with study limitations detailed
above.
2. Ovaries have been removed. No adnexal mass or free fluid seen
within either adnexal region.

## 2019-05-28 ENCOUNTER — Emergency Department: Payer: Medicare HMO

## 2019-05-28 ENCOUNTER — Emergency Department
Admission: EM | Admit: 2019-05-28 | Discharge: 2019-05-28 | Disposition: A | Discharge status: Home / Self Care | Payer: Medicare HMO | Attending: Emergency Medicine | Admitting: Emergency Medicine

## 2019-05-28 ENCOUNTER — Other Ambulatory Visit: Payer: Self-pay

## 2019-05-28 DIAGNOSIS — F17210 Nicotine dependence, cigarettes, uncomplicated: Secondary | ICD-10-CM | POA: Insufficient documentation

## 2019-05-28 DIAGNOSIS — W19XXXA Unspecified fall, initial encounter: Secondary | ICD-10-CM | POA: Diagnosis not present

## 2019-05-28 DIAGNOSIS — Z79899 Other long term (current) drug therapy: Secondary | ICD-10-CM | POA: Insufficient documentation

## 2019-05-28 DIAGNOSIS — Y999 Unspecified external cause status: Secondary | ICD-10-CM | POA: Insufficient documentation

## 2019-05-28 DIAGNOSIS — M25531 Pain in right wrist: Secondary | ICD-10-CM | POA: Diagnosis not present

## 2019-05-28 DIAGNOSIS — Y929 Unspecified place or not applicable: Secondary | ICD-10-CM | POA: Diagnosis not present

## 2019-05-28 DIAGNOSIS — Y939 Activity, unspecified: Secondary | ICD-10-CM | POA: Insufficient documentation

## 2019-05-28 DIAGNOSIS — S63501A Unspecified sprain of right wrist, initial encounter: Secondary | ICD-10-CM | POA: Insufficient documentation

## 2019-05-28 DIAGNOSIS — S6991XA Unspecified injury of right wrist, hand and finger(s), initial encounter: Secondary | ICD-10-CM | POA: Diagnosis present

## 2019-05-28 NOTE — ED Provider Notes (Signed)
[facility] Emergency Department Provider Note  ____________________________________________   First MD Initiated Contact with Patient 05/28/19 1133     (approximate)  I have reviewed the triage vital signs and the nursing notes.   HISTORY  Chief Complaint Wrist Pain    HPI Mathilde Forrey is a 53 y.o. female presents emergency department after she slipped and fell last night.  She landed on her right wrist.  No other injuries are reported.  States it hurts to move her fingers.    Past Medical History:  Diagnosis Date  . Acid reflux   . Seizures [facility])     Patient Active Problem List   Diagnosis Date Noted  . Chronic pelvic pain in female 08/24/2018  . Status post bilateral salpingo-oophorectomy (BSO) 08/24/2018  . History of 3 cesarean sections 08/24/2018  . Vasomotor symptoms due to menopause 08/24/2018  . BMI 33.0-33.9,adult 08/24/2018  . Urinary urgency 08/24/2018  . Urinary frequency 08/24/2018    Past Surgical History:  Procedure Laterality Date  . APPENDECTOMY    . CESAREAN SECTION    . CHOLECYSTECTOMY    . COLON RESECTION    . OVARY SURGERY      Prior to Admission medications   Medication Sig Start Date End Date Taking? Authorizing Provider  diazepam (VALIUM) 5 MG tablet Take 1 tablet (5 mg total) by mouth every 6 (six) hours as needed (vaginal valium). 12/13/18   Philip Aspen, CNM  ketorolac (TORADOL) 10 MG tablet Take 10 mg by mouth every 6 (six) hours as needed.    [provider]  levETIRAcetam (KEPPRA) 500 MG tablet Take by mouth. 12/16/17   [provider]  meloxicam (MOBIC) 15 MG tablet  10/04/18   [provider]  meloxicam (MOBIC) 15 MG tablet Take 1 tablet (15 mg total) by mouth daily. 11/16/18   Triplett, Cari B, FNP  oxyCODONE (ROXICODONE) 5 MG immediate release tablet Take 1 tablet (5 mg total) by mouth every 8 (eight) hours as needed. 11/16/18 11/16/19  Triplett, Johnette Abraham B, FNP  phenytoin  (DILANTIN) 125 MG/5ML suspension Take 125 mg by mouth 3 (three) times daily.    [provider]  traMADol (ULTRAM) 50 MG tablet Take 1 tablet (50 mg total) by mouth every 6 (six) hours as needed. 02/19/19 02/19/20  Harvest Dark, MD    Allergies Penicillins  Family History  Problem Relation Age of Onset  . Breast cancer Mother   . Colon cancer Father   . Diabetes Father   . Ovarian cancer Neg Hx     Social History Social History   Tobacco Use  . Smoking status: Current Every Day Smoker    Packs/day: 0.25    Types: Cigarettes  . Smokeless tobacco: Never Used  Substance Use Topics  . Alcohol use: Not Currently    Frequency: Never  . Drug use: Not Currently    Review of Systems  Constitutional: No fever/chills Eyes: No visual changes. ENT: No sore throat. Respiratory: Denies cough Genitourinary: Negative for dysuria. Musculoskeletal: Negative for back pain.  Positive for right wrist pain Skin: Negative for rash.    ____________________________________________   PHYSICAL EXAM:  VITAL SIGNS: ED Triage Vitals  Enc Vitals Group     BP 05/28/19 1037 111/79     Pulse Rate 05/28/19 1037 91     Resp 05/28/19 1037 18     Temp 05/28/19 1037 98.2 F (36.8 C)     Temp Source 05/28/19 1037 Oral  SpO2 05/28/19 1037 97 %     Weight 05/28/19 1038 209 lb (94.8 kg)     Height 05/28/19 1038 5\' 4"  (1.626 m)     Head Circumference --      Peak Flow --      Pain Score 05/28/19 1038 10     Pain Loc --      Pain Edu? --      Excl. in [place]? --     Constitutional: Alert and oriented. Well appearing and in no acute distress. Eyes: Conjunctivae are normal.  Head: Atraumatic. Nose: No congestion/rhinnorhea. Mouth/Throat: Mucous membranes are moist.   Neck:  supple no lymphadenopathy noted Cardiovascular: Normal rate, regular rhythm. Respiratory: Normal respiratory effort.  No retractions GU: deferred Musculoskeletal: Decreased range of motion of the right  wrist.  Neurovascular is intact, bony prominences of the wrist are tender.   Neurologic:  Normal speech and language.  Skin:  Skin is warm, dry and intact. No rash noted. Psychiatric: Mood and affect are normal. Speech and behavior are normal.  ____________________________________________   LABS (all labs ordered are listed, but only abnormal results are displayed)  Labs Reviewed - No data to display ____________________________________________   ____________________________________________  RADIOLOGY  X-ray of the right wrist is negative for fracture  ____________________________________________   PROCEDURES  Procedure(s) performed: Velcro cock-up splint was applied by me, patient is neurovascularly intact post splint application  Procedures    ____________________________________________   INITIAL IMPRESSION / ASSESSMENT AND PLAN / ED COURSE  Pertinent labs & imaging results that were available during my care of the patient were reviewed by me and considered in my medical decision making (see chart for details).   Patient is 53 year old female presents emergency department after a fall landing on the right wrist.  Physical exam shows the right wrist to be tender and swollen.  Neurovascular is intact.  X-ray of the right wrist is negative for fracture.  Explained all the findings to the patient.  She is placed in a Velcro cock-up splint.  Instructed to follow-up with orthopedics.  She states she understands will comply.  She was discharged in stable condition.     As part of my medical decision making, I reviewed the following data within the [place] notes reviewed and incorporated, Old chart reviewed, Radiograph reviewed x-ray of the right wrist is negative, Notes from prior ED visits and [place] Controlled Substance Database  ____________________________________________   FINAL CLINICAL IMPRESSION(S) / ED DIAGNOSES  Final diagnoses:   Sprain of right wrist, initial encounter      NEW MEDICATIONS STARTED DURING THIS VISIT:  New Prescriptions   No medications on file     Note:  This document was prepared using Dragon voice recognition software and may include unintentional dictation errors.    Versie Starks, PA-C 05/28/19 1225    Nena Polio, MD 05/28/19 [phone]

## 2019-05-28 NOTE — ED Notes (Signed)
Pt with c/o of right wrist pain. No deformity or swelling noted. Pt has full movement of all fingers.

## 2019-05-28 NOTE — Discharge Instructions (Addendum)
Follow-up with your regular doctor or Dr. Rudene Christians if not better in 1 week.  Wear the wrist splint as instructed.  You may remove it to get in the shower only.  Take Tylenol and ibuprofen for pain.  Apply ice.  Return if worsening.

## 2019-05-28 NOTE — ED Notes (Signed)
Pt verbalized understanding of discharge instructions. NAD at this time. 

## 2019-05-28 NOTE — ED Triage Notes (Signed)
Pt states she slipped and fell last night and injured her right wrist. No noted deformity

## 2019-06-02 ENCOUNTER — Encounter: Payer: Self-pay | Admitting: *Deleted

## 2019-06-02 ENCOUNTER — Emergency Department: Payer: Medicare HMO

## 2019-06-02 ENCOUNTER — Emergency Department
Admission: EM | Admit: 2019-06-02 | Discharge: 2019-06-02 | Disposition: A | Discharge status: Home / Self Care | Payer: Medicare HMO | Attending: Emergency Medicine | Admitting: Emergency Medicine

## 2019-06-02 ENCOUNTER — Other Ambulatory Visit: Payer: Self-pay

## 2019-06-02 DIAGNOSIS — R102 Pelvic and perineal pain: Secondary | ICD-10-CM | POA: Diagnosis not present

## 2019-06-02 DIAGNOSIS — Z9049 Acquired absence of other specified parts of digestive tract: Secondary | ICD-10-CM | POA: Insufficient documentation

## 2019-06-02 DIAGNOSIS — R1031 Right lower quadrant pain: Secondary | ICD-10-CM | POA: Diagnosis not present

## 2019-06-02 DIAGNOSIS — F17210 Nicotine dependence, cigarettes, uncomplicated: Secondary | ICD-10-CM | POA: Diagnosis not present

## 2019-06-02 DIAGNOSIS — R110 Nausea: Secondary | ICD-10-CM | POA: Diagnosis not present

## 2019-06-02 DIAGNOSIS — R109 Unspecified abdominal pain: Secondary | ICD-10-CM | POA: Diagnosis not present

## 2019-06-02 DIAGNOSIS — G8929 Other chronic pain: Secondary | ICD-10-CM | POA: Diagnosis not present

## 2019-06-02 DIAGNOSIS — Z79899 Other long term (current) drug therapy: Secondary | ICD-10-CM | POA: Insufficient documentation

## 2019-06-02 LAB — CBC
HCT: 38.4 % (ref 36.0–46.0)
Hemoglobin: 13.1 g/dL (ref 12.0–15.0)
MCH: 30.7 pg (ref 26.0–34.0)
MCHC: 34.1 g/dL (ref 30.0–36.0)
MCV: 89.9 fL (ref 80.0–100.0)
Platelets: 274 10*3/uL (ref 150–400)
RBC: 4.27 MIL/uL (ref 3.87–5.11)
RDW: 12.6 % (ref 11.5–15.5)
WBC: 8.4 10*3/uL (ref 4.0–10.5)
nRBC: 0 % (ref 0.0–0.2)

## 2019-06-02 LAB — COMPREHENSIVE METABOLIC PANEL
ALT: 19 U/L (ref 0–44)
AST: 19 U/L (ref 15–41)
Albumin: 4.1 g/dL (ref 3.5–5.0)
Alkaline Phosphatase: 69 U/L (ref 38–126)
Anion gap: 7 (ref 5–15)
BUN: 18 mg/dL (ref 6–20)
CO2: 24 mmol/L (ref 22–32)
Calcium: 8.7 mg/dL — ABNORMAL LOW (ref 8.9–10.3)
Chloride: 110 mmol/L (ref 98–111)
Creatinine, Ser: 0.92 mg/dL (ref 0.44–1.00)
GFR calc Af Amer: >60 mL/min (ref >60)
GFR calc non Af Amer: >60 mL/min (ref >60)
Glucose, Bld: 97 mg/dL (ref 70–99)
Potassium: 4.2 mmol/L (ref 3.5–5.1)
Sodium: 141 mmol/L (ref 135–145)
Total Bilirubin: 0.5 mg/dL (ref 0.3–1.2)
Total Protein: 6.8 g/dL (ref 6.5–8.1)

## 2019-06-02 LAB — LIPASE, BLOOD: Lipase: 26 U/L (ref 11–51)

## 2019-06-02 IMAGING — CT CT ABDOMEN AND PELVIS WITH CONTRAST
2 of 5 series · 17 of 46 positions shown, 19 images · IV contrast (APPLIED)
Comparison: None.

CLINICAL DATA: Worsening abdominal pain, diverticulitis suspected,
planned hysterectomy.

EXAM:
CT ABDOMEN AND PELVIS WITH CONTRAST
TECHNIQUE: Multidetector CT imaging of the abdomen and pelvis was performed
using the standard protocol following bolus administration of
intravenous contrast.
CONTRAST:  100mL OMNIPAQUE IOHEXOL 300 MG/ML SOLN, additional oral
enteric contrast

[Series 2: routine abd/pel with · axial · 0.98mm/px · z∈[-631,-226]mm · 14 of 93 slices shown, 16 images]
[im 6/93  soft-tissue]
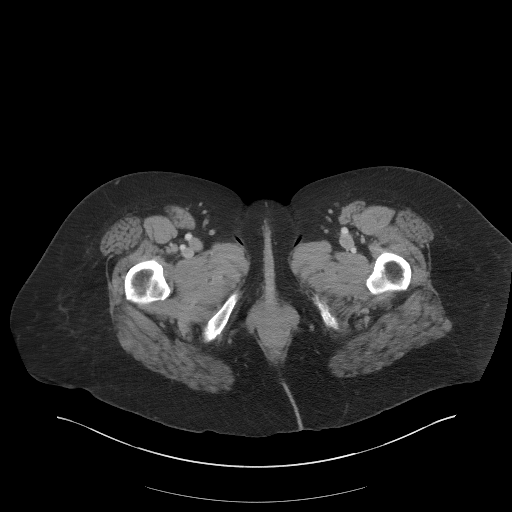
[im 6/93  bone]
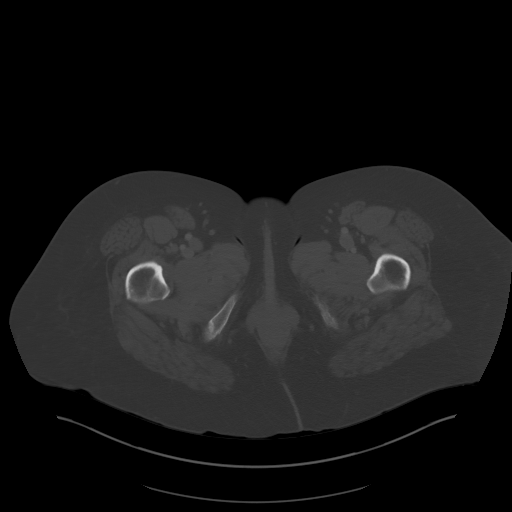
[im 11/93  soft-tissue]
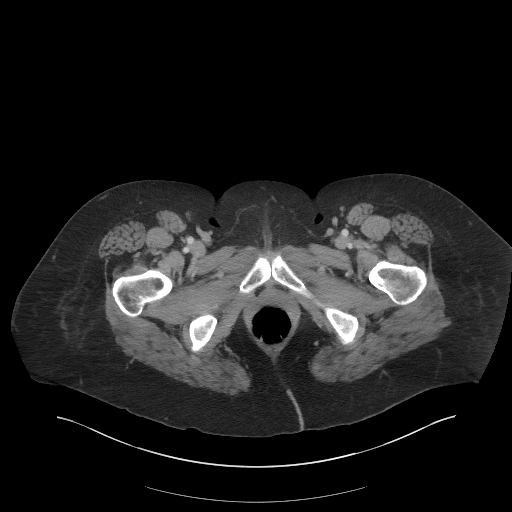
[im 21/93  soft-tissue]
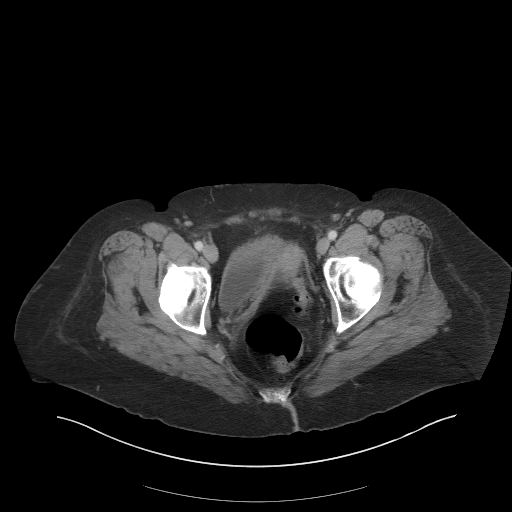
[im 26/93  soft-tissue]
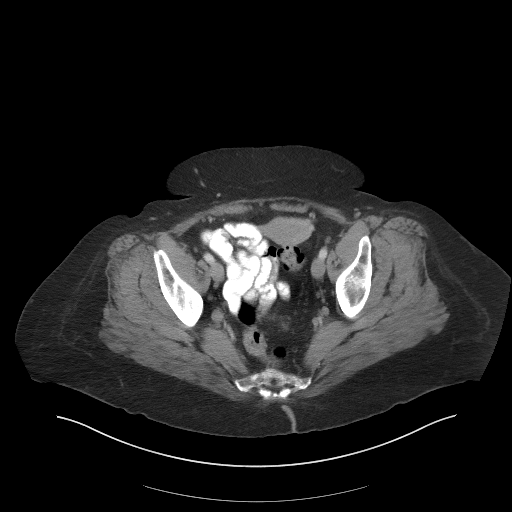
[im 31/93  soft-tissue]
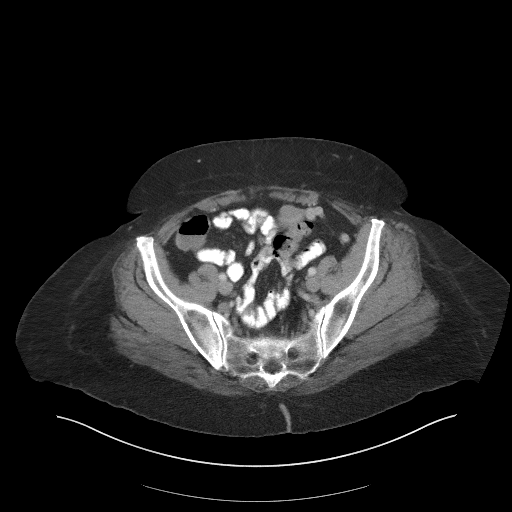
[im 36/93  soft-tissue]
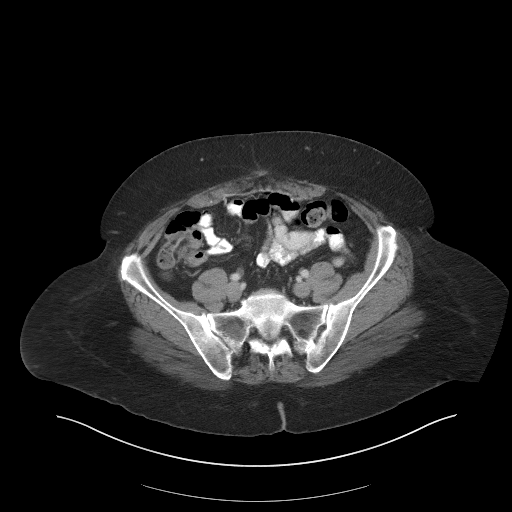
[im 41/93  soft-tissue]
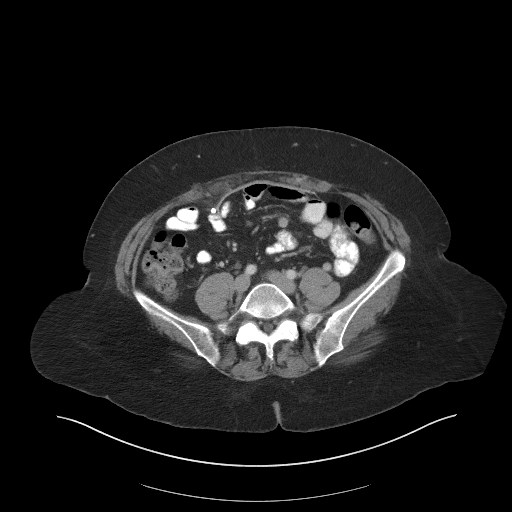
[im 52/93  soft-tissue]
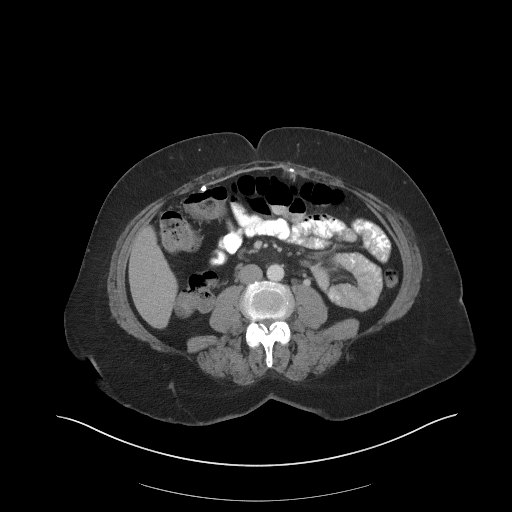
[im 57/93  soft-tissue]
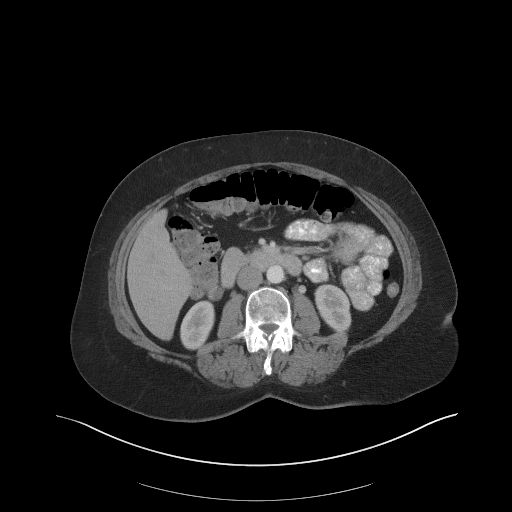
[im 57/93  bone]
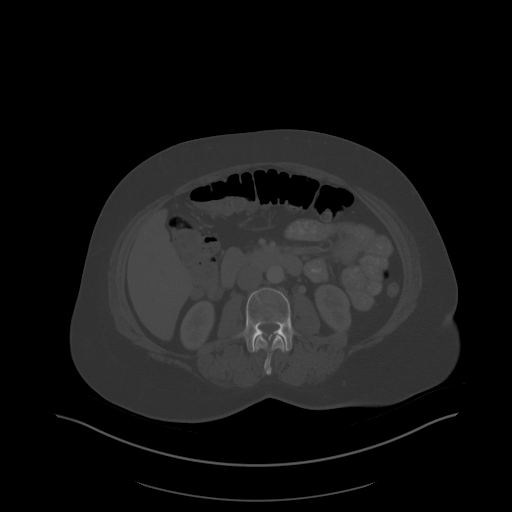
[im 62/93  soft-tissue]
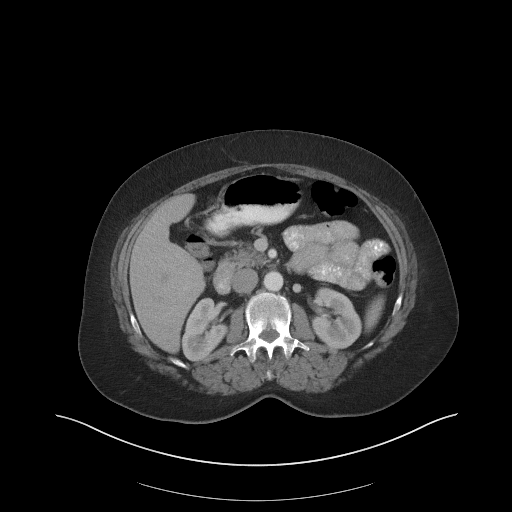
[im 67/93  soft-tissue]
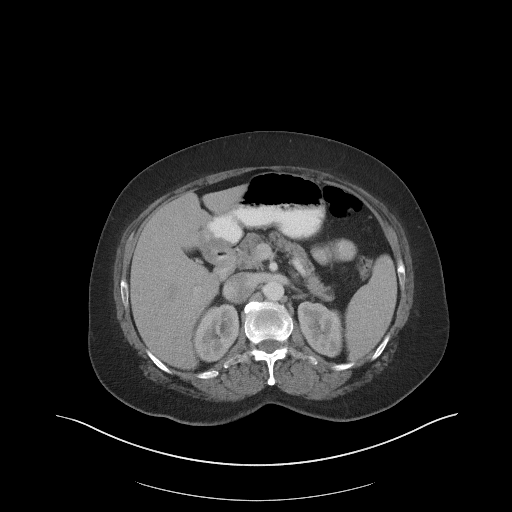
[im 72/93  soft-tissue]
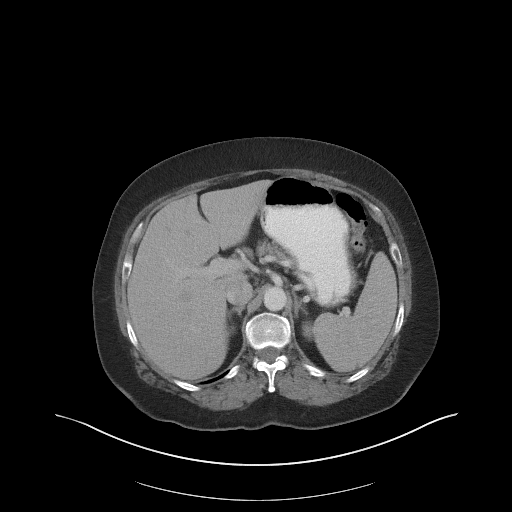
[im 82/93  soft-tissue]
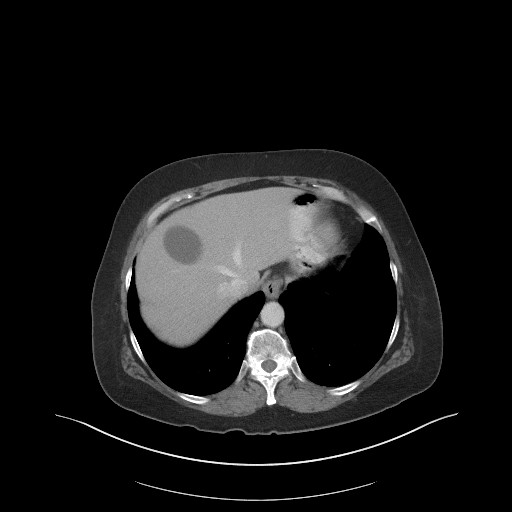
[im 87/93  soft-tissue]
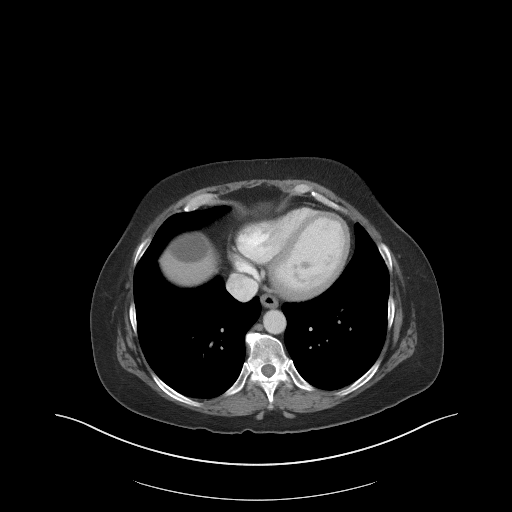

[Series 5: coronal st · coronal · 0.91mm/px · 3 of 98 slices shown]
[im 33/98  soft-tissue]
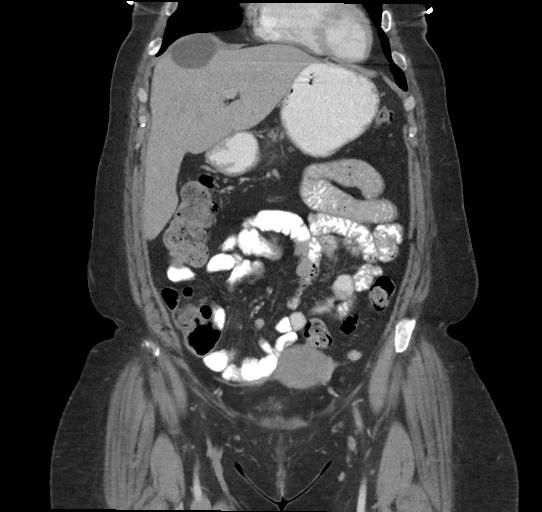
[im 44/98  soft-tissue]
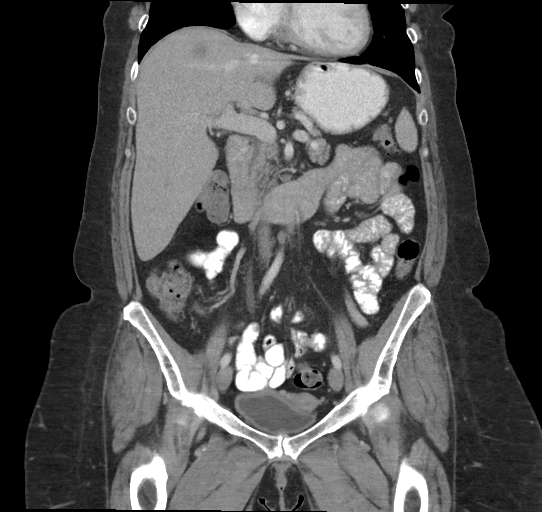
[im 54/98  soft-tissue]
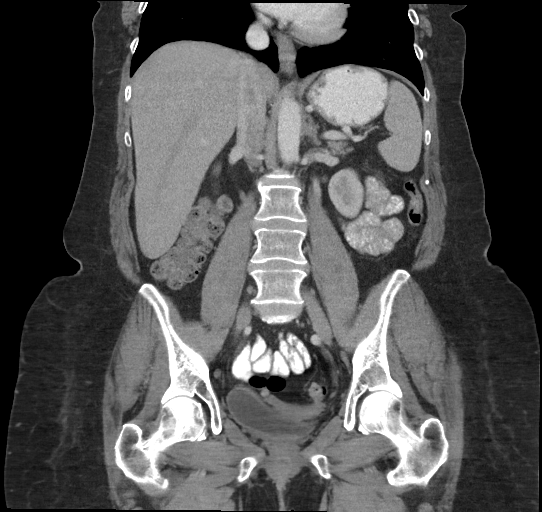

[17 of 46 positions shown; findings below may reference images not displayed]

FINDINGS: Lower chest: No acute abnormality.

Hepatobiliary: No solid liver abnormality is seen. There is a 4.6 cm
cyst of the liver dome. No gallstones, gallbladder wall thickening,
or biliary dilatation.

Pancreas: Unremarkable. No pancreatic ductal dilatation or
surrounding inflammatory changes.

Spleen: Normal in size without significant abnormality.

Adrenals/Urinary Tract: Adrenal glands are unremarkable. Kidneys are
normal, without renal calculi, solid lesion, or hydronephrosis.
Bladder is unremarkable.

Stomach/Bowel: Stomach is within normal limits. No evidence of bowel
wall thickening, distention, or inflammatory changes.

Vascular/Lymphatic: No significant vascular findings are present. No
enlarged abdominal or pelvic lymph nodes.

Reproductive: No mass or other significant abnormality. No CT
abnormality of the uterus.

Other: There is mesh repair of the midline ventral abdomen. No
abdominopelvic ascites.

Musculoskeletal: No acute or significant osseous findings.
IMPRESSION: 1.  No acute CT findings of the abdomen or pelvis to explain pain.

2.  No significant diverticular disease.

3.  Mesh repair of the midline ventral abdomen.

4.  No CT abnormality of the uterus.

## 2019-06-02 MED ORDER — TRAMADOL HCL 50 MG PO TABS
50.0000 mg | ORAL_TABLET | Freq: Once | ORAL | Status: AC
  Administered 2019-06-02: 50 mg via ORAL
  Filled 2019-06-02: qty 1

## 2019-06-02 MED ORDER — TRAMADOL HCL 50 MG PO TABS: 50.0000 mg | ORAL_TABLET | Freq: Four times a day (QID) | ORAL | 0 refills | Status: AC | PRN

## 2019-06-02 MED ORDER — IOHEXOL 300 MG/ML  SOLN
100.0000 mL | Freq: Once | INTRAMUSCULAR | Status: AC | PRN
  Administered 2019-06-02: 100 mL via INTRAVENOUS

## 2019-06-02 MED ORDER — IOHEXOL 240 MG/ML SOLN
50.0000 mL | Freq: Once | INTRAMUSCULAR | Status: AC | PRN
  Administered 2019-06-02: 50 mL via ORAL

## 2019-06-02 NOTE — ED Provider Notes (Signed)
[facility] Emergency Department Provider Note ____________________________________________   First MD Initiated Contact with Patient 06/02/19 1648     (approximate)  I have reviewed the triage vital signs and the nursing notes.   HISTORY  Chief Complaint Abdominal Pain    HPI Kristen Hudson is a 53 y.o. female with PMH as noted below who presents with right lower quadrant abdominal pain over approximately last 3 days, gradual onset, and associated with nausea but no vomiting.  The patient has a history of chronic suprapubic and left lower abdominal/pelvic pain.  She states that she has been seeing a specialist at [facility] and was supposed to have a hysterectomy but he did not get scheduled due to COVID-19.  However, she states that the pain over the last 3 days is in a different location and worse in intensity.  She denies any change in her bowel movements, and has no vaginal bleeding or discharge.  She has no dysuria or fever.   Past Medical History:  Diagnosis Date  . Acid reflux   . Seizures [facility])     Patient Active Problem List   Diagnosis Date Noted  . Chronic pelvic pain in female 08/24/2018  . Status post bilateral salpingo-oophorectomy (BSO) 08/24/2018  . History of 3 cesarean sections 08/24/2018  . Vasomotor symptoms due to menopause 08/24/2018  . BMI 33.0-33.9,adult 08/24/2018  . Urinary urgency 08/24/2018  . Urinary frequency 08/24/2018    Past Surgical History:  Procedure Laterality Date  . APPENDECTOMY    . CESAREAN SECTION    . CHOLECYSTECTOMY    . COLON RESECTION    . OVARY SURGERY      Prior to Admission medications   Medication Sig Start Date End Date Taking? Authorizing Provider  cyclobenzaprine (FLEXERIL) 5 MG tablet TAKE 1 TO 2 TABLETS BY MOUTH THREE TIMES DAILY AS NEEDED FOR MUSCLE SPASM 05/04/19  Yes [provider]  diazepam (VALIUM) 5 MG tablet Take 1 tablet (5 mg total) by mouth every 6 (six) hours as needed  (vaginal valium). Patient taking differently: Take 5 mg by mouth every 6 (six) hours as needed.  12/13/18  Yes Philip Aspen, CNM  gabapentin (NEURONTIN) 300 MG capsule 300mg  qHS x 7 d, then 600mg  qHS x 7 d, then 300mg  qAM and 600mg  qHS x 7 d, then 300mg  qAM, 300mg  q-afternoon and 600mg  qHS 05/04/19  Yes [provider]  levETIRAcetam (KEPPRA) 500 MG tablet Take 500 mg by mouth 2 (two) times daily.  12/16/17  Yes [provider]  meloxicam (MOBIC) 15 MG tablet Take 1 tablet (15 mg total) by mouth daily. 11/16/18  Yes Triplett, Cari B, FNP  oxyCODONE (ROXICODONE) 5 MG immediate release tablet Take 1 tablet (5 mg total) by mouth every 8 (eight) hours as needed. 11/16/18 11/16/19 Yes Triplett, Cari B, FNP  phenytoin (DILANTIN) 125 MG/5ML suspension Take 125 mg by mouth 2 (two) times daily.    Yes [provider]  traMADol (ULTRAM) 50 MG tablet Take 1 tablet (50 mg total) by mouth every 6 (six) hours as needed for up to 3 days for severe pain. 06/02/19 06/05/19  Arta Silence, MD    Allergies Penicillins  Family History  Problem Relation Age of Onset  . Breast cancer Mother   . Colon cancer Father   . Diabetes Father   . Ovarian cancer Neg Hx     Social History Social History   Tobacco Use  . Smoking status: Current Every Day Smoker  Packs/day: 0.25    Types: Cigarettes  . Smokeless tobacco: Never Used  Substance Use Topics  . Alcohol use: Not Currently    Frequency: Never  . Drug use: Not Currently    Review of Systems  Constitutional: No fever. Eyes: No redness. ENT: No sore throat. Cardiovascular: Denies chest pain. Respiratory: Denies shortness of breath. Gastrointestinal: Positive for nausea.  No vomiting or diarrhea. Genitourinary: Negative for dysuria.  Musculoskeletal: Negative for back pain. Skin: Negative for rash. Neurological: Negative for headache.   ____________________________________________   PHYSICAL EXAM:  VITAL  SIGNS: ED Triage Vitals  Enc Vitals Group     BP 06/02/19 1224 122/87     Pulse Rate 06/02/19 1224 86     Resp --      Temp 06/02/19 1224 98.2 F (36.8 C)     Temp Source 06/02/19 1224 Oral     SpO2 06/02/19 1224 96 %     Weight 06/02/19 1224 238 lb (108 kg)     Height 06/02/19 1224 5\' 4"  (1.626 m)     Head Circumference --      Peak Flow --      Pain Score 06/02/19 1232 10     Pain Loc --      Pain Edu? --      Excl. in [place]? --     Constitutional: Alert and oriented.  Relatively well appearing and in no acute distress. Eyes: Conjunctivae are normal.  No scleral icterus. Head: Atraumatic. Nose: No congestion/rhinnorhea. Mouth/Throat: Mucous membranes are moist.   Neck: Normal range of motion.  Cardiovascular: Good peripheral circulation. Respiratory: Normal respiratory effort.  No retractions.  Gastrointestinal: Soft with mild right lower quadrant tenderness.  No distention.  Genitourinary: No flank tenderness. Musculoskeletal: Extremities warm and well perfused.  Neurologic:  Normal speech and language. No gross focal neurologic deficits are appreciated.  Skin:  Skin is warm and dry. No rash noted. Psychiatric: Mood and affect are normal. Speech and behavior are normal.  ____________________________________________   LABS (all labs ordered are listed, but only abnormal results are displayed)  Labs Reviewed  COMPREHENSIVE METABOLIC PANEL - Abnormal; Notable for the following components:      Result Value   Calcium 8.7 (*)    All other components within normal limits  LIPASE, BLOOD  CBC   ____________________________________________  EKG   ____________________________________________  RADIOLOGY  CT abdomen: No acute abnormality  ____________________________________________   PROCEDURES  Procedure(s) performed: No  Procedures  Critical Care performed: No ____________________________________________   INITIAL IMPRESSION / ASSESSMENT AND PLAN / ED  COURSE  Pertinent labs & imaging results that were available during my care of the patient were reviewed by me and considered in my medical decision making (see chart for details).  53 year old female with PMH as noted above presents with right lower quadrant abdominal pain over the last several days.  She has had chronic suprapubic and left-sided lower abdominal pain for the last several months but states that the acute pain is different.  She reports nausea but no vomiting, and has no diarrhea, urinary symptoms, or fever.  She has no vaginal bleeding or discharge.    I reviewed the past medical records in [place].  Based on prior notes, the patient has a history of a uterine prolapse.  She reports that she was supposed to have a hysterectomy scheduled but has not had yet due to COVID-19.  She was seen in the ED for unrelated symptoms due to a slip and  fall last month, and was seen in February for lower abdominal pain.  She did not have evidence of a prolapse at that time and her work-up was negative.  The patient states that she is status post oophorectomy, appendectomy, cholecystectomy, and multiple C-sections.  Differential includes fibroids, colitis, diverticulitis, or less likely hepatobiliary etiology or stump appendicitis.  We will obtain lab work-up, CT, and reassess.  The patient had a urinalysis at urgent care prior to coming to the hospital and it was negative.  I offered to perform pelvic exam but the patient declined.  Given the lack of vaginal bleeding or discharge and the lack of symptoms of prolapse, I think that this is reasonable.  ----------------------------------------- 7:31 PM on 06/02/2019 -----------------------------------------  CT shows no acute abnormalities.  The lab work-up is unremarkable.  On reassessment, the patient is feeling significantly better.  She feels comfortable and would like to go home.  Although the etiology of her pain is not certain, given her well  appearance, reassuring work-up, and relatively benign exam, she is stable for discharge.  At this time, there is no evidence of gynecologic etiology or indication for further imaging with ultrasound based on the reassuring CT results.  Return precautions given, the patient expressed understanding. ____________________________________________   FINAL CLINICAL IMPRESSION(S) / ED DIAGNOSES  Final diagnoses:  Right lower quadrant abdominal pain      NEW MEDICATIONS STARTED DURING THIS VISIT:  New Prescriptions   TRAMADOL (ULTRAM) 50 MG TABLET    Take 1 tablet (50 mg total) by mouth every 6 (six) hours as needed for up to 3 days for severe pain.     Note:  This document was prepared using Dragon voice recognition software and may include unintentional dictation errors.    Arta Silence, MD 06/02/19 [phone]

## 2019-06-02 NOTE — Discharge Instructions (Addendum)
Call on Monday to make an appointment to follow-up with your regular OB/GYN within the next few weeks.  Return to the ER for new, worsening, or persistent severe pain, vomiting, fever, urinary symptoms, vaginal bleeding, or any other new or worsening symptoms that concern you.  Take over-the-counter Tylenol or ibuprofen as needed for pain.  You may take the prescribed tramadol if needed for more severe pain.

## 2019-06-02 NOTE — ED Notes (Signed)
Patient transported to CT 

## 2019-06-02 NOTE — ED Notes (Signed)
Pt reports that she has been seeing a specialist in [place] and was "suppose to have my uterus removed a month or two ago but it never got scheduled" - pt c/o "uterine" pain and reports that she went to her PCP today and they sent her to the ED for eval and treatment because the pain is "more than I can bare"

## 2019-06-02 NOTE — ED Triage Notes (Signed)
Pt sent to ED from PCP for further evaluation for abd pain that has worsened yesterday. Pt reporting abd pain has been intermittent for the past few years. Pt has been told in the past there is "something" wrong with her Uterus and pt is supposed to have it removed but was delayed due to COVID. Pt is unsure what the dx was. No vaginal bleeding or discharge. Pt arrived to ED with urinalysis report which is normal. No NVD

## 2019-06-08 IMAGING — CR DG WRIST COMPLETE 3+V*L*
4 series · 4 of 4 positions shown · non-contrast
Comparison: None.

CLINICAL DATA: Initial evaluation for acute pain status post fall.

EXAM:
LEFT WRIST - COMPLETE 3+ VIEW

[wrist pa]
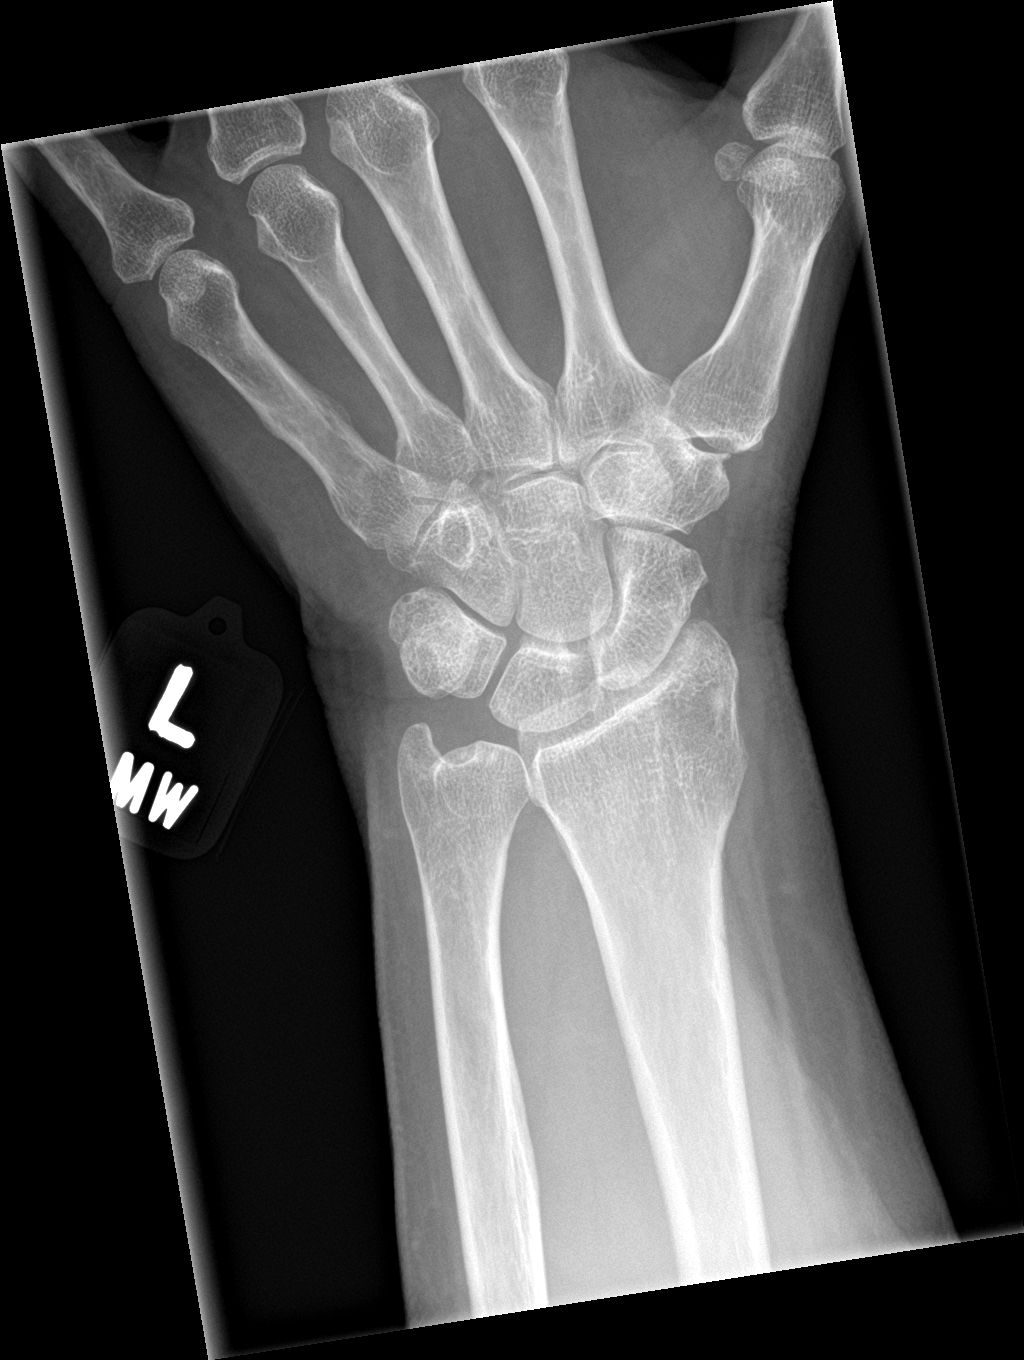

[wrist obl]
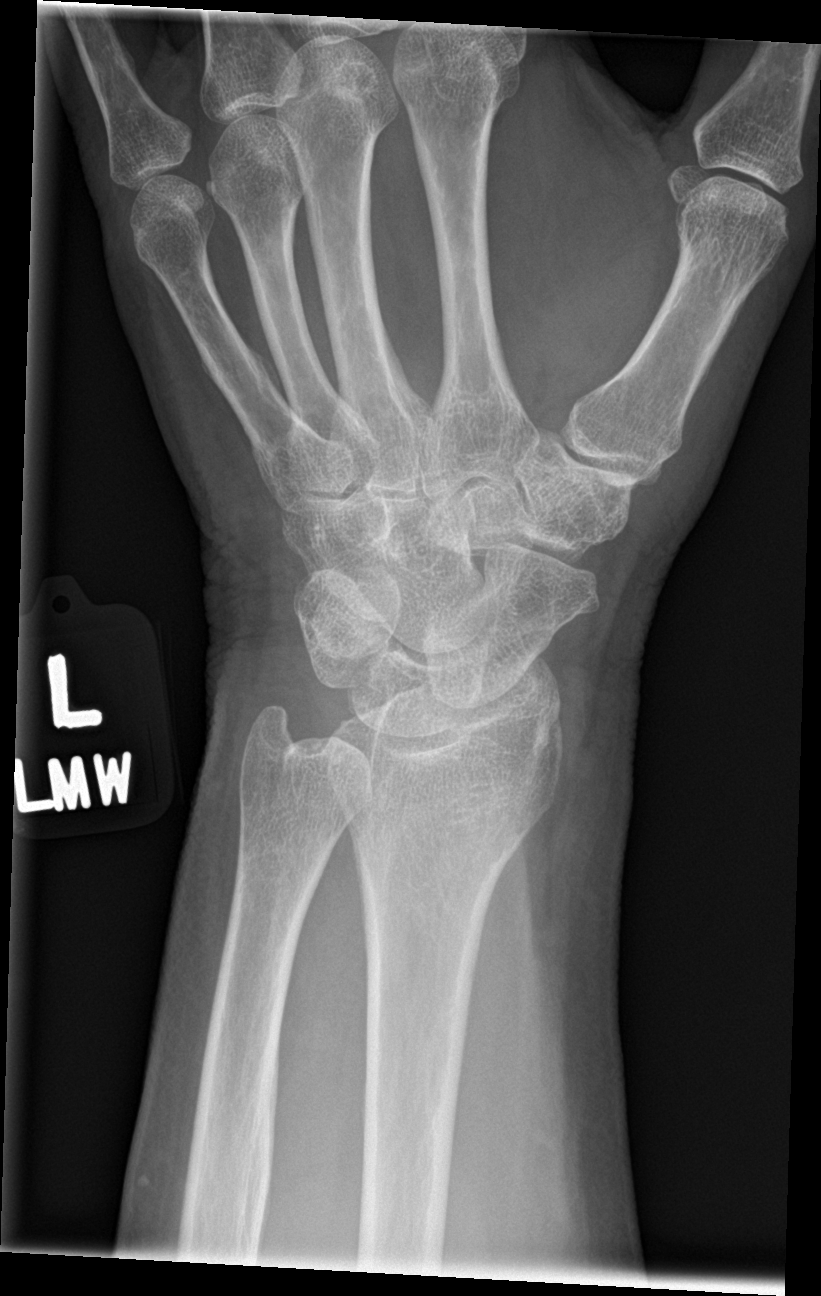

[wrist lat]
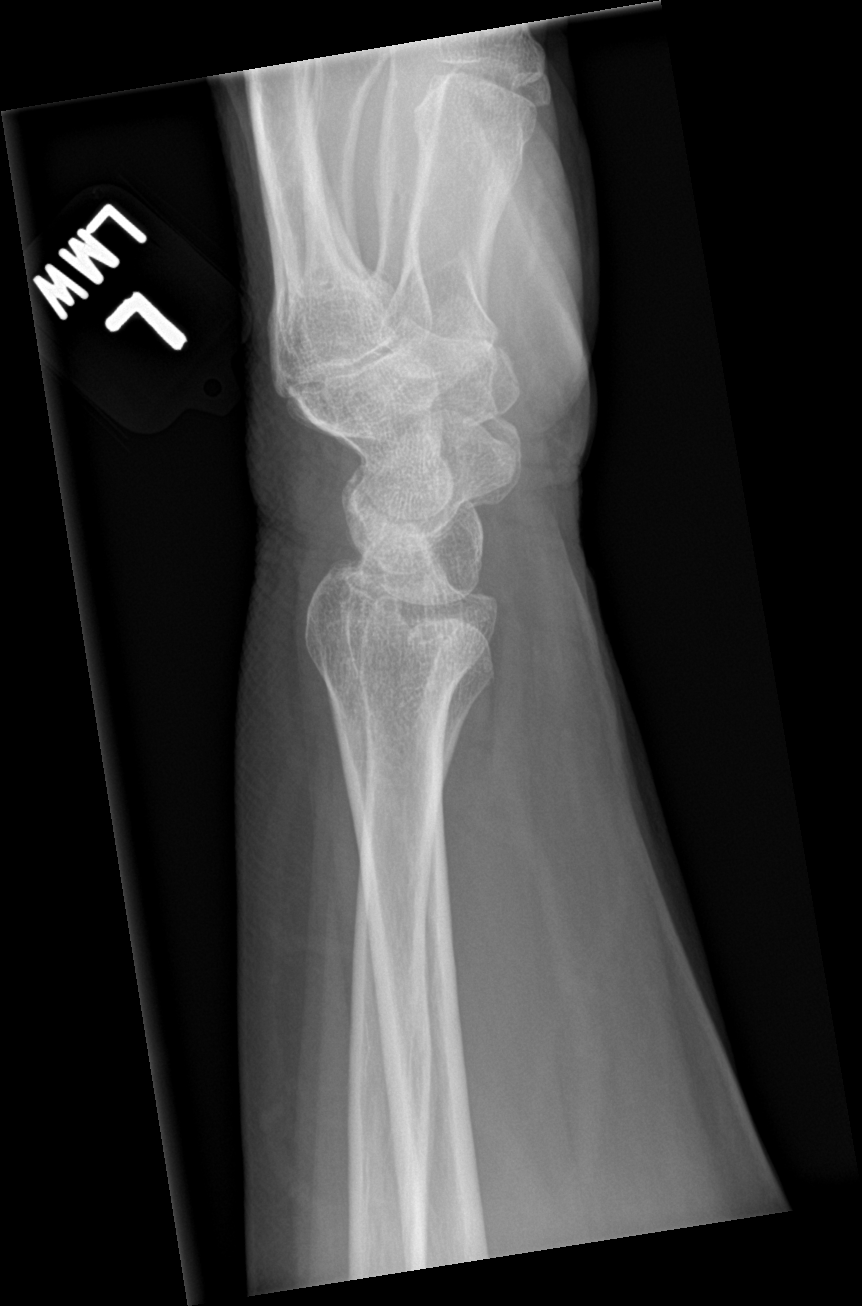

[navicular]
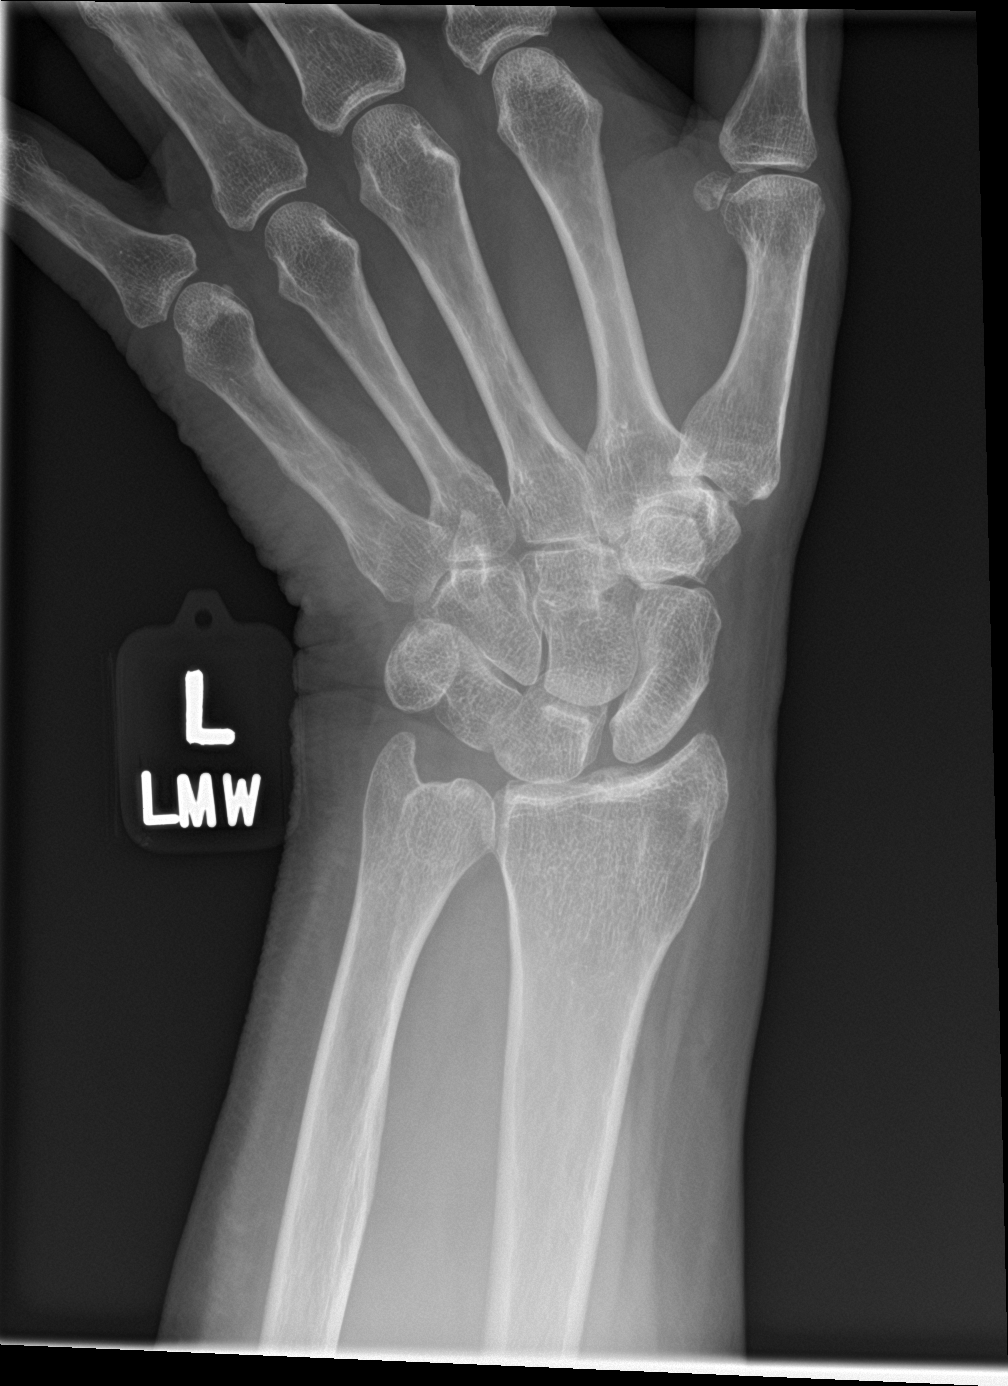

[4 of 4 positions shown; findings below may reference images not displayed]

FINDINGS: No acute fracture or dislocation. Normal radiocarpal and distal
radioulnar articulations maintained. Remotely healed fracture of the
left fifth metacarpal noted. No acute soft tissue abnormality.
IMPRESSION: 1. No acute osseous abnormality about the left wrist.
2. Remotely healed fracture of the left fifth metacarpal.

## 2019-07-21 DIAGNOSIS — R569 Unspecified convulsions: Secondary | ICD-10-CM | POA: Diagnosis not present

## 2019-07-21 DIAGNOSIS — M25569 Pain in unspecified knee: Secondary | ICD-10-CM | POA: Diagnosis not present

## 2019-07-21 DIAGNOSIS — N814 Uterovaginal prolapse, unspecified: Secondary | ICD-10-CM | POA: Diagnosis not present

## 2019-07-21 DIAGNOSIS — R911 Solitary pulmonary nodule: Secondary | ICD-10-CM | POA: Diagnosis not present

## 2019-07-21 DIAGNOSIS — R102 Pelvic and perineal pain: Secondary | ICD-10-CM | POA: Diagnosis not present

## 2019-08-03 ENCOUNTER — Encounter: Payer: Self-pay | Admitting: Emergency Medicine

## 2019-08-03 ENCOUNTER — Emergency Department
Admission: EM | Admit: 2019-08-03 | Discharge: 2019-08-03 | Discharge status: Against Medical Advice | Payer: Medicare HMO

## 2019-08-03 ENCOUNTER — Other Ambulatory Visit: Payer: Self-pay

## 2019-08-03 LAB — COMPREHENSIVE METABOLIC PANEL
ALT: 18 U/L (ref 0–44)
AST: 18 U/L (ref 15–41)
Albumin: 4.3 g/dL (ref 3.5–5.0)
Alkaline Phosphatase: 76 U/L (ref 38–126)
Anion gap: 8 (ref 5–15)
BUN: 22 mg/dL — ABNORMAL HIGH (ref 6–20)
CO2: 25 mmol/L (ref 22–32)
Calcium: 8.6 mg/dL — ABNORMAL LOW (ref 8.9–10.3)
Chloride: 109 mmol/L (ref 98–111)
Creatinine, Ser: 1.08 mg/dL — ABNORMAL HIGH (ref 0.44–1.00)
GFR calc Af Amer: >60 mL/min (ref >60)
GFR calc non Af Amer: 59 mL/min — ABNORMAL LOW (ref >60)
Glucose, Bld: 101 mg/dL — ABNORMAL HIGH (ref 70–99)
Potassium: 4.2 mmol/L (ref 3.5–5.1)
Sodium: 142 mmol/L (ref 135–145)
Total Bilirubin: 0.4 mg/dL (ref 0.3–1.2)
Total Protein: 6.7 g/dL (ref 6.5–8.1)

## 2019-08-03 LAB — URINALYSIS, COMPLETE (UACMP) WITH MICROSCOPIC
Bacteria, UA: NONE SEEN
Bilirubin Urine: NEGATIVE
Glucose, UA: NEGATIVE mg/dL
Hgb urine dipstick: NEGATIVE
Ketones, ur: 5 mg/dL — AB
Nitrite: NEGATIVE
Protein, ur: NEGATIVE mg/dL
Specific Gravity, Urine: 1.027 (ref 1.005–1.030)
WBC, UA: >50 WBC/hpf — ABNORMAL HIGH (ref 0–5)
pH: 5 (ref 5.0–8.0)

## 2019-08-03 LAB — CBC
HCT: 41 % (ref 36.0–46.0)
Hemoglobin: 14 g/dL (ref 12.0–15.0)
MCH: 31 pg (ref 26.0–34.0)
MCHC: 34.1 g/dL (ref 30.0–36.0)
MCV: 90.7 fL (ref 80.0–100.0)
Platelets: 290 10*3/uL (ref 150–400)
RBC: 4.52 MIL/uL (ref 3.87–5.11)
RDW: 12.1 % (ref 11.5–15.5)
WBC: 10.1 10*3/uL (ref 4.0–10.5)
nRBC: 0 % (ref 0.0–0.2)

## 2019-08-03 LAB — LIPASE, BLOOD: Lipase: 32 U/L (ref 11–51)

## 2019-08-03 MED ORDER — SODIUM CHLORIDE 0.9% FLUSH: 3.0000 mL | Freq: Once | INTRAVENOUS | Status: DC

## 2019-08-03 NOTE — ED Notes (Signed)
Patient called, no answer. Patient not seen in lobby or bathroom.

## 2019-08-03 NOTE — ED Notes (Signed)
Called, no answer.

## 2019-08-03 NOTE — ED Triage Notes (Signed)
Pt c/o upper abd pain that started this am with nausea. PT denies any vomiting or diarrhea.

## 2019-08-04 ENCOUNTER — Telehealth: Payer: Self-pay

## 2019-08-04 ENCOUNTER — Telehealth: Payer: Self-pay | Admitting: Certified Nurse Midwife

## 2019-08-04 NOTE — Telephone Encounter (Signed)
Spoke with patient. She states she has had some dysuria. She also states there is blood in her urine. Patient does not have menses anymore. Denies fever. Also states she is staying hydrated. She states she went to the ED last night but left because the wait was too long. A U/A was done and no blood was detected. Patient would not make it to the office today before closing time so it was suggested to pt she visit an Urgent Care. Also she may get some OTC Pyridium to help with symptoms and to stay hydrated. Patient was in agreement with plan.

## 2019-08-04 NOTE — Telephone Encounter (Signed)
The patient called and stated that she needs to speak with a nurse in regards to her urine being really dark and the patient is having a lot of concerns. Please advise.

## 2019-08-24 ENCOUNTER — Other Ambulatory Visit: Payer: Self-pay | Admitting: Internal Medicine

## 2019-08-24 DIAGNOSIS — Z1231 Encounter for screening mammogram for malignant neoplasm of breast: Secondary | ICD-10-CM

## 2019-09-22 ENCOUNTER — Emergency Department
Admission: EM | Admit: 2019-09-22 | Discharge: 2019-09-22 | Disposition: A | Discharge status: Home / Self Care | Payer: Medicare HMO | Attending: Emergency Medicine | Admitting: Emergency Medicine

## 2019-09-22 ENCOUNTER — Other Ambulatory Visit: Payer: Self-pay

## 2019-09-22 ENCOUNTER — Emergency Department: Payer: Medicare HMO

## 2019-09-22 DIAGNOSIS — N3000 Acute cystitis without hematuria: Secondary | ICD-10-CM | POA: Insufficient documentation

## 2019-09-22 DIAGNOSIS — F17210 Nicotine dependence, cigarettes, uncomplicated: Secondary | ICD-10-CM | POA: Insufficient documentation

## 2019-09-22 DIAGNOSIS — R102 Pelvic and perineal pain: Secondary | ICD-10-CM | POA: Diagnosis not present

## 2019-09-22 LAB — URINALYSIS, COMPLETE (UACMP) WITH MICROSCOPIC
Bilirubin Urine: NEGATIVE
Glucose, UA: NEGATIVE mg/dL
Ketones, ur: NEGATIVE mg/dL
Nitrite: NEGATIVE
Protein, ur: NEGATIVE mg/dL
Specific Gravity, Urine: 1.002 — ABNORMAL LOW (ref 1.005–1.030)
WBC, UA: >50 WBC/hpf — ABNORMAL HIGH (ref 0–5)
pH: 7 (ref 5.0–8.0)

## 2019-09-22 LAB — COMPREHENSIVE METABOLIC PANEL
ALT: 13 U/L (ref 0–44)
AST: 18 U/L (ref 15–41)
Albumin: 4.4 g/dL (ref 3.5–5.0)
Alkaline Phosphatase: 79 U/L (ref 38–126)
Anion gap: 11 (ref 5–15)
BUN: 14 mg/dL (ref 6–20)
CO2: 24 mmol/L (ref 22–32)
Calcium: 9.1 mg/dL (ref 8.9–10.3)
Chloride: 105 mmol/L (ref 98–111)
Creatinine, Ser: 1.04 mg/dL — ABNORMAL HIGH (ref 0.44–1.00)
GFR calc Af Amer: >60 mL/min (ref >60)
GFR calc non Af Amer: >60 mL/min (ref >60)
Glucose, Bld: 96 mg/dL (ref 70–99)
Potassium: 4.1 mmol/L (ref 3.5–5.1)
Sodium: 140 mmol/L (ref 135–145)
Total Bilirubin: 0.5 mg/dL (ref 0.3–1.2)
Total Protein: 7 g/dL (ref 6.5–8.1)

## 2019-09-22 LAB — CBC
HCT: 41.2 % (ref 36.0–46.0)
Hemoglobin: 14 g/dL (ref 12.0–15.0)
MCH: 31 pg (ref 26.0–34.0)
MCHC: 34 g/dL (ref 30.0–36.0)
MCV: 91.2 fL (ref 80.0–100.0)
Platelets: 288 10*3/uL (ref 150–400)
RBC: 4.52 MIL/uL (ref 3.87–5.11)
RDW: 12.2 % (ref 11.5–15.5)
WBC: 13.3 10*3/uL — ABNORMAL HIGH (ref 4.0–10.5)
nRBC: 0 % (ref 0.0–0.2)

## 2019-09-22 LAB — CK: Total CK: 54 U/L (ref 38–234)

## 2019-09-22 LAB — LIPASE, BLOOD: Lipase: 23 U/L (ref 11–51)

## 2019-09-22 IMAGING — US US PELVIS COMPLETE WITH TRANSVAGINAL
2 series · 13 of 25 positions shown · non-contrast
Comparison: [DATE]

CLINICAL DATA: Pelvic pain since this morning, postmenopausal



[Series 1: us pelvis complete with transvaginal · 7 of 72 slices shown (1 of 2)]
[im 1/72]
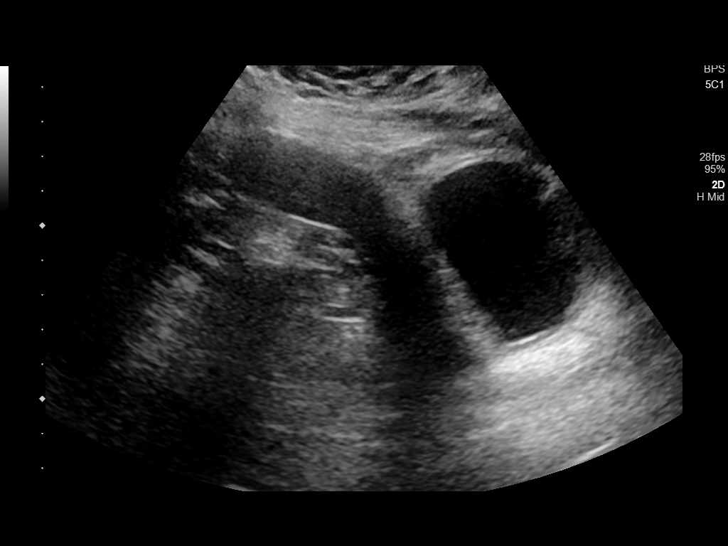
[im 12/72]
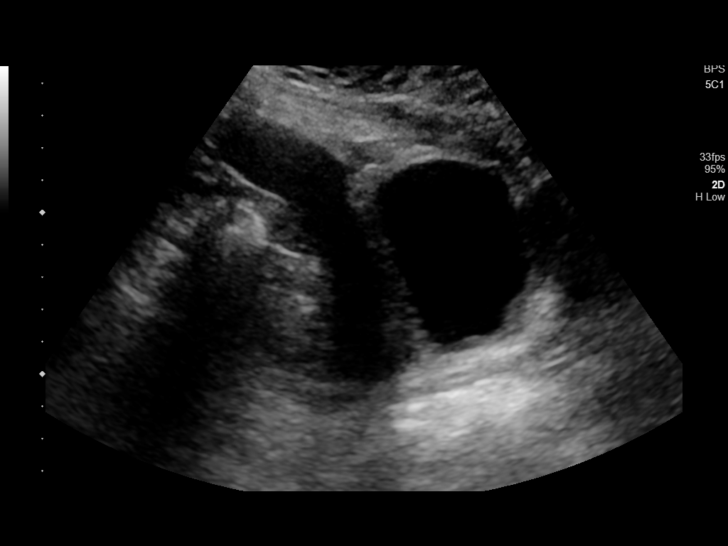
[im 24/72]
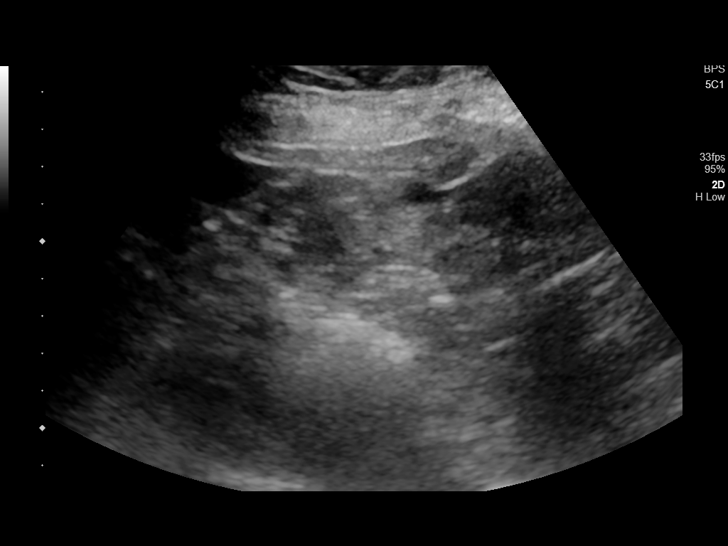
[im 36/72]
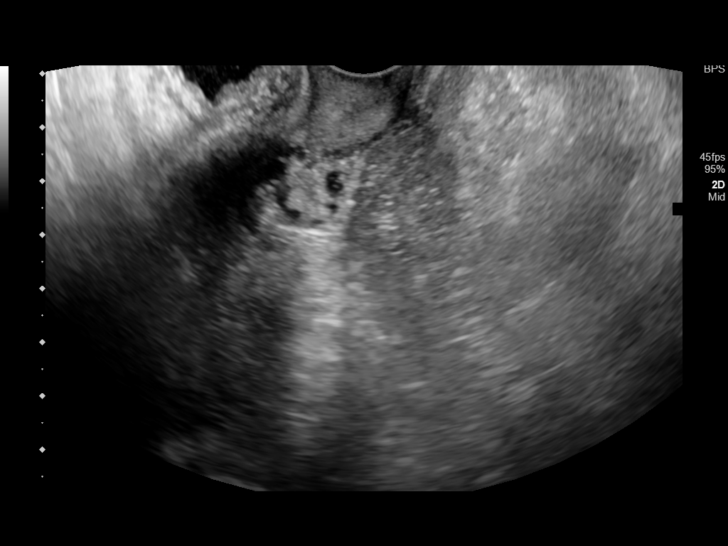
[im 48/72]
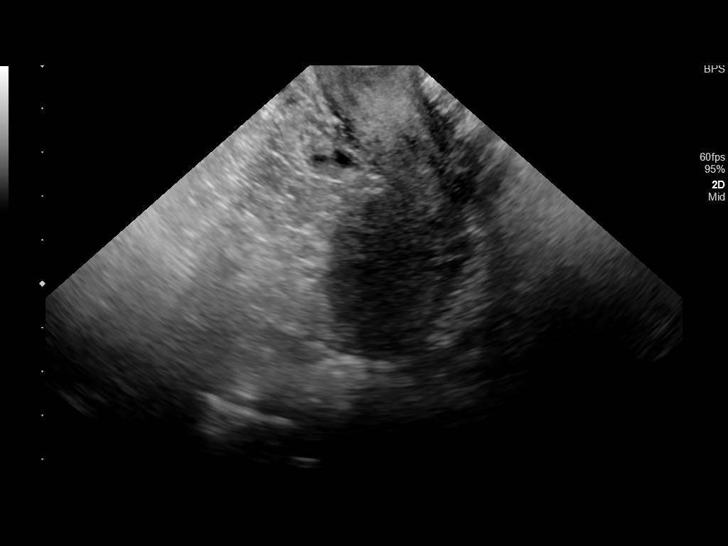
[im 60/72]
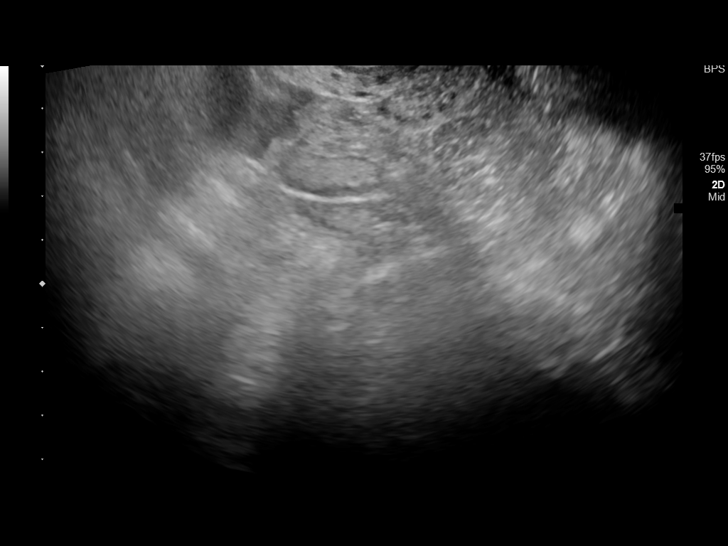
[im 72/72]
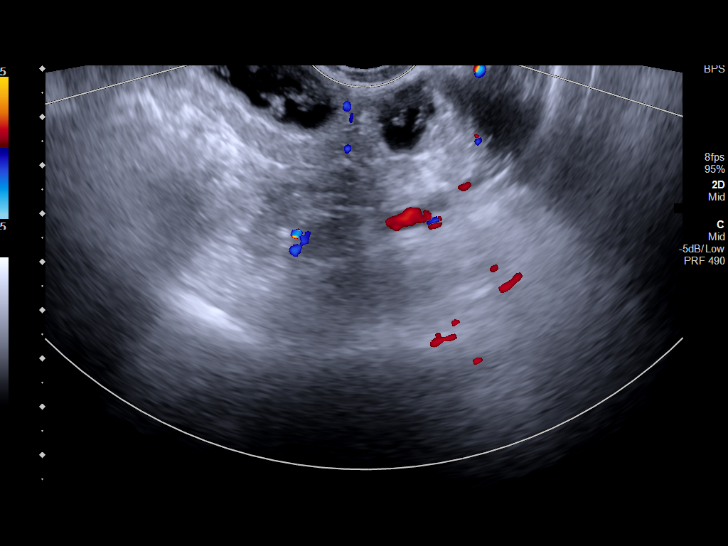

[Series 1: us pelvis complete with transvaginal · 6 of 71 slices shown (2 of 2)]
[im 7/71]
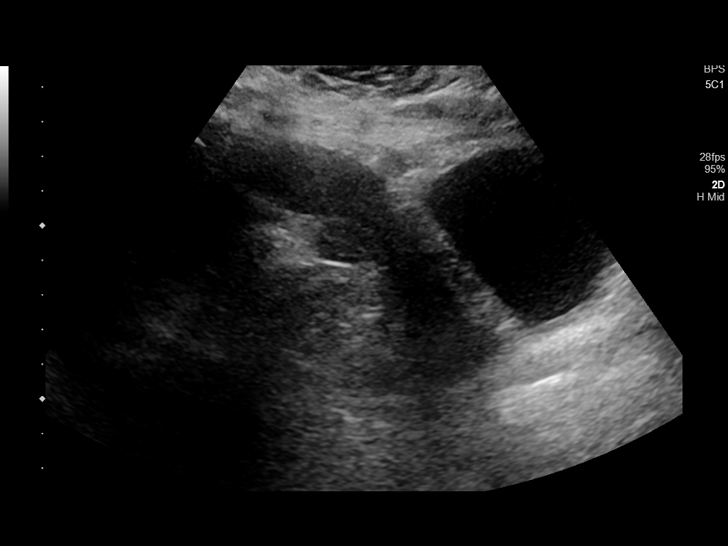
[im 20/71]
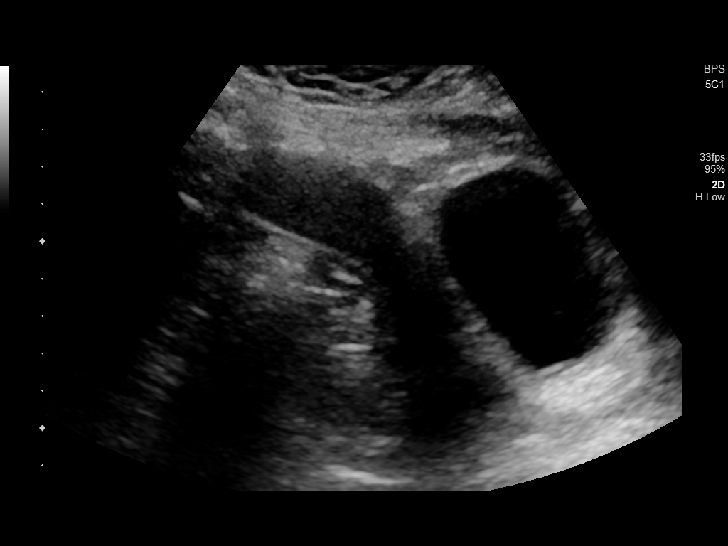
[im 32/71]
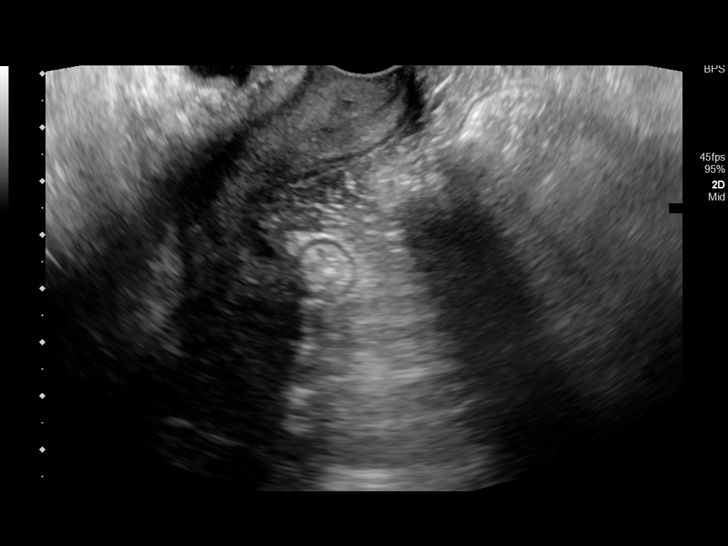
[im 45/71]
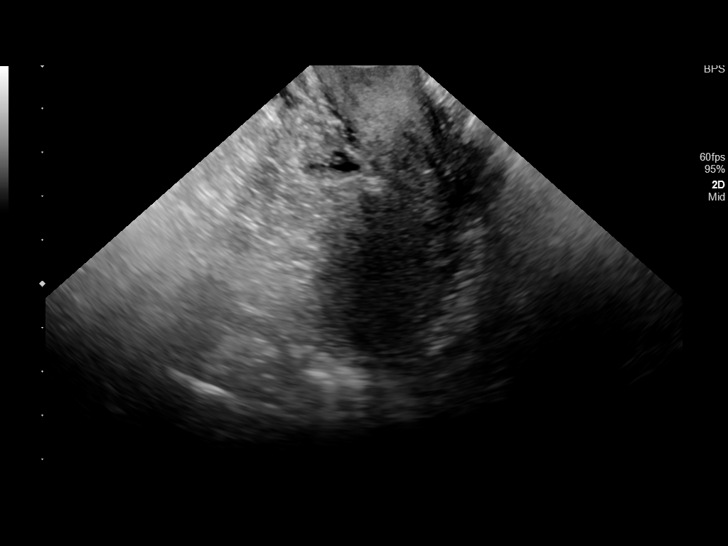
[im 58/71]
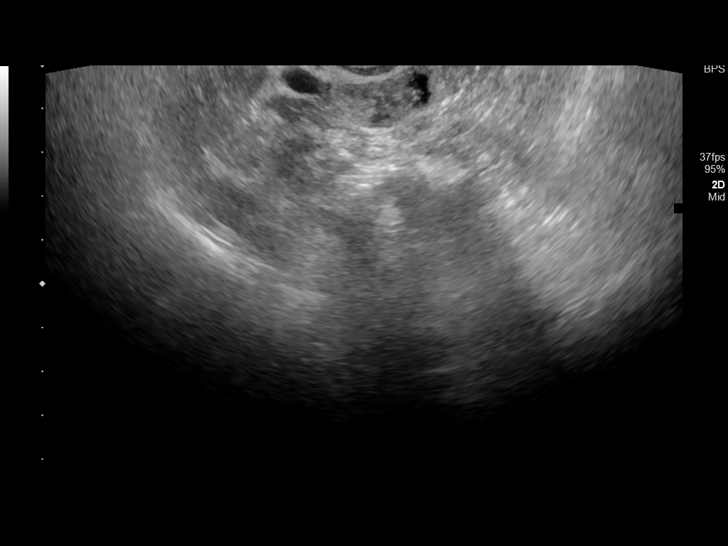
[im 71/71]
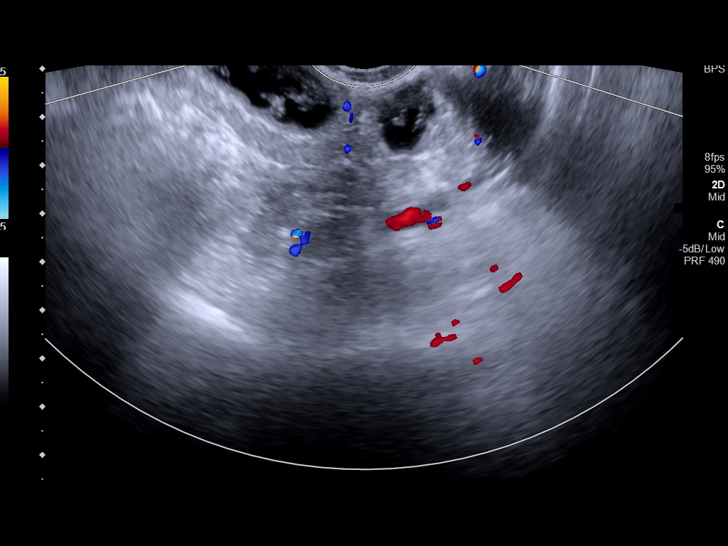

[13 of 25 positions shown; findings below may reference images not displayed]

FINDINGS: Uterus

Measurements: 9.1 x 2.0 x 4.1 cm = volume: 40 mL. Retroverted, with
subsequent suboptimal visualization due to obscuration by bowel gas.
No definite uterine mass visualized

Endometrium

Unable to adequately visualize due to limited visualization of the
upper uterus is a result of retroversion and bowel gas, as well as
patient unable to tolerate transducer pressure.

Right ovary

Surgically absent

Left ovary

Surgically absent

Other findings

No free pelvic fluid or adnexal masses.
IMPRESSION: Surgical absence of the ovaries.

Suboptimal visualization of the uterus and nonvisualization of the
endometrium secondary to retroversion, bowel gas, and patient
intolerance of pressure.

No gross pelvic sonographic abnormalities identified.

## 2019-09-22 MED ORDER — SULFAMETHOXAZOLE-TRIMETHOPRIM 800-160 MG PO TABS: 1.0000 | ORAL_TABLET | Freq: Two times a day (BID) | ORAL | 0 refills | Status: DC

## 2019-09-22 MED ORDER — OXYCODONE-ACETAMINOPHEN 5-325 MG PO TABS
2.0000 | ORAL_TABLET | Freq: Once | ORAL | Status: AC
  Administered 2019-09-22: 2 via ORAL
  Filled 2019-09-22: qty 2

## 2019-09-22 MED ORDER — SULFAMETHOXAZOLE-TRIMETHOPRIM 800-160 MG PO TABS
1.0000 | ORAL_TABLET | Freq: Once | ORAL | Status: AC
  Administered 2019-09-22: 11:00:00 1 via ORAL
  Filled 2019-09-22: qty 1

## 2019-09-22 MED ORDER — PHENAZOPYRIDINE HCL 200 MG PO TABS
200.0000 mg | ORAL_TABLET | Freq: Once | ORAL | Status: AC
  Administered 2019-09-22: 200 mg via ORAL
  Filled 2019-09-22: qty 1

## 2019-09-22 MED ORDER — OXYCODONE-ACETAMINOPHEN 5-325 MG PO TABS: 1.0000 | ORAL_TABLET | Freq: Three times a day (TID) | ORAL | 0 refills | Status: DC | PRN

## 2019-09-22 MED ORDER — PHENAZOPYRIDINE HCL 200 MG PO TABS: 200.0000 mg | ORAL_TABLET | Freq: Three times a day (TID) | ORAL | 0 refills | Status: DC | PRN

## 2019-09-22 NOTE — ED Notes (Addendum)
Patient transported to Ultrasound 

## 2019-09-22 NOTE — ED Provider Notes (Signed)
[facility] Emergency Department Provider Note       Time seen: ----------------------------------------- 10:20 AM on 09/22/2019 -----------------------------------------   I have reviewed the triage vital signs and the nursing notes.  HISTORY   Chief Complaint Vaginal Bleeding    HPI Kristen Hudson is a 53 y.o. female with a history of GERD, seizures, chronic pelvic pain who presents to the ED for lower abdominal cramping with blood that initially she thought was vaginal after she wiped.  She is having frank blood come out in her urine.  She describes suprapubic pain.  She also has pressure when she goes to the bathroom.  Pain is 10 out of 10.  She denies fevers, chills or other complaints.  Past Medical History:  Diagnosis Date  . Acid reflux   . Seizures [facility])     Patient Active Problem List   Diagnosis Date Noted  . Chronic pelvic pain in female 08/24/2018  . Status post bilateral salpingo-oophorectomy (BSO) 08/24/2018  . History of 3 cesarean sections 08/24/2018  . Vasomotor symptoms due to menopause 08/24/2018  . BMI 33.0-33.9,adult 08/24/2018  . Urinary urgency 08/24/2018  . Urinary frequency 08/24/2018    Past Surgical History:  Procedure Laterality Date  . APPENDECTOMY    . CESAREAN SECTION    . CHOLECYSTECTOMY    . COLON RESECTION    . OVARY SURGERY      Allergies Penicillins  Social History Social History   Tobacco Use  . Smoking status: Current Every Day Smoker    Packs/day: 0.25    Types: Cigarettes  . Smokeless tobacco: Never Used  Substance Use Topics  . Alcohol use: Not Currently    Frequency: Never  . Drug use: Not Currently   Review of Systems Constitutional: Negative for fever. Cardiovascular: Negative for chest pain. Respiratory: Negative for shortness of breath. Gastrointestinal: Positive for lower abdominal pain Genitourinary: Positive for hematuria and dysuria Musculoskeletal: Negative for back  pain. Skin: Negative for rash. Neurological: Negative for headaches, focal weakness or numbness.  All systems negative/normal/unremarkable except as stated in the HPI  ____________________________________________   PHYSICAL EXAM:  VITAL SIGNS: ED Triage Vitals [09/22/19 0959]  Enc Vitals Group     BP (!) 146/88     Pulse Rate 97     Resp 16     Temp 98.2 F (36.8 C)     Temp Source Oral     SpO2 100 %     Weight 192 lb (87.1 kg)     Height 5\' 2"  (1.575 m)     Head Circumference      Peak Flow      Pain Score 10     Pain Loc      Pain Edu?      Excl. in [place]?    Constitutional: Alert and oriented. Well appearing and in no distress. Eyes: Conjunctivae are normal. Normal extraocular movements. Cardiovascular: Normal rate, regular rhythm. No murmurs, rubs, or gallops. Respiratory: Normal respiratory effort without tachypnea nor retractions. Breath sounds are clear and equal bilaterally. No wheezes/rales/rhonchi. Gastrointestinal: Nonfocal tenderness, no rebound or guarding.  Normal bowel sounds. Musculoskeletal: Nontender with normal range of motion in extremities. No lower extremity tenderness nor edema. Neurologic:  Normal speech and language. No gross focal neurologic deficits are appreciated.  Skin:  Skin is warm, dry and intact. No rash noted. Psychiatric: Mood and affect are normal. Speech and behavior are normal.  ____________________________________________  ED COURSE:  As part of my medical decision  making, I reviewed the following data within the [place] History obtained from family if available, nursing notes, old chart and ekg, as well as notes from prior ED visits. Patient presented for abdominal pain and hematuria, we will assess with labs and imaging as indicated at this time.   Procedures  Kristen Hudson was evaluated in Emergency Department on 09/22/2019 for the symptoms described in the history of present illness. She was evaluated in the  context of the global COVID-19 pandemic, which necessitated consideration that the patient might be at risk for infection with the SARS-CoV-2 virus that causes COVID-19. Institutional protocols and algorithms that pertain to the evaluation of patients at risk for COVID-19 are in a state of rapid change based on information released by regulatory bodies including the CDC and federal and state organizations. These policies and algorithms were followed during the patient's care in the ED.  ____________________________________________   LABS (pertinent positives/negatives)  Labs Reviewed  COMPREHENSIVE METABOLIC PANEL - Abnormal; Notable for the following components:      Result Value   Creatinine, Ser 1.04 (*)    All other components within normal limits  CBC - Abnormal; Notable for the following components:   WBC 13.3 (*)    All other components within normal limits  URINALYSIS, COMPLETE (UACMP) WITH MICROSCOPIC - Abnormal; Notable for the following components:   Color, Urine STRAW (*)    APPearance HAZY (*)    Specific Gravity, Urine 1.002 (*)    Hgb urine dipstick LARGE (*)    Leukocytes,Ua LARGE (*)    WBC, UA >50 (*)    Bacteria, UA RARE (*)    All other components within normal limits  URINE CULTURE  LIPASE, BLOOD  CK    RADIOLOGY  Pelvic ultrasound IMPRESSION:  1. 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 This calcification appears stable.   2. Mild periventricular small vessel disease. No acute infarct  evident. No mass or hemorrhage.   3. Foci of arterial vascular calcification noted.   4. Mucosal thickening in several ethmoid air cells.   ____________________________________________   DIFFERENTIAL DIAGNOSIS   UTI, pyelonephritis, vaginal bleeding unlikely, coagulopathy, chronic pain  FINAL ASSESSMENT AND PLAN  Cystitis, abdominal pain   Plan: The patient  had presented for hematuria and lower abdominal pain. Patient's labs indicated likely urinary tract infection. Patient's imaging did not reveal any acute process.  She will be given antibiotics and Pyridium and is cleared for outpatient follow-up.   Laurence Aly, MD    Note: This note was generated in part or whole with voice recognition software. Voice recognition is usually quite accurate but there are transcription errors that can and very often do occur. I apologize for any typographical errors that were not detected and corrected.     Earleen Newport, MD 09/22/19 [id number]

## 2019-09-22 NOTE — ED Notes (Signed)
Pt returned from ultrasound

## 2019-09-22 NOTE — ED Triage Notes (Signed)
Reports lower abdominal cramping and vaginal bleeding that started this AM. Denies GI bleeding. Pt reports pressure when using restroom. Pt alert and oriented X4, cooperative, RR even and unlabored, color WNL. Pt in NAD.

## 2019-09-24 LAB — URINE CULTURE: Culture: 50000 — AB

## 2019-11-02 ENCOUNTER — Ambulatory Visit
Admission: RE | Admit: 2019-11-02 | Discharge: 2019-11-02 | Disposition: A | Discharge status: Home / Self Care | Payer: Medicare HMO | Source: Ambulatory Visit | Attending: Internal Medicine | Admitting: Internal Medicine

## 2019-11-02 DIAGNOSIS — Z1231 Encounter for screening mammogram for malignant neoplasm of breast: Secondary | ICD-10-CM | POA: Insufficient documentation

## 2019-11-02 IMAGING — MG DIGITAL SCREENING BILAT W/ TOMO W/ CAD
6 of 12 series · 6 of 36 positions shown · non-contrast
Comparison: Previous exam(s).

CLINICAL DATA: Screening.

EXAM:
DIGITAL SCREENING BILATERAL MAMMOGRAM WITH TOMO AND CAD

[L CC synth-2D (1 of 2)]
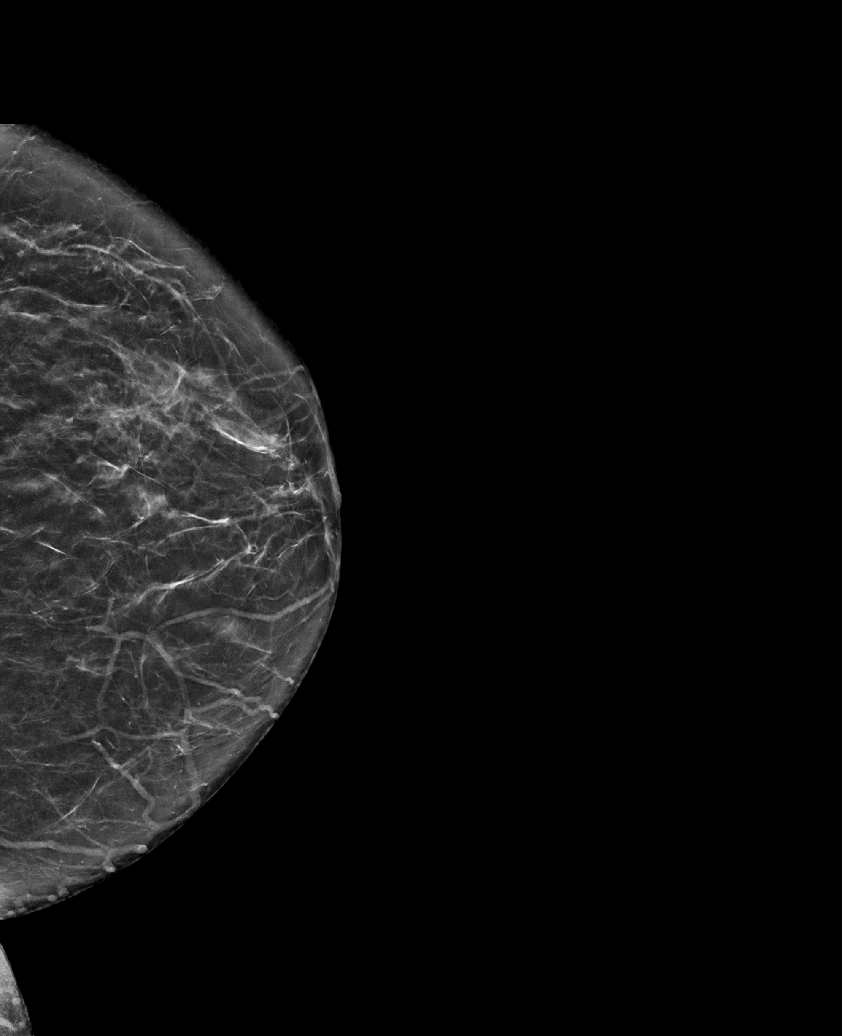

[L CC synth-2D (2 of 2)]
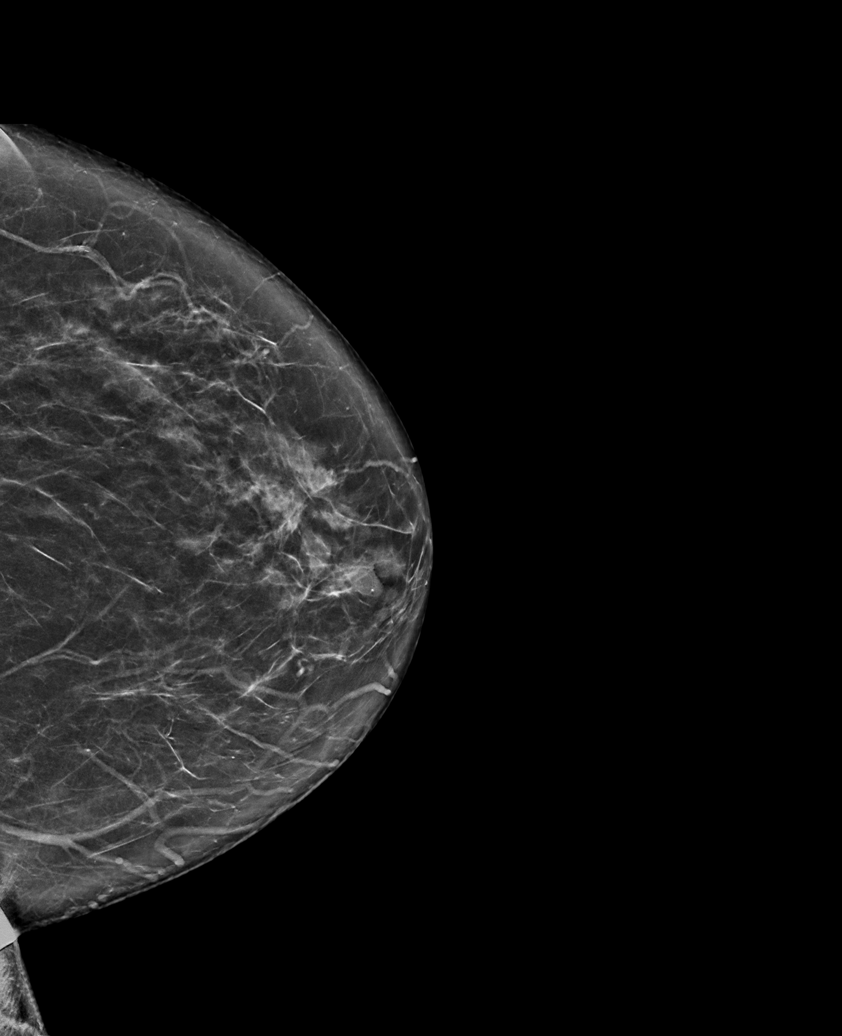

[R CC synth-2D]
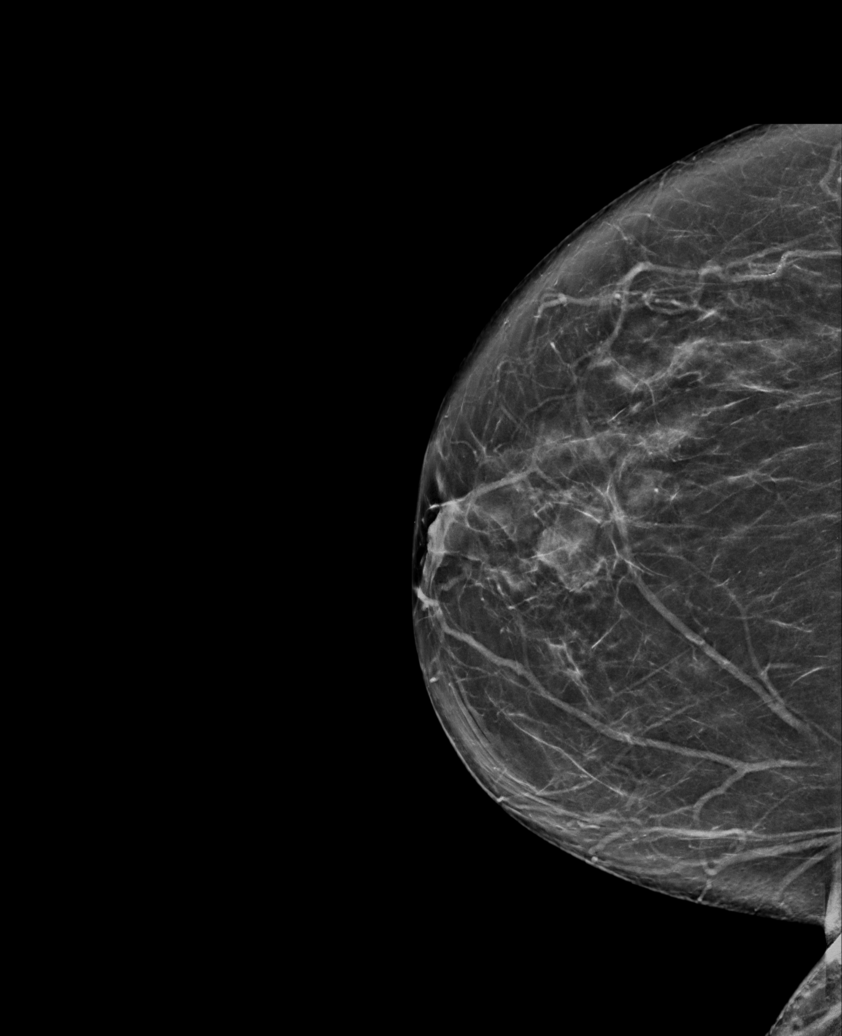

[R MLO synth-2D (1 of 2)]
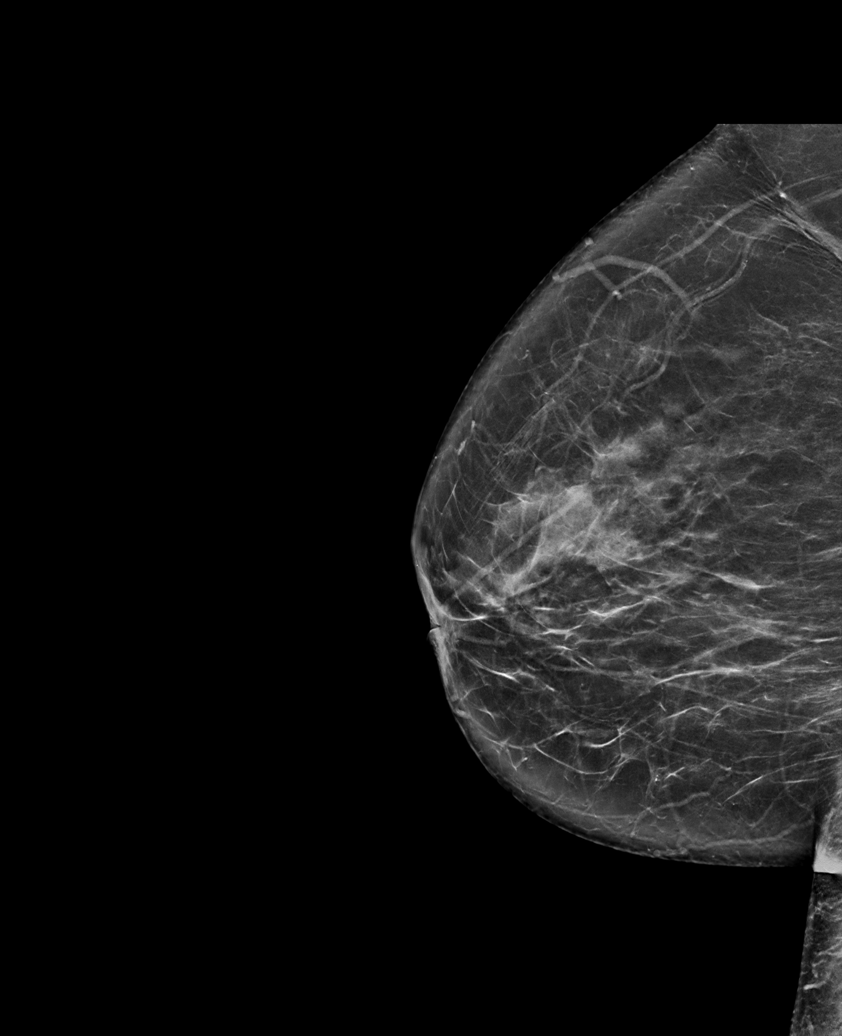

[R MLO synth-2D (2 of 2)]
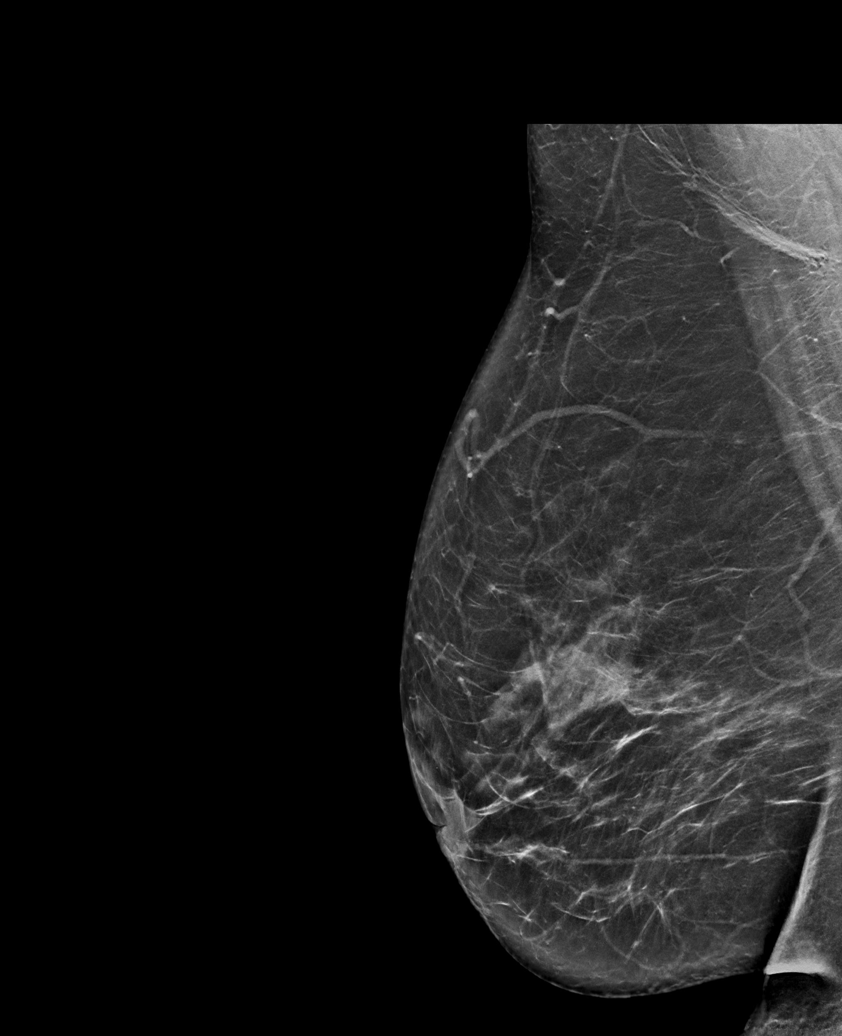

[L MLO synth-2D]
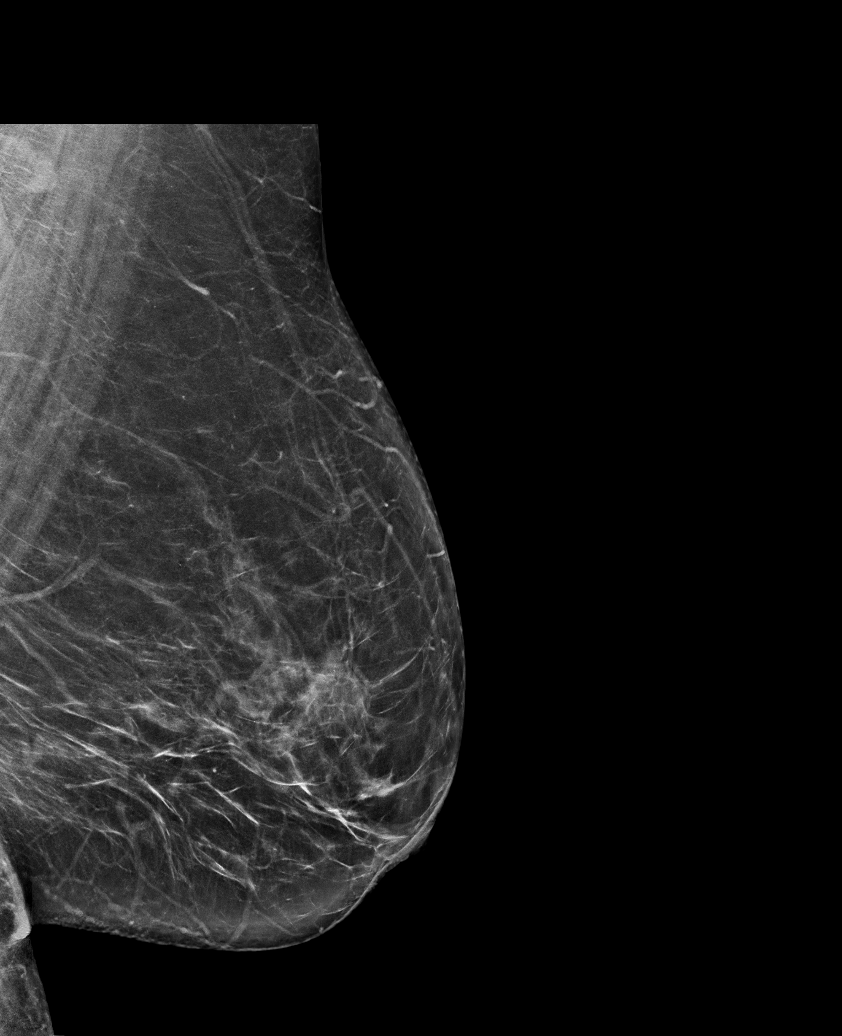

[6 of 36 positions shown; findings below may reference images not displayed]

ACR Breast Density Category b: There are scattered areas of
fibroglandular density.
FINDINGS: There are no findings suspicious for malignancy. Images were
processed with CAD.
IMPRESSION: No mammographic evidence of malignancy. A result letter of this
screening mammogram will be mailed directly to the patient.

RECOMMENDATION:
Screening mammogram in one year. (Code:[TQ])

BI-RADS CATEGORY  1: Negative.

## 2019-11-05 ENCOUNTER — Other Ambulatory Visit: Payer: Self-pay

## 2019-11-05 ENCOUNTER — Emergency Department: Payer: Medicare HMO

## 2019-11-05 ENCOUNTER — Emergency Department
Admission: EM | Admit: 2019-11-05 | Discharge: 2019-11-05 | Disposition: A | Discharge status: Home / Self Care | Payer: Medicare HMO | Attending: Emergency Medicine | Admitting: Emergency Medicine

## 2019-11-05 DIAGNOSIS — R200 Anesthesia of skin: Secondary | ICD-10-CM | POA: Diagnosis not present

## 2019-11-05 DIAGNOSIS — F17210 Nicotine dependence, cigarettes, uncomplicated: Secondary | ICD-10-CM | POA: Diagnosis not present

## 2019-11-05 DIAGNOSIS — H5040 Unspecified heterotropia: Secondary | ICD-10-CM | POA: Insufficient documentation

## 2019-11-05 DIAGNOSIS — M5412 Radiculopathy, cervical region: Secondary | ICD-10-CM | POA: Insufficient documentation

## 2019-11-05 DIAGNOSIS — Z79899 Other long term (current) drug therapy: Secondary | ICD-10-CM | POA: Diagnosis not present

## 2019-11-05 DIAGNOSIS — R29818 Other symptoms and signs involving the nervous system: Secondary | ICD-10-CM | POA: Diagnosis not present

## 2019-11-05 DIAGNOSIS — H509 Unspecified strabismus: Secondary | ICD-10-CM | POA: Diagnosis not present

## 2019-11-05 DIAGNOSIS — M5416 Radiculopathy, lumbar region: Secondary | ICD-10-CM

## 2019-11-05 LAB — DIFFERENTIAL
Abs Immature Granulocytes: 0.02 10*3/uL (ref 0.00–0.07)
Basophils Absolute: 0.1 10*3/uL (ref 0.0–0.1)
Basophils Relative: 1 %
Eosinophils Absolute: 0.3 10*3/uL (ref 0.0–0.5)
Eosinophils Relative: 3 %
Immature Granulocytes: 0 %
Lymphocytes Relative: 25 %
Lymphs Abs: 2.2 10*3/uL (ref 0.7–4.0)
Monocytes Absolute: 0.6 10*3/uL (ref 0.1–1.0)
Monocytes Relative: 6 %
Neutro Abs: 5.8 10*3/uL (ref 1.7–7.7)
Neutrophils Relative %: 65 %

## 2019-11-05 LAB — CBC
HCT: 39.2 % (ref 36.0–46.0)
Hemoglobin: 13.1 g/dL (ref 12.0–15.0)
MCH: 31 pg (ref 26.0–34.0)
MCHC: 33.4 g/dL (ref 30.0–36.0)
MCV: 92.7 fL (ref 80.0–100.0)
Platelets: 296 10*3/uL (ref 150–400)
RBC: 4.23 MIL/uL (ref 3.87–5.11)
RDW: 12.1 % (ref 11.5–15.5)
WBC: 9 10*3/uL (ref 4.0–10.5)
nRBC: 0 % (ref 0.0–0.2)

## 2019-11-05 LAB — COMPREHENSIVE METABOLIC PANEL
ALT: 16 U/L (ref 0–44)
AST: 17 U/L (ref 15–41)
Albumin: 4.2 g/dL (ref 3.5–5.0)
Alkaline Phosphatase: 72 U/L (ref 38–126)
Anion gap: 7 (ref 5–15)
BUN: 17 mg/dL (ref 6–20)
CO2: 25 mmol/L (ref 22–32)
Calcium: 9 mg/dL (ref 8.9–10.3)
Chloride: 107 mmol/L (ref 98–111)
Creatinine, Ser: 0.9 mg/dL (ref 0.44–1.00)
GFR calc Af Amer: >60 mL/min (ref >60)
GFR calc non Af Amer: >60 mL/min (ref >60)
Glucose, Bld: 103 mg/dL — ABNORMAL HIGH (ref 70–99)
Potassium: 4.4 mmol/L (ref 3.5–5.1)
Sodium: 139 mmol/L (ref 135–145)
Total Bilirubin: 0.5 mg/dL (ref 0.3–1.2)
Total Protein: 7 g/dL (ref 6.5–8.1)

## 2019-11-05 IMAGING — MR MR HEAD W/O CM
12 series · 42 of 48 positions shown · non-contrast
Comparison: Head CT [6F] hours today.

CLINICAL DATA: 53-year-old female with right upper extremity
numbness, difficulty walking. Small area of abnormal density along
the body of the right lateral ventricle on earlier CT.

EXAM:
MRI HEAD WITHOUT CONTRAST
TECHNIQUE: Multiplanar, multiecho pulse sequences of the brain and surrounding
structures were obtained without intravenous contrast.

[Series 5: ax dwi_tracew · axial · 3.0mm · 0.60mm/px · z∈[-16,+135]mm · 4 of 48 slices shown]
[im 1/48]
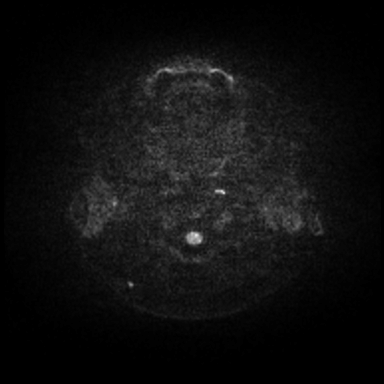
[im 16/48]
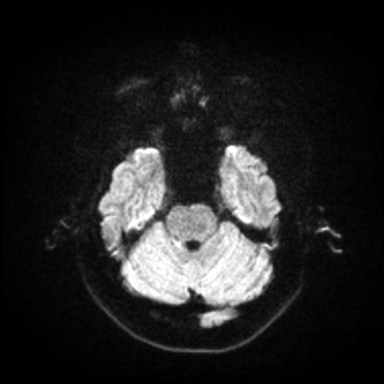
[im 32/48]
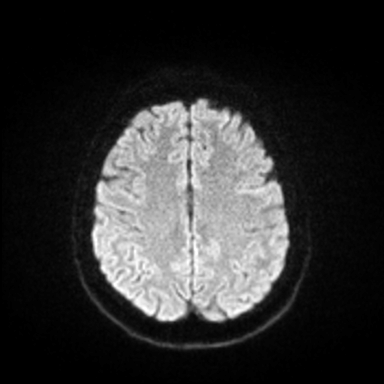
[im 48/48]
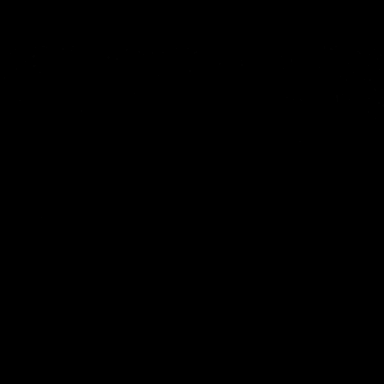

[Series 6: ax dwi_adc · axial · 3.0mm · 0.60mm/px · z∈[-16,+119]mm · 3 of 42 slices shown]
[im 1/42]
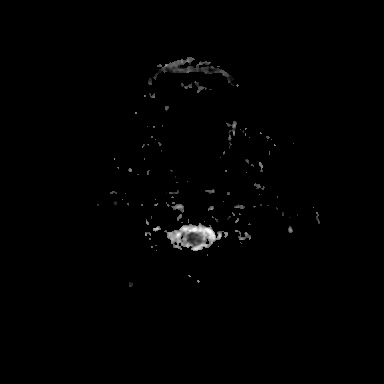
[im 21/42]
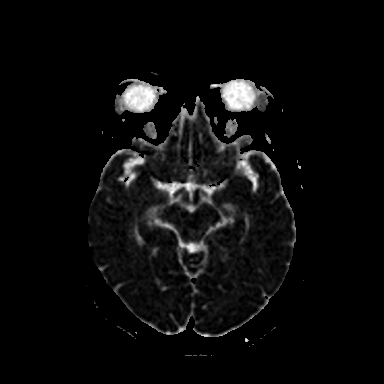
[im 42/42]
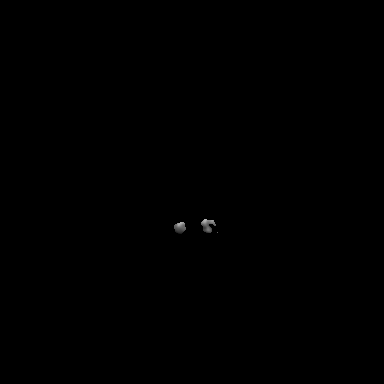

[Series 7: cor dwi_tracew · coronal · 5.0mm · 0.60mm/px · 3 of 40 slices shown]
[im 1/40]
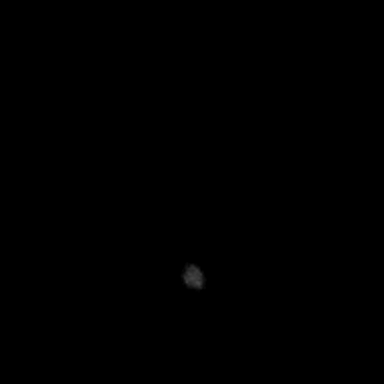
[im 20/40]
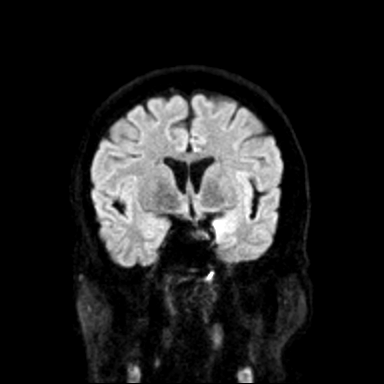
[im 40/40]
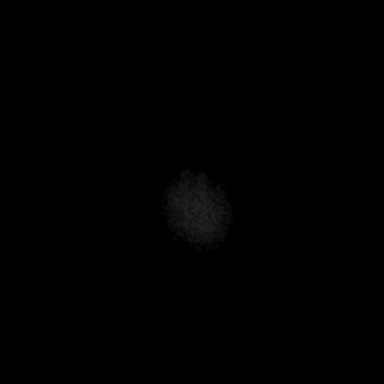

[Series 8: cor dwi_adc · coronal · 5.0mm · 0.60mm/px · 3 of 39 slices shown]
[im 1/39]
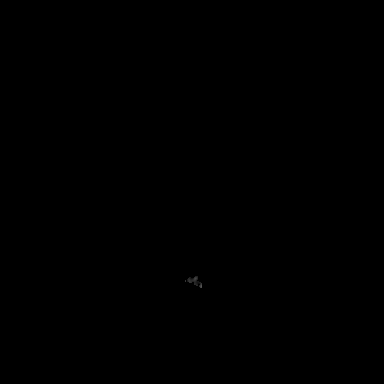
[im 20/39]
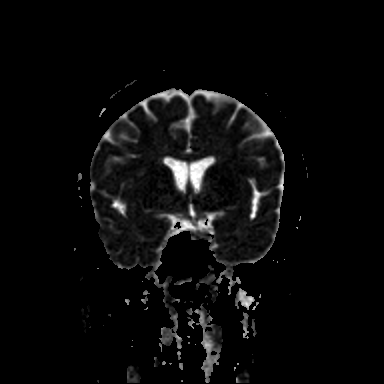
[im 39/39]
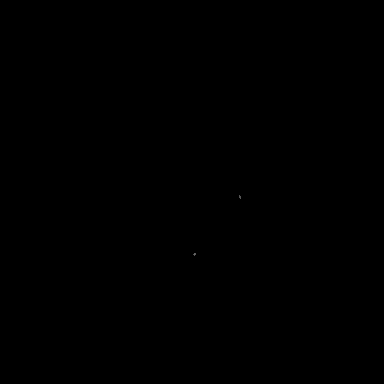

[Series 9: T1 · sagittal · 5.0mm · 0.62mm/px · 2 of 24 slices shown (1 of 2)]
[im 1/24]
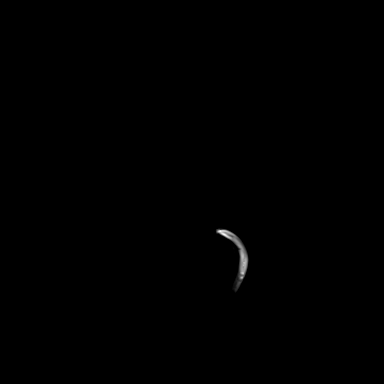
[im 24/24]
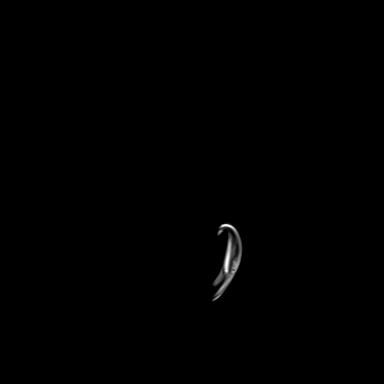

[Series 10: T2 · axial · 5.0mm · 0.53mm/px · z∈[-13,+140]mm · 2 of 27 slices shown (1 of 2)]
[im 1/27]
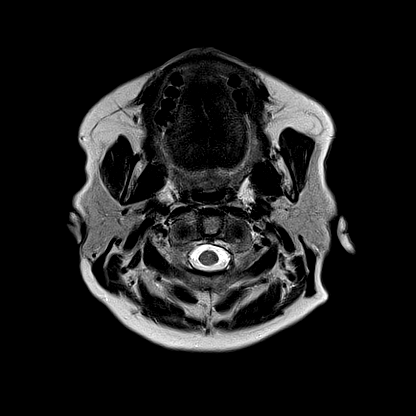
[im 27/27]
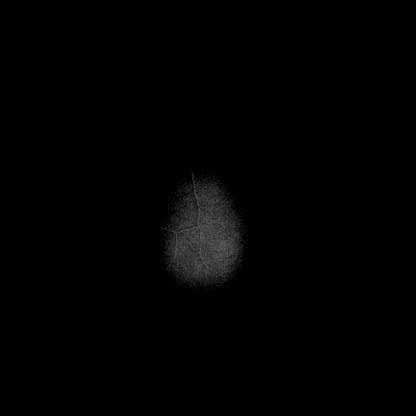

[Series 11: mag_images · axial · 3.0mm · 0.90mm/px · z∈[-24,+150]mm · 4 of 60 slices shown]
[im 1/60]
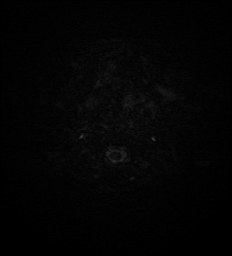
[im 20/60]
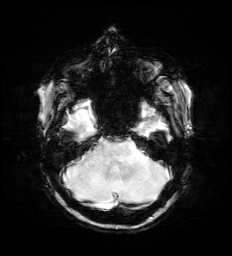
[im 40/60]
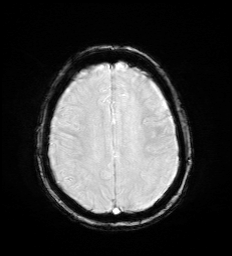
[im 60/60]
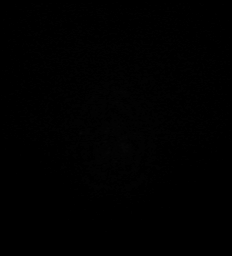

[Series 12: pha_images · axial · 3.0mm · 0.90mm/px · z∈[-21,+150]mm · 4 of 59 slices shown]
[im 1/59]
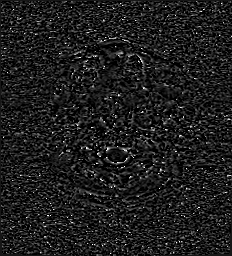
[im 20/59]
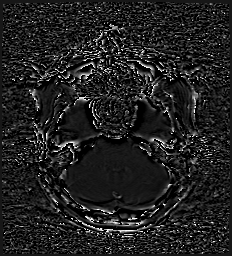
[im 39/59]
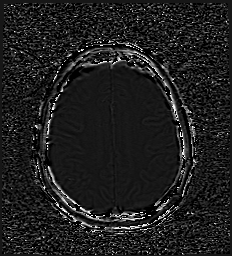
[im 59/59]
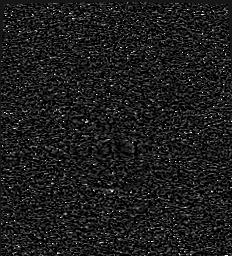

[Series 13: swi_images · axial · 3.0mm · 0.90mm/px · z∈[-24,+91]mm · 3 of 60 slices shown]
[im 1/60]
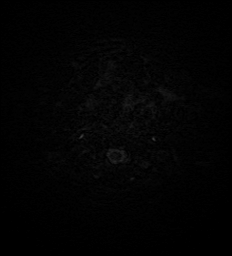
[im 20/60]
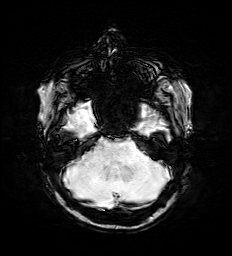
[im 40/60]
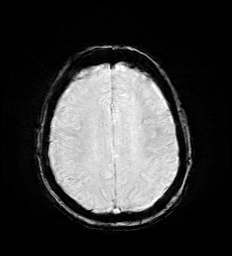

[Series 15: FLAIR · axial · 3.0mm · 0.53mm/px · z∈[-16,+143]mm · 4 of 55 slices shown]
[im 1/55]
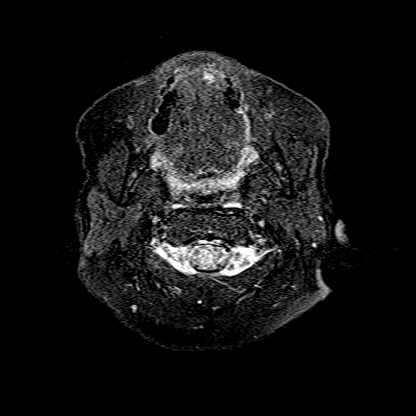
[im 19/55]
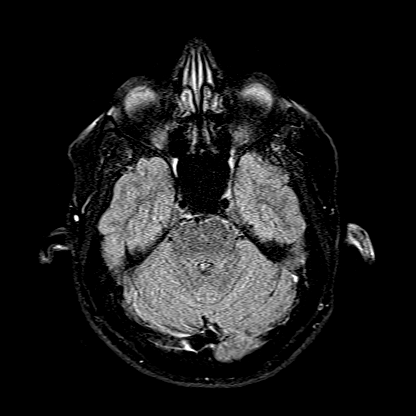
[im 37/55]
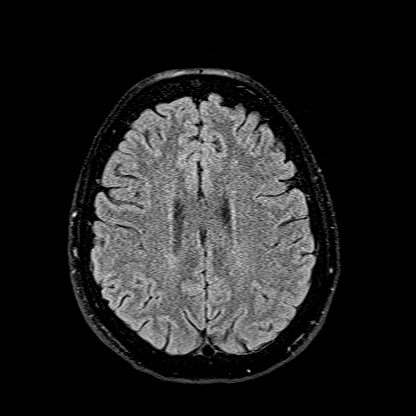
[im 55/55]
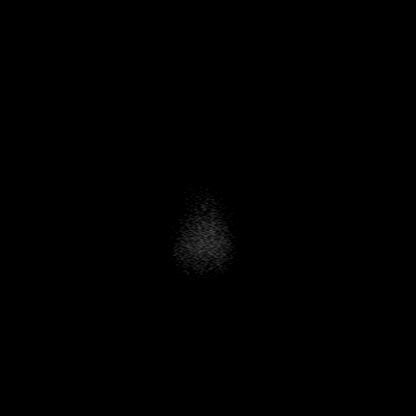

[Series 16: T1 · axial · 1.0mm · 0.98mm/px · z∈[-25,+147]mm · 8 of 175 slices shown (2 of 2)]
[im 1/175]
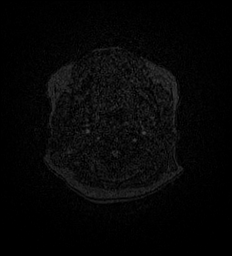
[im 30/175]
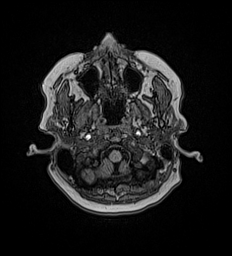
[im 59/175]
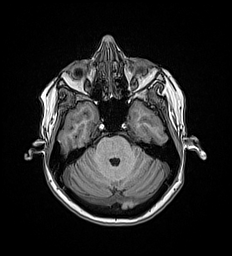
[im 73/175]
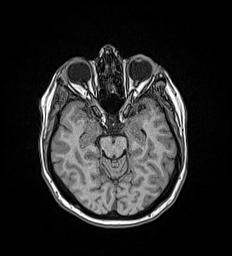
[im 102/175]
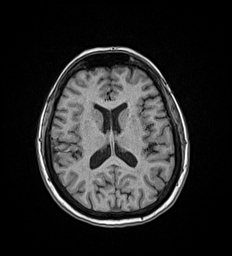
[im 117/175]
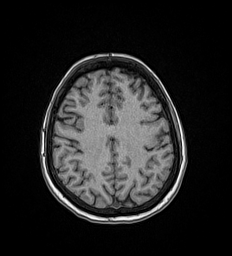
[im 146/175]
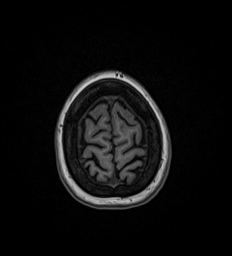
[im 175/175]
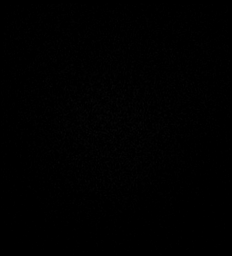

[Series 17: T2 · coronal · 5.0mm · 0.57mm/px · 2 of 29 slices shown (2 of 2)]
[im 1/29]
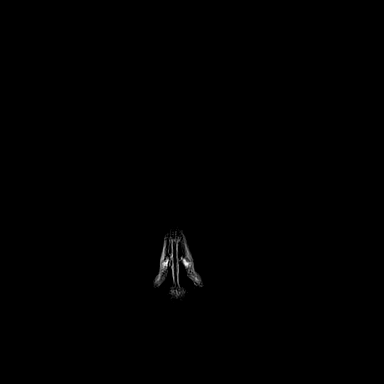
[im 29/29]
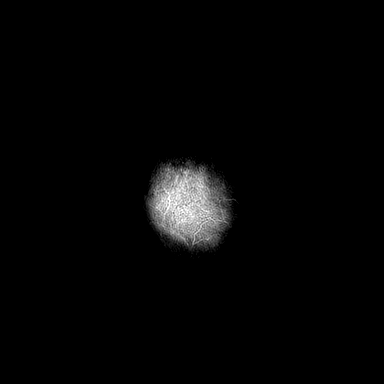

[42 of 48 positions shown; findings below may reference images not displayed]

FINDINGS: Brain: Confirmed 8-9 millimeter area of subependymal gray matter
heterotopia along the right lateral ventricle (series 16, image 108
and series 15, image 35). And there is a second small focus of gray
matter heterotopia at the atrium of that ventricle on series 16,
image 90.

No other migrational abnormality identified.

Normal cerebral volume. No restricted diffusion to suggest acute
infarction. No midline shift, mass effect, evidence of mass lesion,
ventriculomegaly, extra-axial collection or acute intracranial
hemorrhage. Cervicomedullary junction and pituitary are within
normal limits.

Aside from the right lateral ventricle migrational abnormality there
is mild for age scattered nonspecific cerebral white matter T2 and
FLAIR hyperintensity.

No cortical encephalomalacia. No chronic cerebral blood products
identified. The deep gray matter nuclei, brainstem and cerebellum.

Vascular: Major intracranial vascular flow voids are preserved.

Skull and upper cervical spine: Partially visible cervical disc
degeneration at C3-C4. Visualized bone marrow signal is within
normal limits.

Sinuses/Orbits: Negative orbits. Paranasal sinuses and mastoids
remain clear.

Other: Visible internal auditory structures appear normal.
IMPRESSION: 1. Confirmed gray matter heterotopia: two small areas of nodular
subependymal heterotopia along the right lateral ventricle.
2. No superimposed acute intracranial abnormality. Mild for age
nonspecific cerebral white matter signal changes.
3. Partially visible cervical disc degeneration at C3-C4.

## 2019-11-05 IMAGING — CT CT HEAD W/O CM
3 series · 15 of 44 positions shown, 18 images · non-contrast
Comparison: Report from head CT [DATE] (images unavailable).

CLINICAL DATA: Focal neuro deficit, greater than 6 hours, stroke
suspected. Additional history provided: Patient reports right hand
numbness and intermittent right arm numbness with difficulty walking
for the past 2 days.

EXAM:
CT HEAD WITHOUT CONTRAST
TECHNIQUE: Contiguous axial images were obtained from the base of the skull
through the vertex without intravenous contrast.

[Series 2: head wo · axial · 0.47mm/px · z∈[-124,-14]mm · 9 of 27 slices shown, 12 images]
[im 3/27  brain]
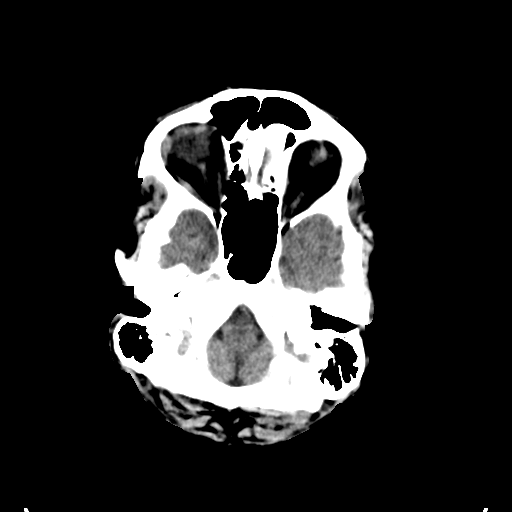
[im 3/27  bone]
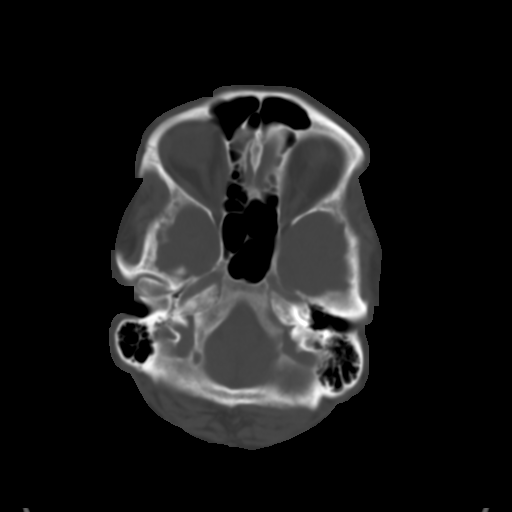
[im 6/27  brain]
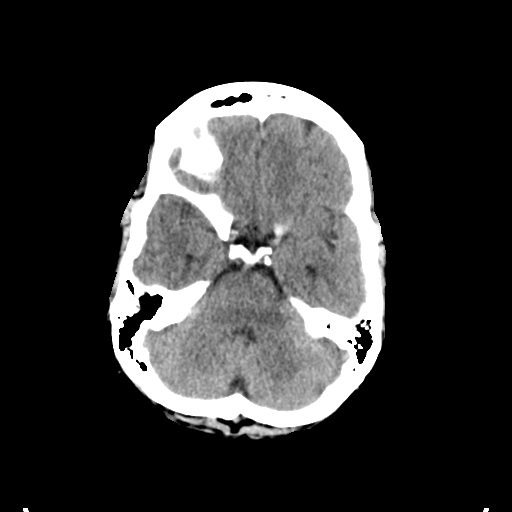
[im 8/27  brain]
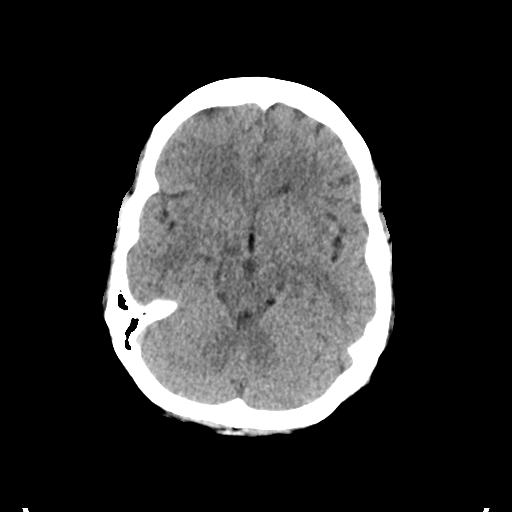
[im 11/27  brain]
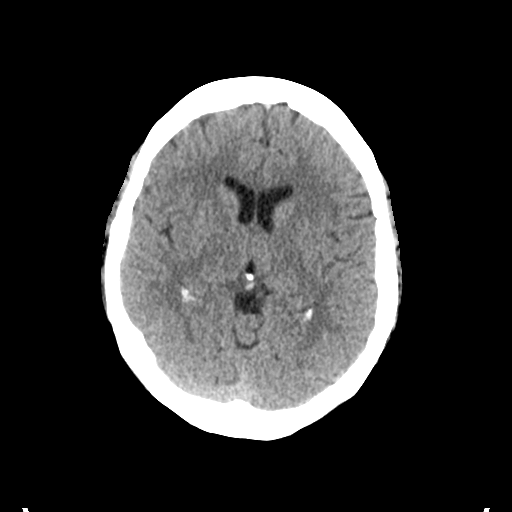
[im 14/27  brain]
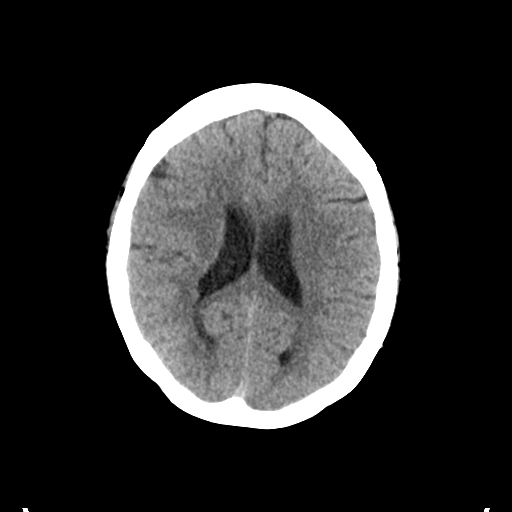
[im 14/27  bone]
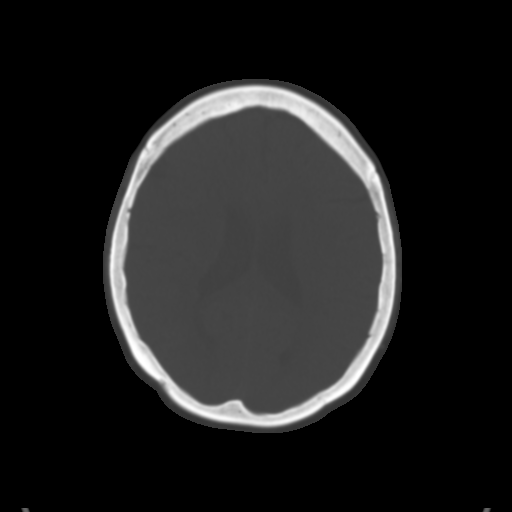
[im 17/27  brain]
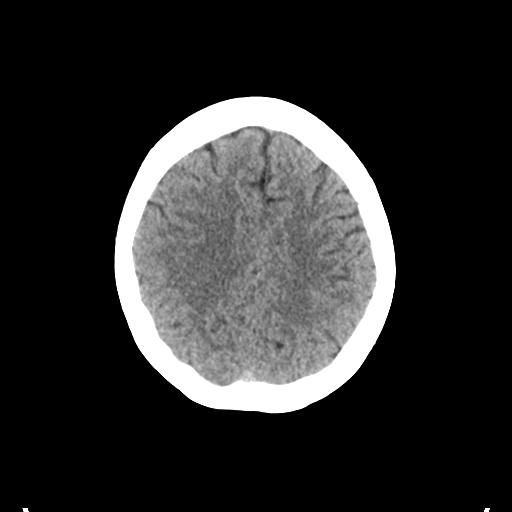
[im 20/27  brain]
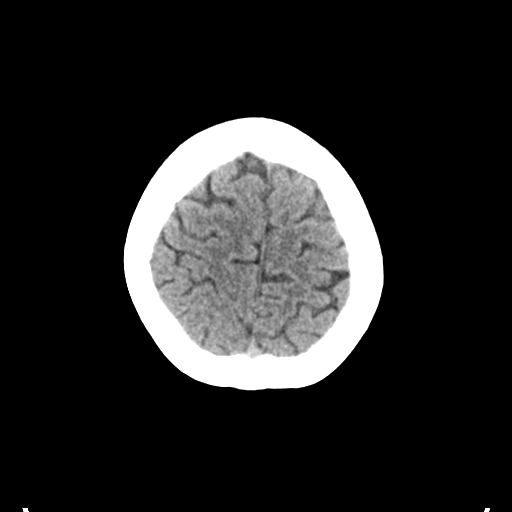
[im 22/27  brain]
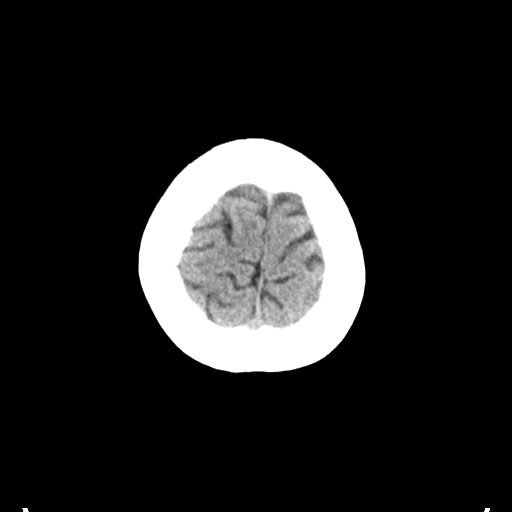
[im 25/27  brain]
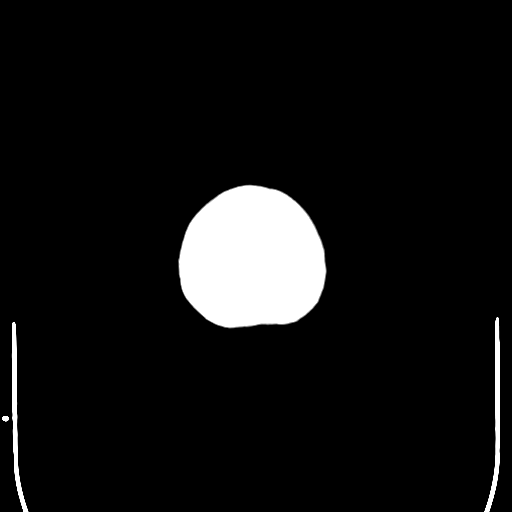
[im 25/27  bone]
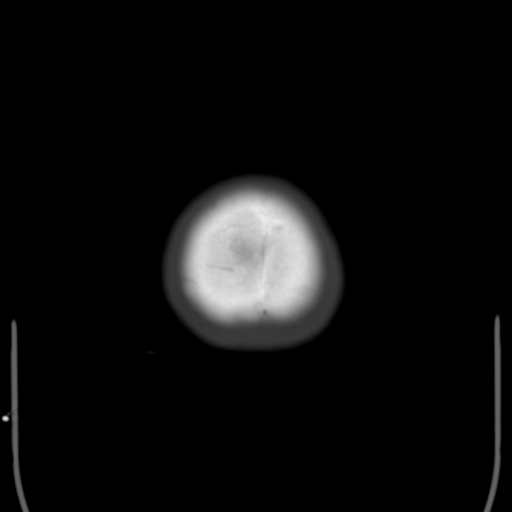

[Series 4: coronal soft tissue · coronal · 0.28mm/px · 3 of 61 slices shown]
[im 21/61  brain]
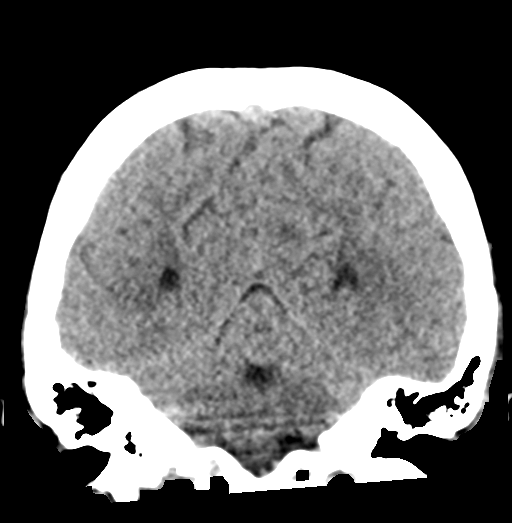
[im 27/61  brain]
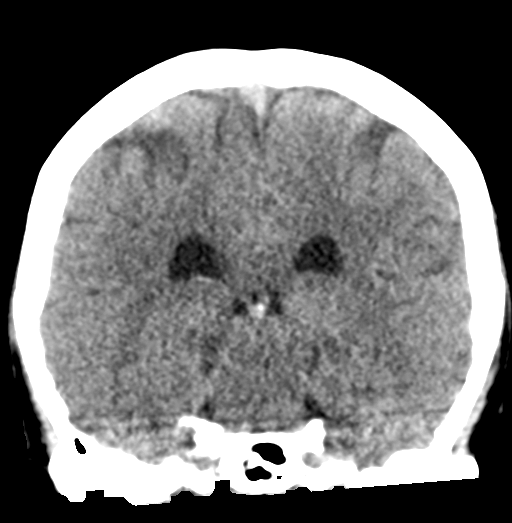
[im 34/61  brain]
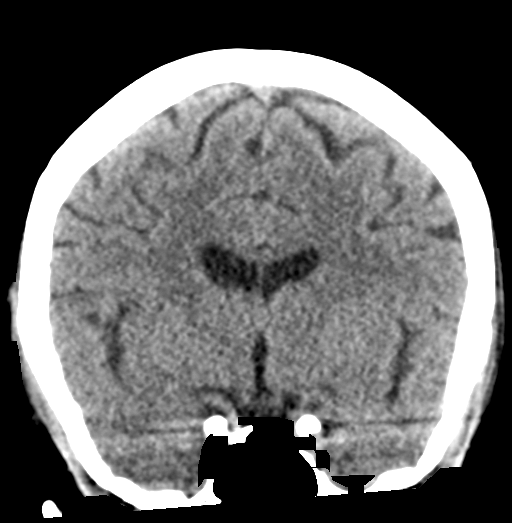

[Series 5: sagittal soft tissue · sagittal · 0.29mm/px · 3 of 49 slices shown]
[im 17/49  brain]
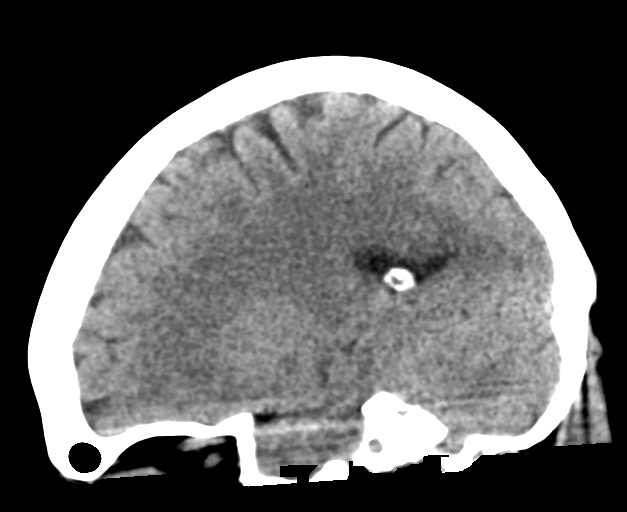
[im 25/49  brain]
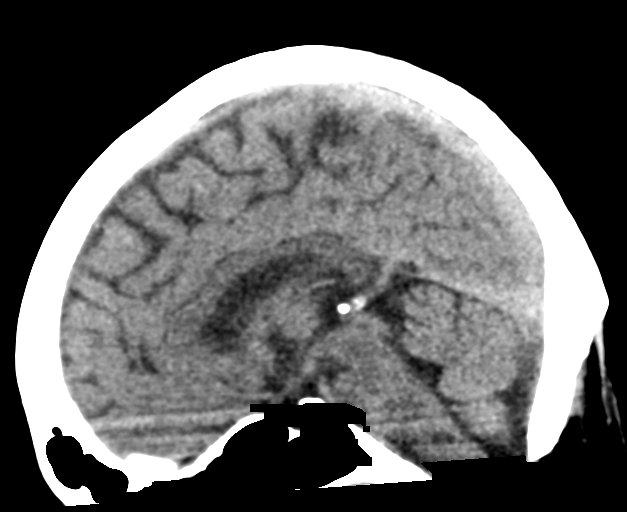
[im 33/49  brain]
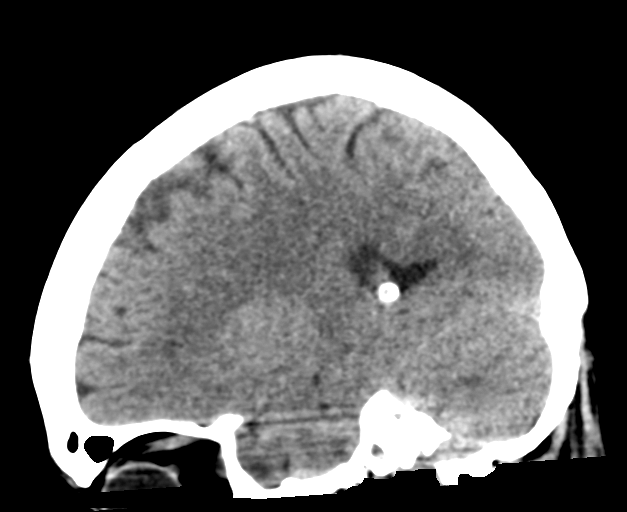

[15 of 44 positions shown; findings below may reference images not displayed]

FINDINGS: Brain:

There is a 9 x 5 mm focus of hyperdensity along the margin of the
body of the right lateral ventricle (series 2, image 16) (series 4,
image 32). This does not have an appearance typical for hemorrhage
but remains otherwise indeterminate. Given patient's history of
seizures, a focus of gray matter heterotopia is questioned.

No demarcated cortical infarction.

No midline shift or extra-axial fluid collection.

Cerebral volume is normal for age.

Vascular: No hyperdense vessel.

Skull: Normal. Negative for fracture or focal lesion.

Sinuses/Orbits: Visualized orbits demonstrate no acute abnormality.
No significant paranasal sinus disease or mastoid effusion at the
imaged levels.
IMPRESSION: 1. No evidence of acute infarct.
2. 9 x 5 mm focus of hyperdensity along the margin of the body of
the right lateral ventricle. This does not have an appearance
typical for hemorrhage but remains otherwise indeterminate. Given
patient's history of seizure, a focus of gray matter heterotopia is
questioned. Brain MRI is recommended for further characterization.

## 2019-11-05 MED ORDER — METHOCARBAMOL 500 MG PO TABS: 500.0000 mg | ORAL_TABLET | Freq: Four times a day (QID) | ORAL | 0 refills | Status: DC

## 2019-11-05 MED ORDER — SODIUM CHLORIDE 0.9% FLUSH: 3.0000 mL | Freq: Once | INTRAVENOUS | Status: DC

## 2019-11-05 MED ORDER — MELOXICAM 15 MG PO TABS: 15.0000 mg | ORAL_TABLET | Freq: Every day | ORAL | 0 refills | Status: DC

## 2019-11-05 NOTE — ED Notes (Signed)
A&O, speaking in complete sentences. States bilat leg shakiness. States "muscles are jumping." pt states her toes feel like they are numb. States "it's been going on for a while but I thought it was my arthritis." states speech and vision are normal. States she doesn't have good balance to begin with. C/o HA at this time. Denies dizziness.

## 2019-11-05 NOTE — ED Notes (Signed)
MRI called and stated they were coming to get pt at this time.

## 2019-11-05 NOTE — ED Provider Notes (Signed)
[facility] Emergency Department Provider Note  ____________________________________________  Time seen: Approximately 1:48 PM  I have reviewed the triage vital signs and the nursing notes.   HISTORY  Chief Complaint Numbness    HPI Kristen Hudson is a 53 y.o. female who presents the emergency department complaining of left arm numbness, bilateral lower extremity numbness and tingling, headache.  Patient states that she believes it to be her "arthritis" however she states that typically only 1 or 2 areas will hurt and then resolved.  Has patient has been complaining of ongoing symptoms she presents the emergency department for evaluation.  She denies any recent trauma to the head, neck, back or any extremity.  She states that she does have a history of recurrent headaches and that the headache is not different than normal.  No visual changes, difficulty formulating thoughts or words.  Patient states with her symptoms currently she has no weakness in bilateral lower extremities but feels like her left arm is slightly weak in addition to the numbness and tingling sensation.  Patient has not tried any medications for his complaint prior to arrival.  Patient states that the headaches, numbness and tingling in the extremities has occurred in the past for many years.  Patient states again that the reason she presents is that it typically resolves on its own and has not done so as well as encompassing more extremities at one time and normal.  Patient has a history of seizures, and reflux.  No complaints with recent seizure-like activity.  Patient denies any neck pain or stiffness, chest pain, shortness of breath, abdominal pain, nausea vomiting, dysuria, polyuria, hematuria, bowel or bladder dysfunction, saddle anesthesia or paresthesias.         Past Medical History:  Diagnosis Date  . Acid reflux   . Seizures [facility])     Patient Active Problem List   Diagnosis Date Noted  .  Chronic pelvic pain in female 08/24/2018  . Status post bilateral salpingo-oophorectomy (BSO) 08/24/2018  . History of 3 cesarean sections 08/24/2018  . Vasomotor symptoms due to menopause 08/24/2018  . BMI 33.0-33.9,adult 08/24/2018  . Urinary urgency 08/24/2018  . Urinary frequency 08/24/2018    Past Surgical History:  Procedure Laterality Date  . APPENDECTOMY    . CESAREAN SECTION    . CHOLECYSTECTOMY    . COLON RESECTION    . OVARY SURGERY      Prior to Admission medications   Medication Sig Start Date End Date Taking? Authorizing Provider  cyclobenzaprine (FLEXERIL) 5 MG tablet TAKE 1 TO 2 TABLETS BY MOUTH THREE TIMES DAILY AS NEEDED FOR MUSCLE SPASM 05/04/19   [provider]  diazepam (VALIUM) 5 MG tablet Take 1 tablet (5 mg total) by mouth every 6 (six) hours as needed (vaginal valium). Patient taking differently: Take 5 mg by mouth every 6 (six) hours as needed.  12/13/18   Philip Aspen, CNM  gabapentin (NEURONTIN) 300 MG capsule 300mg  qHS x 7 d, then 600mg  qHS x 7 d, then 300mg  qAM and 600mg  qHS x 7 d, then 300mg  qAM, 300mg  q-afternoon and 600mg  qHS 05/04/19   [provider]  levETIRAcetam (KEPPRA) 500 MG tablet Take 500 mg by mouth 2 (two) times daily.  12/16/17   [provider]  meloxicam (MOBIC) 15 MG tablet Take 1 tablet (15 mg total) by mouth daily. 11/05/19   Ryeleigh Santore, Charline Bills, PA-C  methocarbamol (ROBAXIN) 500 MG tablet Take 1 tablet (500 mg total) by mouth 4 (  four) times daily. 11/05/19   Nickolis Diel, Charline Bills, PA-C  oxyCODONE (ROXICODONE) 5 MG immediate release tablet Take 1 tablet (5 mg total) by mouth every 8 (eight) hours as needed. Patient not taking: Reported on 09/22/2019 11/16/18 11/16/19  Sherrie George B, FNP  oxyCODONE-acetaminophen (PERCOCET) 5-325 MG tablet Take 1 tablet by mouth every 8 (eight) hours as needed. 09/22/19   Earleen Newport, MD  phenazopyridine (PYRIDIUM) 200 MG tablet Take 1 tablet (200 mg total) by mouth 3  (three) times daily as needed for pain. 09/22/19 09/21/20  Earleen Newport, MD  phenytoin (DILANTIN) 100 MG ER capsule Take 200 mg by mouth 2 (two) times daily. 06/03/18   [provider]  phenytoin (DILANTIN) 125 MG/5ML suspension Take 125 mg by mouth 2 (two) times daily.     [provider]  sulfamethoxazole-trimethoprim (BACTRIM DS) 800-160 MG tablet Take 1 tablet by mouth 2 (two) times daily. 09/22/19   Earleen Newport, MD  traZODone (DESYREL) 50 MG tablet Take 50 mg by mouth daily. 08/06/19   [provider]    Allergies Penicillins  Family History  Problem Relation Age of Onset  . Breast cancer Mother 59  . Colon cancer Father   . Diabetes Father   . Ovarian cancer Neg Hx     Social History Social History   Tobacco Use  . Smoking status: Current Every Day Smoker    Packs/day: 0.25    Types: Cigarettes  . Smokeless tobacco: Never Used  Substance Use Topics  . Alcohol use: Not Currently    Frequency: Never  . Drug use: Not Currently     Review of Systems  Constitutional: No fever/chills Eyes: No visual changes. No discharge ENT: No upper respiratory complaints. Cardiovascular: no chest pain. Respiratory: no cough. No SOB. Gastrointestinal: No abdominal pain.  No nausea, no vomiting.  No diarrhea.  No constipation. Genitourinary: Negative for dysuria. No hematuria Musculoskeletal: Negative for musculoskeletal pain. Skin: Negative for rash, abrasions, lacerations, ecchymosis. Neurological: Positive for intermittent headaches, none currently.  Patient reports numbness and slight weakness of the left upper extremity, numbness and tingling of bilateral lower extremities. 10-point ROS otherwise negative.  ____________________________________________   PHYSICAL EXAM:  VITAL SIGNS: ED Triage Vitals  Enc Vitals Group     BP 11/05/19 0940 107/70     Pulse Rate 11/05/19 0940 80     Resp 11/05/19 0940 18     Temp 11/05/19 0942 98.1 F  (36.7 C)     Temp Source 11/05/19 0940 Oral     SpO2 11/05/19 0940 97 %     Weight 11/05/19 0941 193 lb (87.5 kg)     Height 11/05/19 0941 5\' 4"  (1.626 m)     Head Circumference --      Peak Flow --      Pain Score 11/05/19 0941 10     Pain Loc --      Pain Edu? --      Excl. in [place]? --      Constitutional: Alert and oriented. Well appearing and in no acute distress. Eyes: Conjunctivae are normal. PERRL. EOMI. Head: Atraumatic. ENT:      Ears:       Nose: No congestion/rhinnorhea.      Mouth/Throat: Mucous membranes are moist.  Neck: No stridor.  Mild diffuse cervical spine tenderness to palpation both midline and bilateral paraspinal muscle regions.  No palpable abnormality or step-off.  Radial pulse intact bilateral upper extremities.  Sensation intact and  equal in all dermatomal distributions bilateral upper extremity.  Equal grip strength bilateral upper extremities.  Good approximation of all digits.  Cardiovascular: Normal rate, regular rhythm. Normal S1 and S2.  Good peripheral circulation. Respiratory: Normal respiratory effort without tachypnea or retractions. Lungs CTAB. Good air entry to the bases with no decreased or absent breath sounds. Gastrointestinal: Bowel sounds 4 quadrants. Soft and nontender to palpation. No guarding or rigidity. No palpable masses. No distention. No CVA tenderness. Musculoskeletal: Full range of motion to all extremities. No gross deformities appreciated.  Visualization of the spine reveals no visible abnormality.  Patient reports diffuse tenderness from the cervical spine through the lumbar spine with no point specific tenderness.  No palpable abnormality.  Patient is diffusely tender to palpation in bilateral paraspinal muscle regions and cervical, thoracic and lumbar spine.  No tenderness to palpation of bilateral sciatic notches.  Negative straight leg raise bilaterally.  Dorsalis pedis pulse intact bilateral lower extremities.  Patient reports  good sensation in all dermatomal distributions with no inequalities between the bilateral lower extremity.  Equal strength bilateral lower extremities. Neurologic:  Normal speech and language. No gross focal neurologic deficits are appreciated.  Cranial nerves II through XII grossly intact.  Negative Romberg's or pronator drift. Skin:  Skin is warm, dry and intact. No rash noted. Psychiatric: Mood and affect are normal. Speech and behavior are normal. Patient exhibits appropriate insight and judgement.   ____________________________________________   LABS (all labs ordered are listed, but only abnormal results are displayed)  Labs Reviewed  COMPREHENSIVE METABOLIC PANEL - Abnormal; Notable for the following components:      Result Value   Glucose, Bld 103 (*)    All other components within normal limits  CBC  DIFFERENTIAL  PROTIME-INR  APTT  CBG MONITORING, ED   ____________________________________________  EKG   ____________________________________________  RADIOLOGY I personally viewed and evaluated these images as part of my medical decision making, as well as reviewing the written report by the radiologist.  Ct Head Wo Contrast  Result Date: 11/05/2019 CLINICAL DATA:  Focal neuro deficit, greater than 6 hours, stroke suspected. Additional history provided: Patient reports right hand numbness and intermittent right arm numbness with difficulty walking for the past 2 days. EXAM: CT HEAD WITHOUT CONTRAST TECHNIQUE: Contiguous axial images were obtained from the base of the skull through the vertex without intravenous contrast. COMPARISON:  Report from head CT 09/10/2000 (images unavailable). FINDINGS: Brain: There is a 9 x 5 mm focus of hyperdensity along the margin of the body of the right lateral ventricle (series 2, image 16) (series 4, image 32). This does not have an appearance typical for hemorrhage but remains otherwise indeterminate. Given patient's history of seizures, a  focus of gray matter heterotopia is questioned. No demarcated cortical infarction. No midline shift or extra-axial fluid collection. Cerebral volume is normal for age. Vascular: No hyperdense vessel. Skull: Normal. Negative for fracture or focal lesion. Sinuses/Orbits: Visualized orbits demonstrate no acute abnormality. No significant paranasal sinus disease or mastoid effusion at the imaged levels. IMPRESSION: 1. No evidence of acute infarct. 2. 9 x 5 mm focus of hyperdensity along the margin of the body of the right lateral ventricle. This does not have an appearance typical for hemorrhage but remains otherwise indeterminate. Given patient's history of seizure, a focus of gray matter heterotopia is questioned. Brain MRI is recommended for further characterization. Electronically Signed   By: Kellie Simmering DO   On: 11/05/2019 12:26   Mr Brain Wo  Contrast  Result Date: 11/05/2019 CLINICAL DATA:  53 year old female with right upper extremity numbness, difficulty walking. Small area of abnormal density along the body of the right lateral ventricle on earlier CT. EXAM: MRI HEAD WITHOUT CONTRAST TECHNIQUE: Multiplanar, multiecho pulse sequences of the brain and surrounding structures were obtained without intravenous contrast. COMPARISON:  Head CT 1122 hours today. 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 And there is a second small focus of gray matter heterotopia at the atrium of that ventricle on series 16, image 90. No other migrational abnormality identified. Normal cerebral volume. No restricted diffusion to suggest acute infarction. No midline shift, mass effect, evidence of mass lesion, ventriculomegaly, extra-axial collection or acute intracranial hemorrhage. Cervicomedullary junction and pituitary are within normal limits. Aside from the right lateral ventricle migrational  abnormality there is mild for age scattered nonspecific cerebral white matter T2 and FLAIR hyperintensity. No cortical encephalomalacia. No chronic cerebral blood products identified. The deep gray matter nuclei, brainstem and cerebellum. Vascular: Major intracranial vascular flow voids are preserved. Skull and upper cervical spine: Partially visible cervical disc degeneration at C3-C4. Visualized bone marrow signal is within normal limits. Sinuses/Orbits: Negative orbits. Paranasal sinuses and mastoids remain clear. Other: Visible internal auditory structures appear normal. IMPRESSION: 1. 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 2. No superimposed acute intracranial abnormality. Mild for age nonspecific cerebral white matter signal changes. 3. Partially visible cervical disc degeneration at C3-C4. Electronically Signed   By: Genevie Ann M.D.   On: 11/05/2019 15:57    ____________________________________________    PROCEDURES  Procedure(s) performed:    Procedures    Medications - No data to display   ____________________________________________   INITIAL IMPRESSION / ASSESSMENT AND PLAN / ED COURSE  Pertinent labs & imaging results that were available during my care of the patient were reviewed by me and considered in my medical decision making (see chart for details).  Review of the [place] CSRS was performed in accordance of the [place] prior to dispensing any controlled drugs.           Patient's diagnosis is consistent with cervical and lumbar radiculopathy.  Patient presented to the emergency department complaining of numbness and tingling of her left arm and bilateral lower extremities.  Patient has a history of similar symptoms in the past "for a long period of time" patient was concerned as typically 1 area will be affected and not all 3.  Patient's initial CT did not reveal any significant evidence of  hemorrhage, however there was a hyperdense lesion adjacent to the right ventricle that was concerning.  MRI was recommended.  MRI returns with findings consistent with heterotopia without any evidence of infarction or hemorrhage.  At this time, symptoms are consistent with radiculopathy and patient will be treated with course of anti-inflammatory and muscle relaxer.  Follow-up with neurosurgery as needed for ongoing radicular symptoms..  Patient is given ED precautions to return to the ED for any worsening or new symptoms.     ____________________________________________  FINAL CLINICAL IMPRESSION(S) / ED DIAGNOSES  Final diagnoses:  Cervical radiculopathy  Lumbar radiculopathy  Heterotropia      NEW MEDICATIONS STARTED DURING THIS VISIT:  ED Discharge Orders         Ordered    meloxicam (MOBIC) 15 MG tablet  Daily     11/05/19 1633  methocarbamol (ROBAXIN) 500 MG tablet  4 times daily     11/05/19 1633              This chart was dictated using voice recognition software/Dragon. Despite best efforts to proofread, errors can occur which can change the meaning. Any change was purely unintentional.    Darletta Moll, PA-C 11/05/19 1634    Nena Polio, MD 11/05/19 1739

## 2019-11-05 NOTE — ED Triage Notes (Signed)
Pt c/o right numbness and intermittent right arm numbness with difficulty walking for the past 2 days, denies any facial numbness slurred speech or visual changes.

## 2019-11-05 NOTE — ED Notes (Signed)
PA at bedside. Stated ok to have water. Warm blanket provided as well.

## 2019-11-06 ENCOUNTER — Other Ambulatory Visit: Payer: Self-pay | Admitting: *Deleted

## 2019-11-06 ENCOUNTER — Inpatient Hospital Stay
Admission: RE | Admit: 2019-11-06 | Discharge: 2019-11-06 | Disposition: A | Discharge status: Home / Self Care | Payer: Self-pay | Source: Ambulatory Visit | Attending: *Deleted | Admitting: *Deleted

## 2019-11-06 DIAGNOSIS — Z1231 Encounter for screening mammogram for malignant neoplasm of breast: Secondary | ICD-10-CM

## 2019-11-14 DIAGNOSIS — G939 Disorder of brain, unspecified: Secondary | ICD-10-CM | POA: Diagnosis not present

## 2019-11-16 ENCOUNTER — Other Ambulatory Visit: Payer: Self-pay | Admitting: Neurosurgery

## 2019-11-16 DIAGNOSIS — G939 Disorder of brain, unspecified: Secondary | ICD-10-CM

## 2019-12-01 ENCOUNTER — Ambulatory Visit
Admission: RE | Admit: 2019-12-01 | Discharge: 2019-12-01 | Disposition: A | Discharge status: Home / Self Care | Payer: Medicare HMO | Source: Ambulatory Visit | Attending: Neurosurgery | Admitting: Neurosurgery

## 2019-12-01 ENCOUNTER — Other Ambulatory Visit: Payer: Self-pay

## 2019-12-01 DIAGNOSIS — G939 Disorder of brain, unspecified: Secondary | ICD-10-CM

## 2019-12-01 DIAGNOSIS — R269 Unspecified abnormalities of gait and mobility: Secondary | ICD-10-CM | POA: Diagnosis not present

## 2019-12-01 DIAGNOSIS — R200 Anesthesia of skin: Secondary | ICD-10-CM | POA: Diagnosis not present

## 2019-12-01 IMAGING — MR MR HEAD W/ CM
7 series · 48 of 48 positions shown · IV contrast (gadavist)
Comparison: [DATE]

CLINICAL DATA: Right upper extremity numbness. Gait disturbance.
Abnormal study previously showing gray matter heterotopia

EXAM:
MRI HEAD WITH CONTRAST
TECHNIQUE: Multiplanar, multiecho pulse sequences of the brain and surrounding
structures were obtained with intravenous contrast.
CONTRAST:  9mL GADAVIST GADOBUTROL 1 MMOL/ML IV SOLN

[Series 5: T2 · axial · 5.0mm · 0.53mm/px · z∈[-61,+83]mm · 3 of 25 slices shown]
[im 1/25]
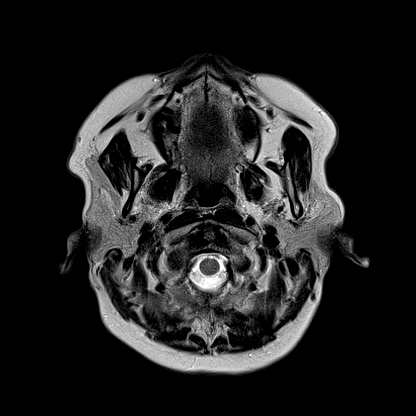
[im 13/25]
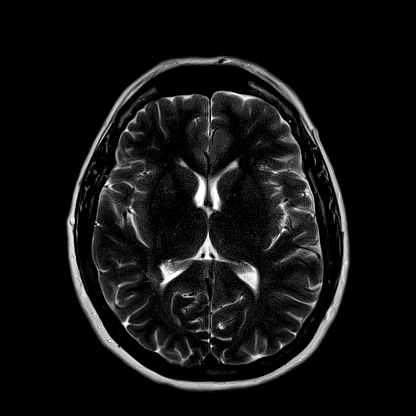
[im 25/25]
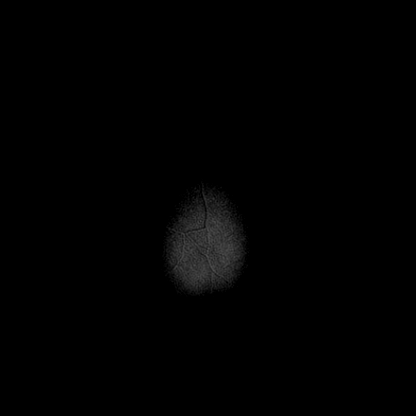

[Series 6: FLAIR · axial · 3.0mm · 0.53mm/px · z∈[-70,+92]mm · 5 of 55 slices shown]
[im 1/55]
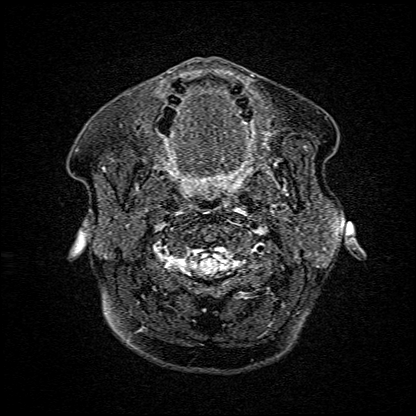
[im 14/55]
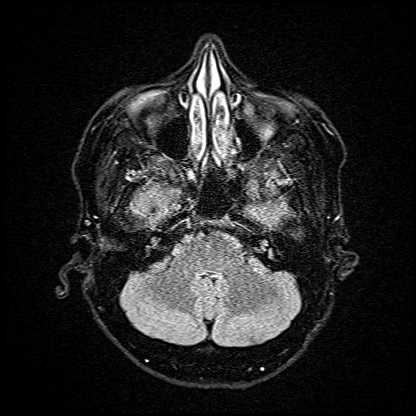
[im 28/55]
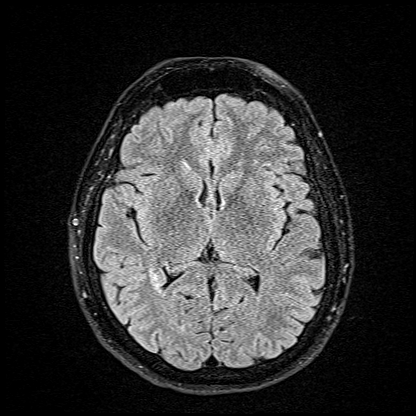
[im 41/55]
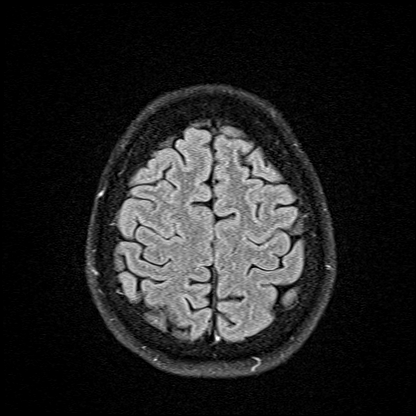
[im 55/55]
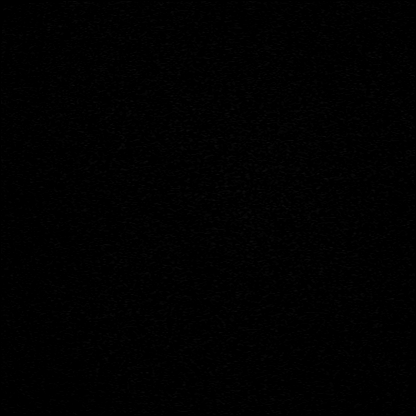

[Series 7: T1 · axial · 1.0mm · 0.98mm/px · z∈[-77,+98]mm · 16 of 175 slices shown]
[im 1/175]
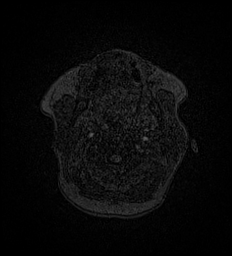
[im 12/175]
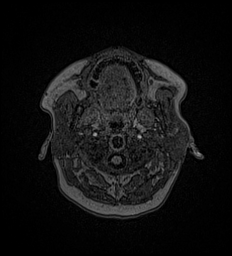
[im 24/175]
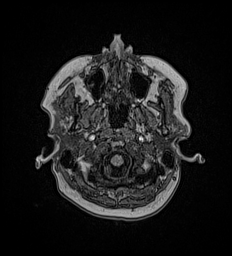
[im 35/175]
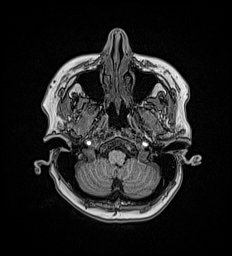
[im 47/175]
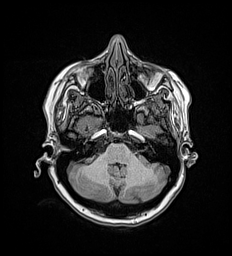
[im 59/175]
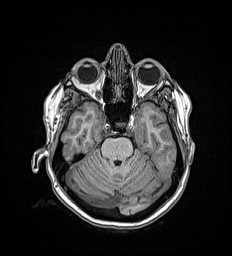
[im 70/175]
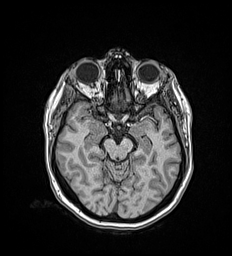
[im 82/175]
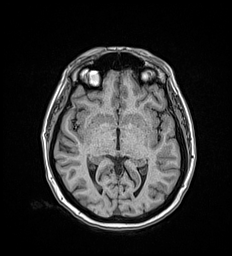
[im 93/175]
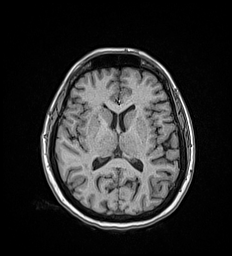
[im 105/175]
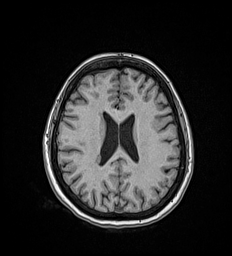
[im 117/175]
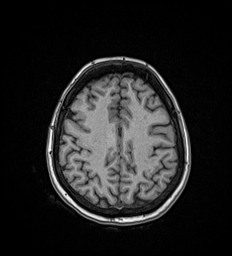
[im 128/175]
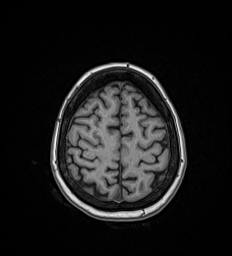
[im 140/175]
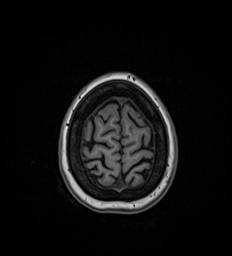
[im 151/175]
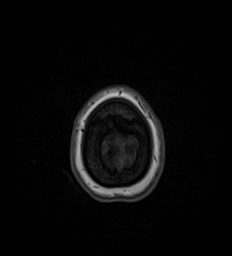
[im 163/175]
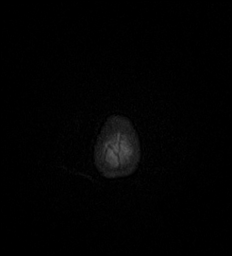
[im 175/175]
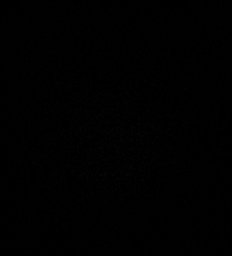

[Series 8: T2 post-contrast · coronal · 5.0mm · 0.57mm/px · 3 of 29 slices shown]
[im 1/29]
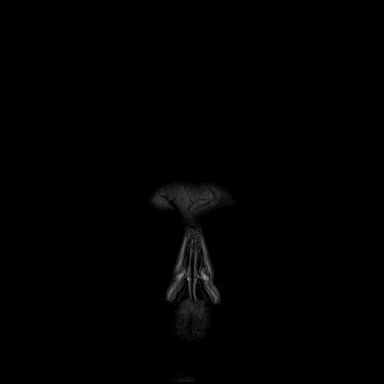
[im 15/29]
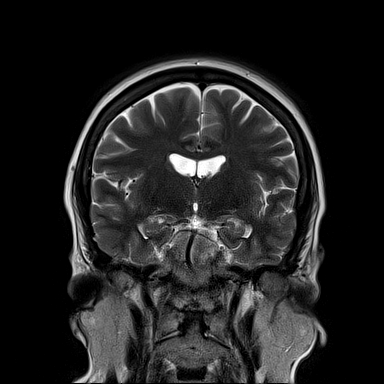
[im 29/29]
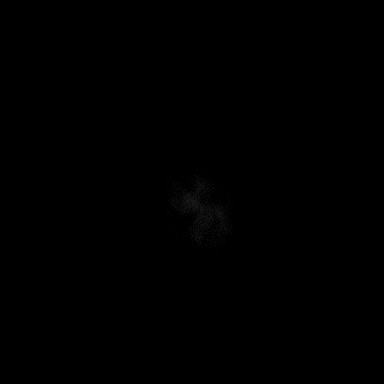

[Series 9: T1 post-contrast · axial · 1.0mm · 0.98mm/px · z∈[-77,+98]mm · 16 of 176 slices shown (1 of 3)]
[im 1/176]
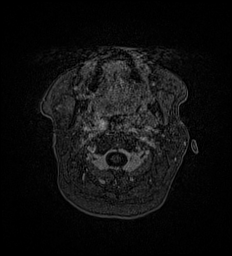
[im 12/176]
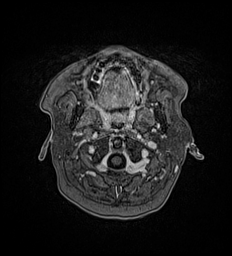
[im 24/176]
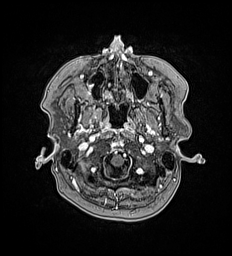
[im 36/176]
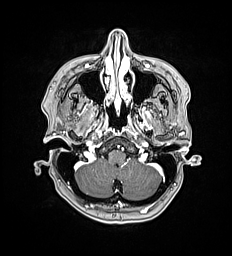
[im 47/176]
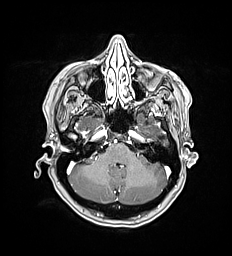
[im 59/176]
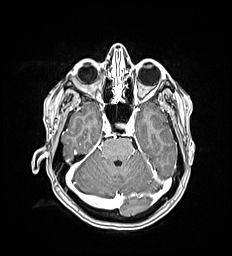
[im 71/176]
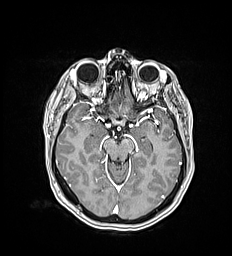
[im 82/176]
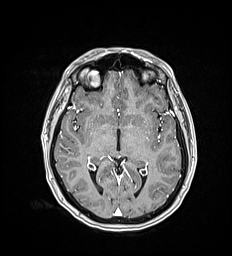
[im 94/176]
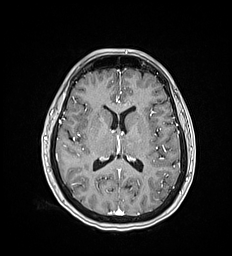
[im 106/176]
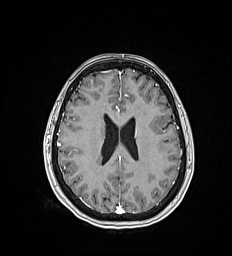
[im 117/176]
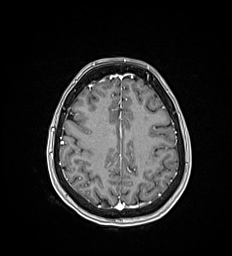
[im 129/176]
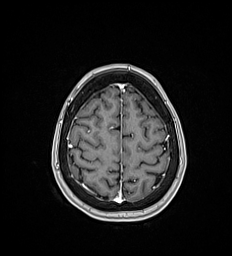
[im 141/176]
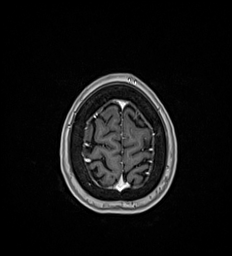
[im 152/176]
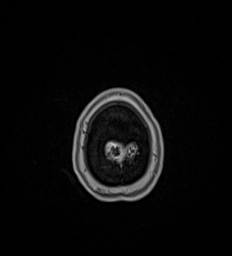
[im 164/176]
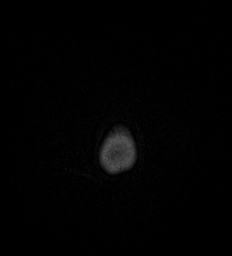
[im 176/176]
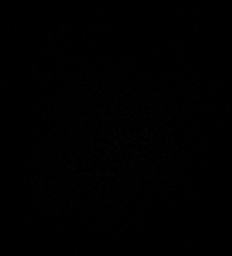

[Series 10: T1 post-contrast · coronal · 5.0mm · 0.57mm/px · 3 of 29 slices shown (2 of 3)]
[im 1/29]
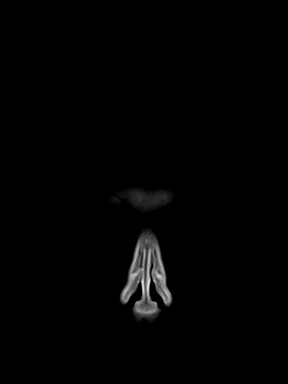
[im 15/29]
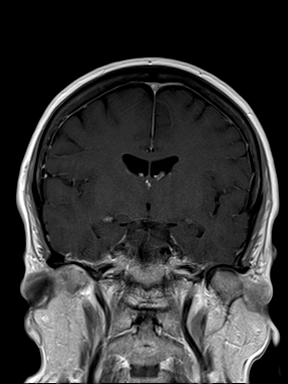
[im 29/29]
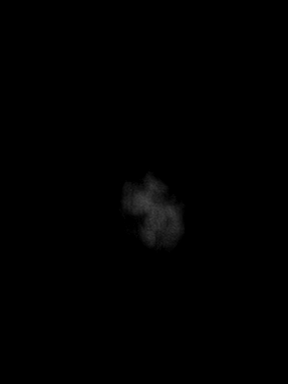

[Series 11: T1 post-contrast · sagittal · 5.0mm · 0.62mm/px · 2 of 25 slices shown (3 of 3)]
[im 1/25]
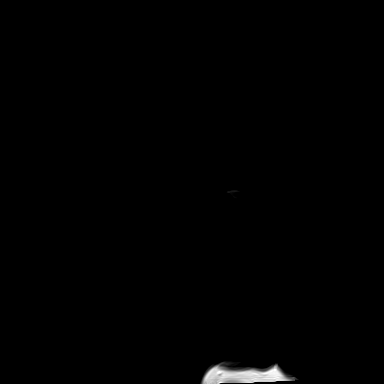
[im 25/25]
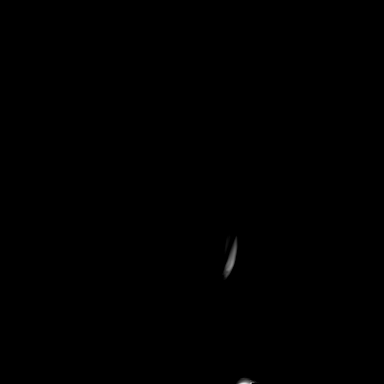

[48 of 48 positions shown; findings below may reference images not displayed]

FINDINGS: Brain: Redemonstrated are foci of heterotopic gray matter along the
lateral margin of the lateral ventricle on the right. No evidence of
edema or abnormal contrast enhancement to suggest alternative
diagnoses such as neoplastic mass lesions. No abnormal enhancement
elsewhere. Scattered foci of T2 and FLAIR signal within the
hemispheric white matter are stable, most notable in the forceps
major region on the right.

Vascular: Major vessels at the base of the brain show flow.

Skull and upper cervical spine: Negative

Sinuses/Orbits: Clear/normal

Other: None
IMPRESSION: Postcontrast imaging does not show any abnormal enhancement. Gray
matter heterotopia along the lateral margin of the right lateral
ventricle as seen previously. No sign of mass lesion or abnormal
enhancement.

Scattered foci of T2 and FLAIR signal in the hemispheric white
matter as seen previously, most notable in the forceps major region
on the right. No sign of acute or subacute insult.

## 2019-12-01 MED ORDER — GADOBUTROL 1 MMOL/ML IV SOLN
9.0000 mL | Freq: Once | INTRAVENOUS | Status: AC | PRN
  Administered 2019-12-01: 9 mL via INTRAVENOUS

## 2019-12-18 IMAGING — CR RIGHT WRIST - COMPLETE 3+ VIEW
1 series · 4 of 4 positions shown · non-contrast
Comparison: None.

CLINICAL DATA: Right wrist pain since a fall down stairs yesterday.
Initial encounter.

EXAM:
RIGHT WRIST - COMPLETE 3+ VIEW

[Series 1: dg wrist complete right · 0.14mm/px · 4 of 4 slices shown]
[im 1/4]
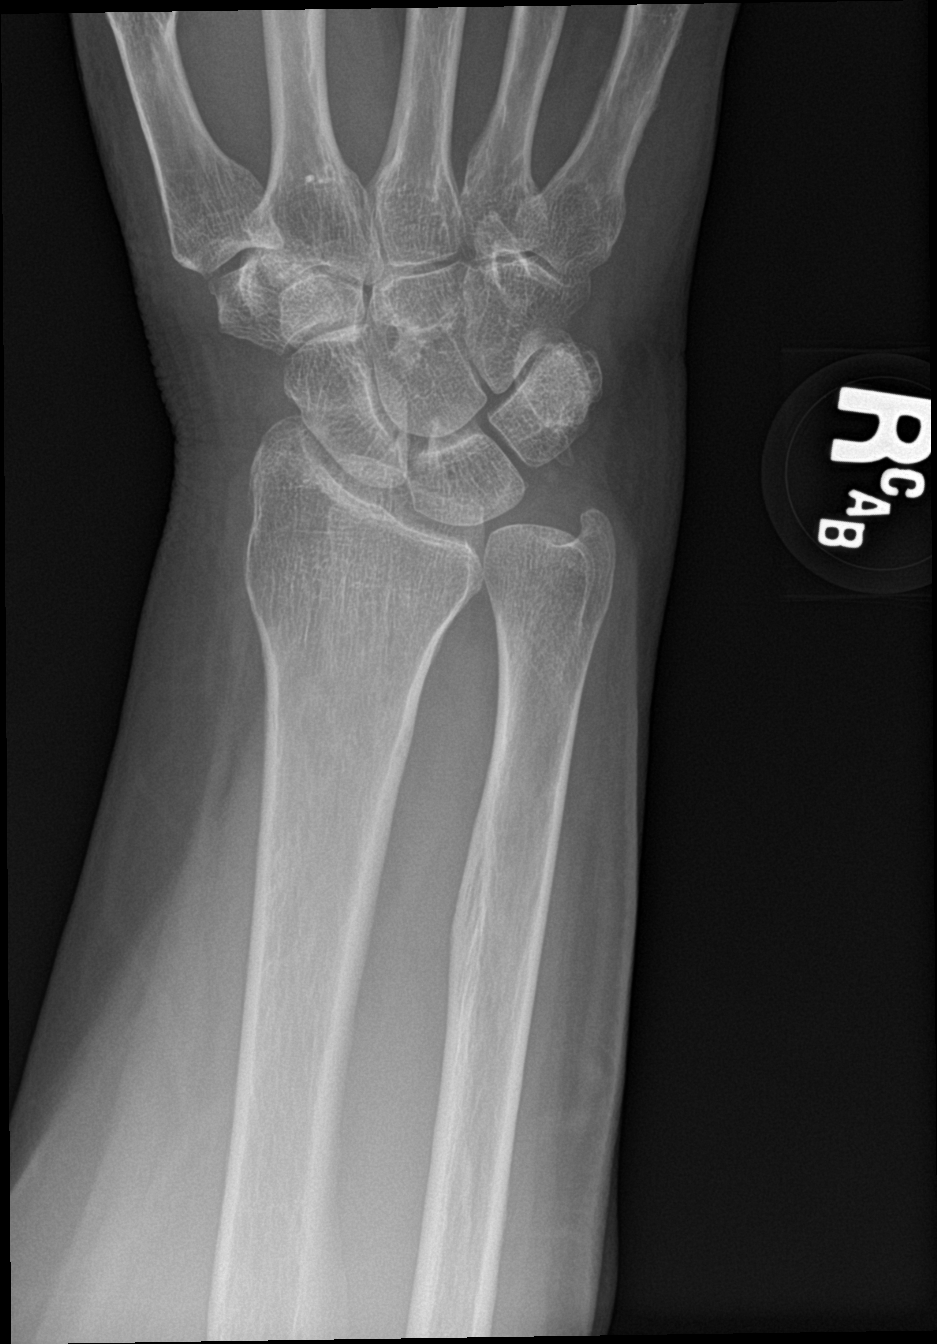
[im 2/4]
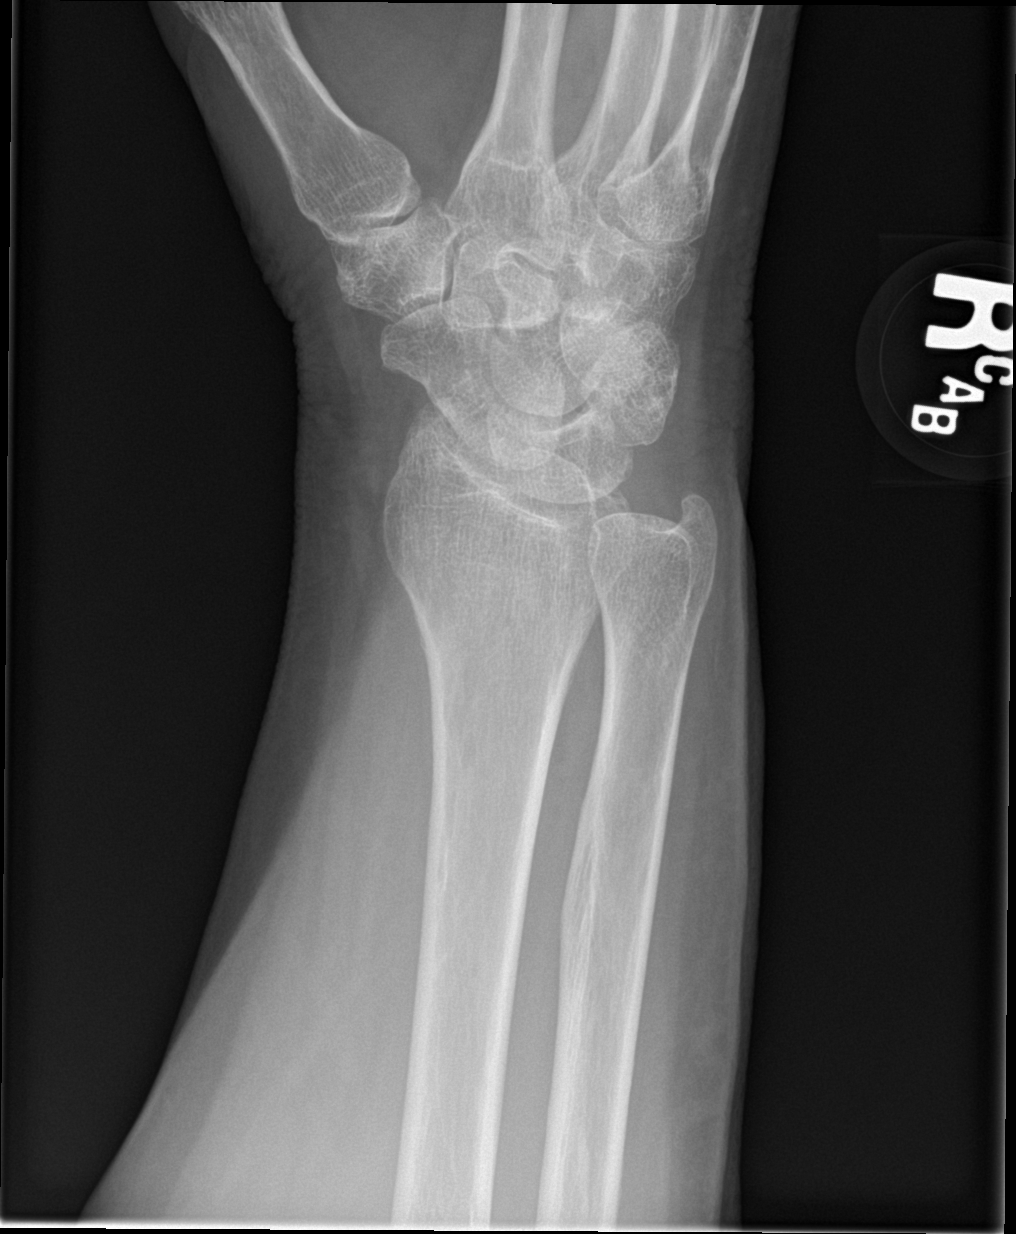
[im 3/4]
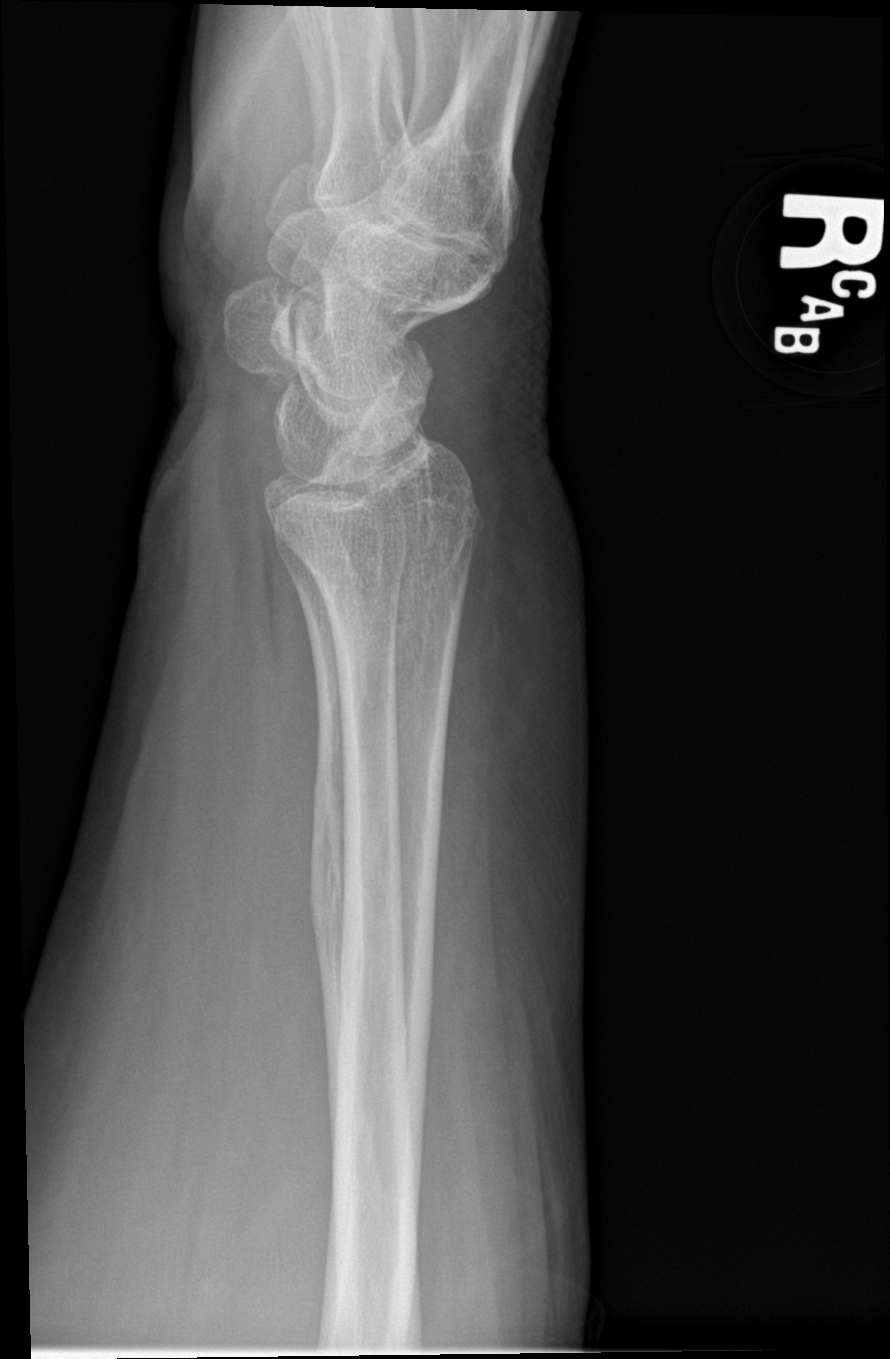
[im 4/4]
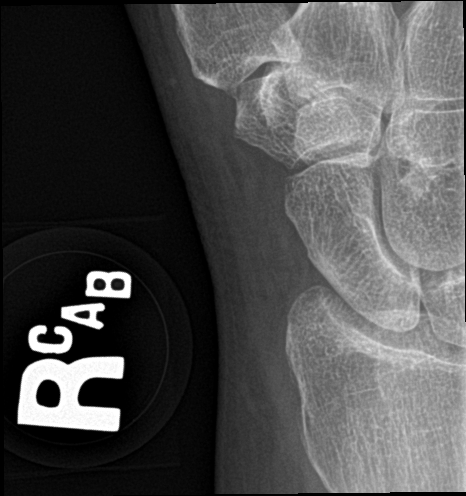

[4 of 4 positions shown; findings below may reference images not displayed]

FINDINGS: There is no evidence of fracture or dislocation. There is no
evidence of arthropathy or other focal bone abnormality. Soft
tissues are unremarkable.
IMPRESSION: Negative exam.

## 2020-01-04 DIAGNOSIS — I6350 Cerebral infarction due to unspecified occlusion or stenosis of unspecified cerebral artery: Secondary | ICD-10-CM | POA: Diagnosis not present

## 2020-01-04 DIAGNOSIS — R32 Unspecified urinary incontinence: Secondary | ICD-10-CM | POA: Diagnosis not present

## 2020-01-04 DIAGNOSIS — N814 Uterovaginal prolapse, unspecified: Secondary | ICD-10-CM | POA: Diagnosis not present

## 2020-01-04 DIAGNOSIS — R911 Solitary pulmonary nodule: Secondary | ICD-10-CM | POA: Diagnosis not present

## 2020-02-29 DIAGNOSIS — I10 Essential (primary) hypertension: Secondary | ICD-10-CM | POA: Diagnosis not present

## 2020-02-29 DIAGNOSIS — M797 Fibromyalgia: Secondary | ICD-10-CM | POA: Diagnosis not present

## 2020-02-29 DIAGNOSIS — G4733 Obstructive sleep apnea (adult) (pediatric): Secondary | ICD-10-CM | POA: Diagnosis not present

## 2020-02-29 DIAGNOSIS — E034 Atrophy of thyroid (acquired): Secondary | ICD-10-CM | POA: Diagnosis not present

## 2020-02-29 DIAGNOSIS — R911 Solitary pulmonary nodule: Secondary | ICD-10-CM | POA: Diagnosis not present

## 2020-02-29 DIAGNOSIS — E7849 Other hyperlipidemia: Secondary | ICD-10-CM | POA: Diagnosis not present

## 2020-02-29 DIAGNOSIS — R5381 Other malaise: Secondary | ICD-10-CM | POA: Diagnosis not present

## 2020-03-05 DIAGNOSIS — R911 Solitary pulmonary nodule: Secondary | ICD-10-CM | POA: Diagnosis not present

## 2020-03-05 DIAGNOSIS — M797 Fibromyalgia: Secondary | ICD-10-CM | POA: Diagnosis not present

## 2020-03-05 DIAGNOSIS — G4733 Obstructive sleep apnea (adult) (pediatric): Secondary | ICD-10-CM | POA: Diagnosis not present

## 2020-03-19 ENCOUNTER — Emergency Department: Payer: Medicare Other

## 2020-03-19 ENCOUNTER — Emergency Department
Admission: EM | Admit: 2020-03-19 | Discharge: 2020-03-19 | Disposition: A | Discharge status: Home / Self Care | Payer: Medicare Other | Attending: Student | Admitting: Student

## 2020-03-19 ENCOUNTER — Other Ambulatory Visit: Payer: Self-pay

## 2020-03-19 DIAGNOSIS — Z79899 Other long term (current) drug therapy: Secondary | ICD-10-CM | POA: Diagnosis not present

## 2020-03-19 DIAGNOSIS — G4733 Obstructive sleep apnea (adult) (pediatric): Secondary | ICD-10-CM | POA: Diagnosis not present

## 2020-03-19 DIAGNOSIS — R911 Solitary pulmonary nodule: Secondary | ICD-10-CM | POA: Diagnosis not present

## 2020-03-19 DIAGNOSIS — G8929 Other chronic pain: Secondary | ICD-10-CM | POA: Insufficient documentation

## 2020-03-19 DIAGNOSIS — M5442 Lumbago with sciatica, left side: Secondary | ICD-10-CM | POA: Diagnosis not present

## 2020-03-19 DIAGNOSIS — R200 Anesthesia of skin: Secondary | ICD-10-CM | POA: Insufficient documentation

## 2020-03-19 DIAGNOSIS — M797 Fibromyalgia: Secondary | ICD-10-CM | POA: Diagnosis not present

## 2020-03-19 DIAGNOSIS — M545 Low back pain: Secondary | ICD-10-CM | POA: Diagnosis present

## 2020-03-19 DIAGNOSIS — R202 Paresthesia of skin: Secondary | ICD-10-CM | POA: Diagnosis not present

## 2020-03-19 DIAGNOSIS — M48061 Spinal stenosis, lumbar region without neurogenic claudication: Secondary | ICD-10-CM | POA: Diagnosis not present

## 2020-03-19 DIAGNOSIS — F17210 Nicotine dependence, cigarettes, uncomplicated: Secondary | ICD-10-CM | POA: Insufficient documentation

## 2020-03-19 IMAGING — CT CT L SPINE W/O CM
3 series · 10 of 33 positions shown, 11 images · non-contrast
Comparison: CT abdomen [DATE]

CLINICAL DATA: Back and bilateral leg pain. No known injury.

EXAM:
CT LUMBAR SPINE WITHOUT CONTRAST
TECHNIQUE: Multidetector CT imaging of the lumbar spine was performed without
intravenous contrast administration. Multiplanar CT image
reconstructions were also generated.

[Series 4: l spine soft · axial · 0.34mm/px · z∈[-368,-268]mm · 2 of 110 slices shown, 3 images]
[im 34/110  soft-tissue]
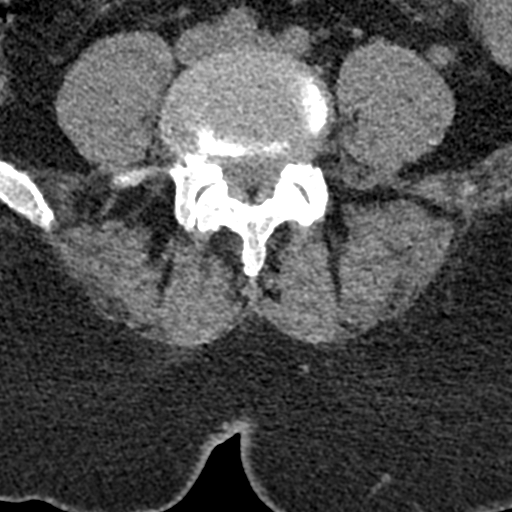
[im 34/110  bone]
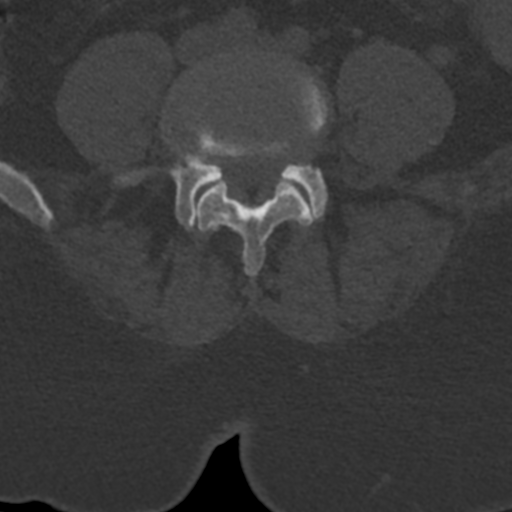
[im 84/110  bone]
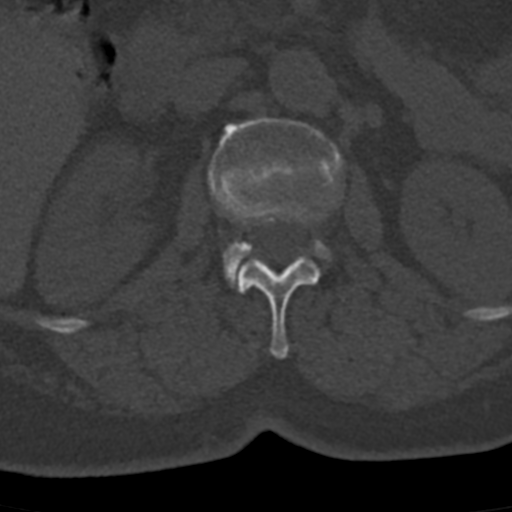

[Series 7: sagittal bone · sagittal · 0.23mm/px · 5 of 50 slices shown]
[im 17/50  bone]
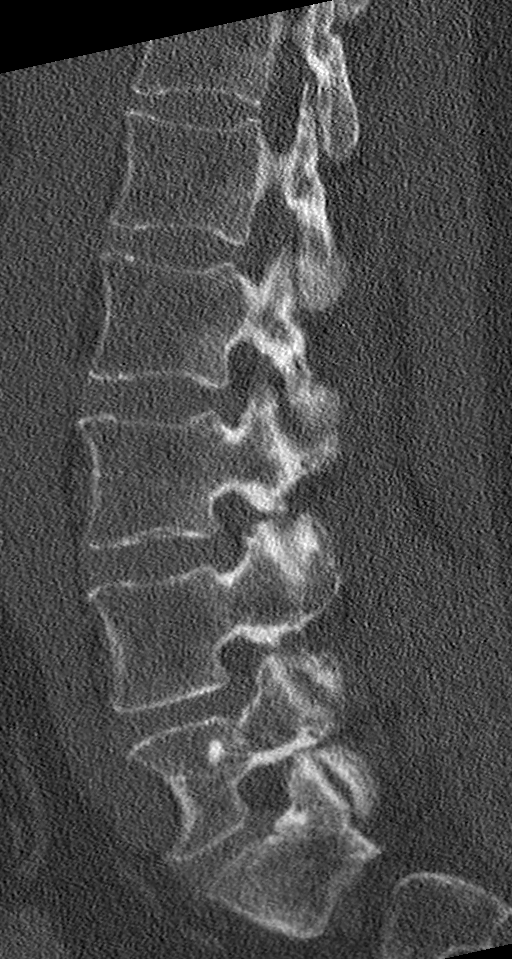
[im 21/50  bone]
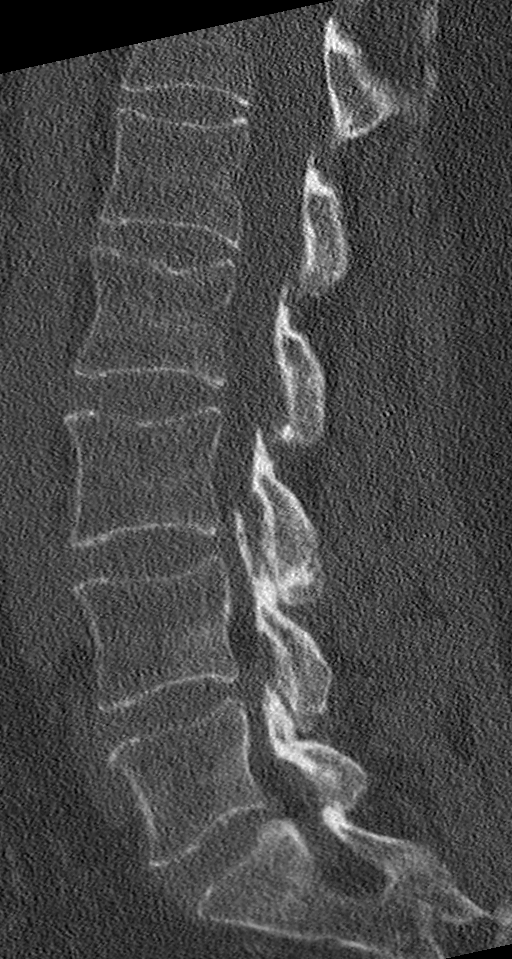
[im 25/50  bone]
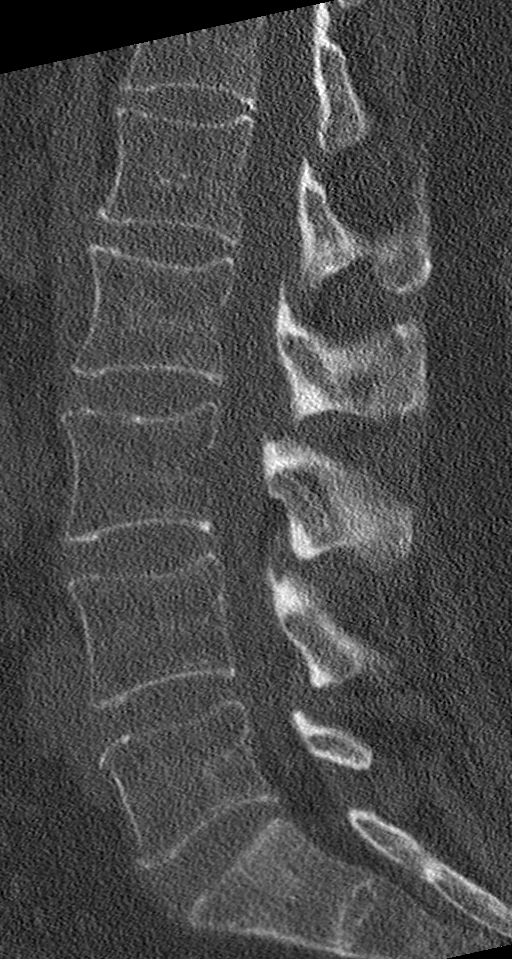
[im 29/50  bone]
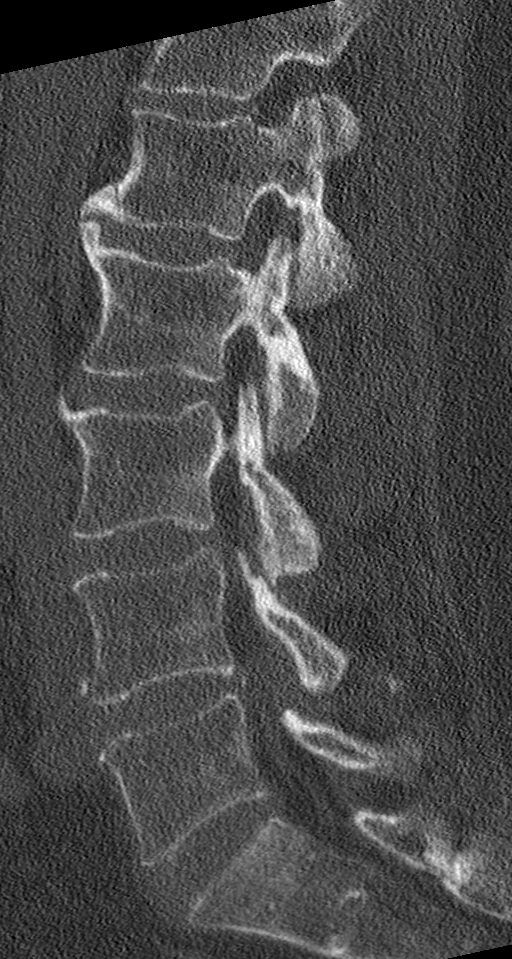
[im 33/50  bone]
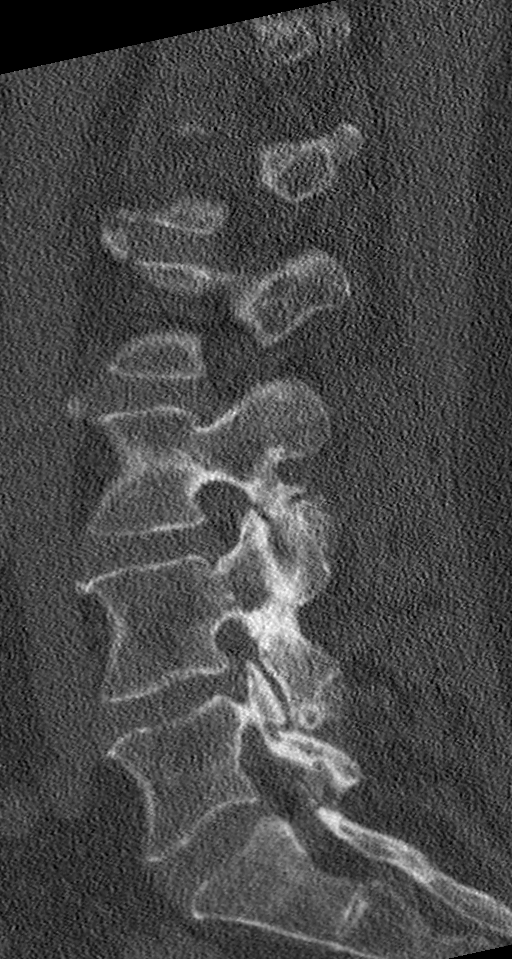

[Series 8: coronal bone · coronal · 0.20mm/px · 3 of 60 slices shown]
[im 12/60  bone]
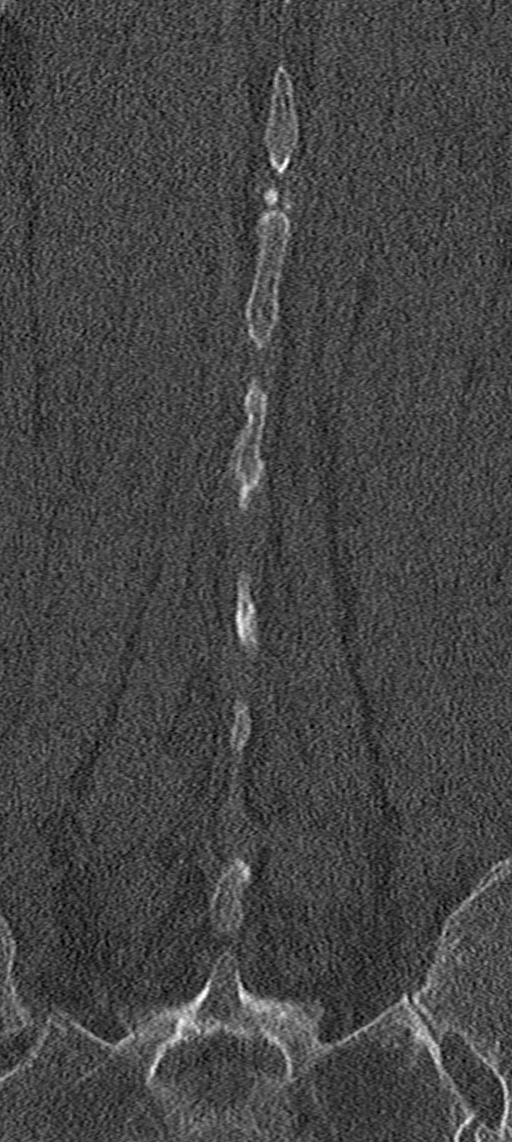
[im 24/60  bone]
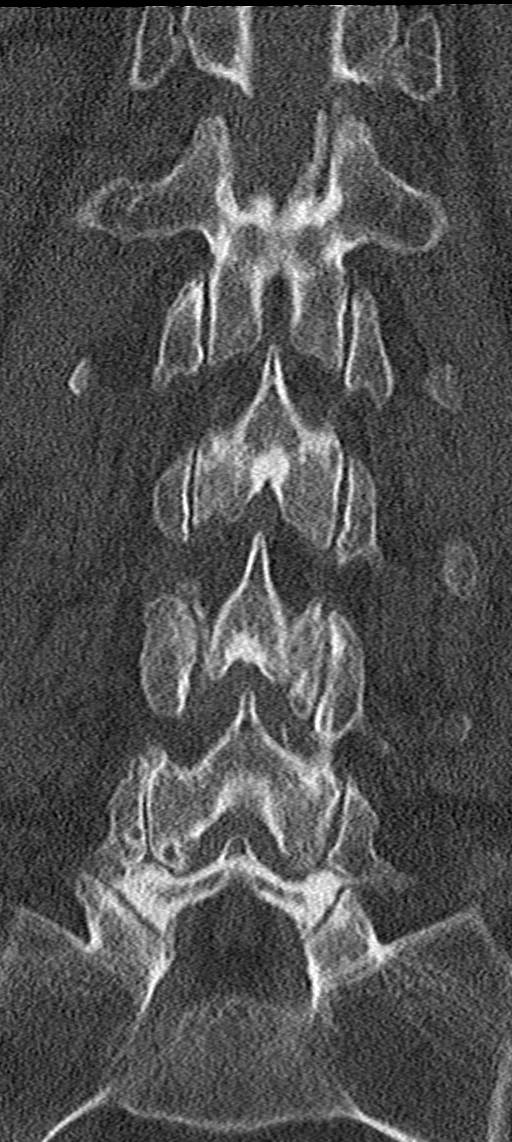
[im 36/60  bone]
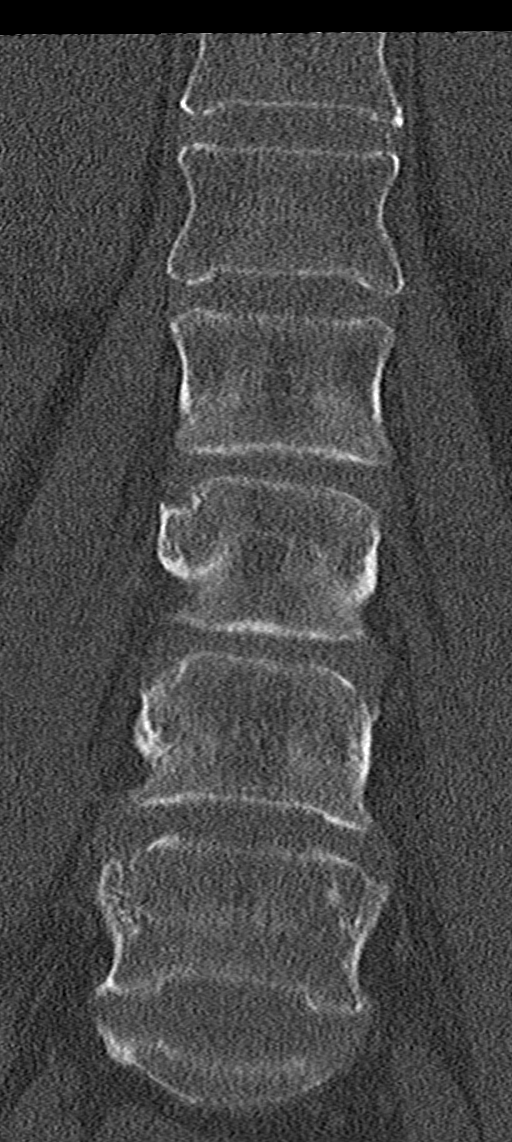

[10 of 33 positions shown; findings below may reference images not displayed]

FINDINGS: Segmentation: There are five lumbar type vertebral bodies. The last
full intervertebral disc space is labeled L5-S1.

Alignment: Normal

Vertebrae: Intact. No bone lesions or fractures. The facets are
normally aligned. No pars defects. Moderate facet disease.

Paraspinal and other soft tissues: No significant paraspinal or
retroperitoneal findings.

Disc levels: L1-2: No significant findings. Mild facet disease.

L2-3: Mild to moderate facet disease but no disc protrusions, spinal
or foraminal stenosis.

L3-4: Bulging degenerated annulus, short pedicles and advanced facet
disease with thickening and calcification of the ligamentum flavum
contributing to moderate to moderately severe spinal and bilateral
lateral recess stenosis. No significant foraminal stenosis.

L4-5: Diffuse bulging annulus, moderate facet disease and ligamentum
flavum thickening contributing to mild to moderate spinal and
bilateral lateral recess stenosis. There is also an extraforaminal
disc protrusion on the right contacting and slightly displacing the
right L4 nerve root.

L5-S1: Moderate facet disease but no disc protrusions, spinal or
foraminal stenosis.
IMPRESSION: 1. Degenerative lumbar spondylosis with multilevel disc disease and
facet disease.
2. Moderate to moderately severe spinal and bilateral lateral recess
stenosis at L3-4.
3. Mild to moderate spinal and bilateral lateral recess stenosis at
L4-5.
4. Extraforaminal disc protrusion on the right at L4-5 contacting
and slightly displacing the right L4 nerve root.

## 2020-03-19 MED ORDER — HYDROCODONE-ACETAMINOPHEN 5-325 MG PO TABS: 1.0000 | ORAL_TABLET | ORAL | 0 refills | Status: DC | PRN

## 2020-03-19 MED ORDER — PREDNISONE 20 MG PO TABS
60.0000 mg | ORAL_TABLET | Freq: Once | ORAL | Status: AC
  Administered 2020-03-19: 60 mg via ORAL
  Filled 2020-03-19: qty 3

## 2020-03-19 MED ORDER — PREDNISONE 10 MG PO TABS: 10.0000 mg | ORAL_TABLET | Freq: Every day | ORAL | 0 refills | Status: DC

## 2020-03-19 MED ORDER — MELOXICAM 7.5 MG PO TABS
15.0000 mg | ORAL_TABLET | Freq: Once | ORAL | Status: AC
  Administered 2020-03-19: 18:00:00 15 mg via ORAL
  Filled 2020-03-19 (×2): qty 2

## 2020-03-19 MED ORDER — MELOXICAM 15 MG PO TABS: 15.0000 mg | ORAL_TABLET | Freq: Every day | ORAL | 0 refills | Status: DC

## 2020-03-19 MED ORDER — METHOCARBAMOL 500 MG PO TABS: 500.0000 mg | ORAL_TABLET | Freq: Four times a day (QID) | ORAL | 0 refills | Status: DC

## 2020-03-19 NOTE — ED Notes (Signed)
See triage note  Presents with left leg pain for sometime  States she was sent by PCP for evaluation of pain  States pain is getting worse  Denies any injury  Pain radiates fro hip into leg  Unable to bear wt

## 2020-03-19 NOTE — ED Triage Notes (Signed)
Pt comes via POV from home with c/o bilateral leg pain. Pt states it feels like needles in her legs. Pt denies any recent injury.

## 2020-03-19 NOTE — ED Provider Notes (Signed)
[facility] Emergency Department Provider Note  ____________________________________________  Time seen: Approximately 3:25 PM  I have reviewed the triage vital signs and the nursing notes.   HISTORY  Chief Complaint Leg Pain    HPI Kristen Hudson is a 54 y.o. female who presents to the emergency department complaining of increasing lower back pain.  Patient has a history of low back pain, has been seen by her primary care for same.  Patient is undergoing work-up at this time to determine whether she has osteoarthritis versus rheumatoid arthritis.   Patient states that over the last 2 weeks she has been having increasing lower back pain.  Patient states that the pain is now keeping her from ambulating appropriately.  She is having numbness and tingling of the left lower extremity but not on the right.  No loss of sensation.  No bowel or bladder dysfunction, saddle anesthesi.  No direct trauma.  Patient denies any urinary or GI complaints.  No fevers or chills.        Past Medical History:  Diagnosis Date  . Acid reflux   . Seizures [facility])     Patient Active Problem List   Diagnosis Date Noted  . Chronic pelvic pain in female 08/24/2018  . Status post bilateral salpingo-oophorectomy (BSO) 08/24/2018  . History of 3 cesarean sections 08/24/2018  . Vasomotor symptoms due to menopause 08/24/2018  . BMI 33.0-33.9,adult 08/24/2018  . Urinary urgency 08/24/2018  . Urinary frequency 08/24/2018    Past Surgical History:  Procedure Laterality Date  . APPENDECTOMY    . CESAREAN SECTION    . CHOLECYSTECTOMY    . COLON RESECTION    . OVARY SURGERY      Prior to Admission medications   Medication Sig Start Date End Date Taking? Authorizing Provider  cyclobenzaprine (FLEXERIL) 5 MG tablet TAKE 1 TO 2 TABLETS BY MOUTH THREE TIMES DAILY AS NEEDED FOR MUSCLE SPASM 05/04/19   [provider]  diazepam (VALIUM) 5 MG tablet Take 1 tablet (5 mg total) by mouth  every 6 (six) hours as needed (vaginal valium). Patient taking differently: Take 5 mg by mouth every 6 (six) hours as needed.  12/13/18   Philip Aspen, CNM  gabapentin (NEURONTIN) 300 MG capsule 300mg  qHS x 7 d, then 600mg  qHS x 7 d, then 300mg  qAM and 600mg  qHS x 7 d, then 300mg  qAM, 300mg  q-afternoon and 600mg  qHS 05/04/19   [provider]  HYDROcodone-acetaminophen (NORCO/VICODIN) 5-325 MG tablet Take 1 tablet by mouth every 4 (four) hours as needed for moderate pain. 03/19/20   Tawanda Schall, Charline Bills, PA-C  levETIRAcetam (KEPPRA) 500 MG tablet Take 500 mg by mouth 2 (two) times daily.  12/16/17   [provider]  meloxicam (MOBIC) 15 MG tablet Take 1 tablet (15 mg total) by mouth daily. 03/19/20   Elah Avellino, Charline Bills, PA-C  methocarbamol (ROBAXIN) 500 MG tablet Take 1 tablet (500 mg total) by mouth 4 (four) times daily. 03/19/20   Doyce Stonehouse, Charline Bills, PA-C  phenazopyridine (PYRIDIUM) 200 MG tablet Take 1 tablet (200 mg total) by mouth 3 (three) times daily as needed for pain. 09/22/19 09/21/20  Earleen Newport, MD  phenytoin (DILANTIN) 100 MG ER capsule Take 200 mg by mouth 2 (two) times daily. 06/03/18   [provider]  phenytoin (DILANTIN) 125 MG/5ML suspension Take 125 mg by mouth 2 (two) times daily.     [provider]  predniSONE (DELTASONE) 10 MG tablet Take 1 tablet (10  mg total) by mouth daily. 03/19/20   Batina Dougan, Charline Bills, PA-C  sulfamethoxazole-trimethoprim (BACTRIM DS) 800-160 MG tablet Take 1 tablet by mouth 2 (two) times daily. 09/22/19   Earleen Newport, MD  traZODone (DESYREL) 50 MG tablet Take 50 mg by mouth daily. 08/06/19   [provider]    Allergies Penicillins  Family History  Problem Relation Age of Onset  . Breast cancer Mother 58  . Colon cancer Father   . Diabetes Father   . Ovarian cancer Neg Hx     Social History Social History   Tobacco Use  . Smoking status: Current Every Day Smoker    Packs/day:  0.25    Types: Cigarettes  . Smokeless tobacco: Never Used  Substance Use Topics  . Alcohol use: Not Currently  . Drug use: Not Currently     Review of Systems  Constitutional: No fever/chills Eyes: No visual changes. No discharge ENT: No upper respiratory complaints. Cardiovascular: no chest pain. Respiratory: no cough. No SOB. Gastrointestinal: No abdominal pain.  No nausea, no vomiting.  No diarrhea.  No constipation. Genitourinary: Negative for dysuria. No hematuria Musculoskeletal: Positive for low back pain affecting walking.  Numbness and tingling of the left lower extremity. Skin: Negative for rash, abrasions, lacerations, ecchymosis. Neurological: Negative for headaches, focal weakness or numbness. 10-point ROS otherwise negative.  ____________________________________________   PHYSICAL EXAM:  VITAL SIGNS: ED Triage Vitals  Enc Vitals Group     BP 03/19/20 1445 115/78     Pulse Rate 03/19/20 1445 84     Resp 03/19/20 1445 18     Temp 03/19/20 1445 98.3 F (36.8 C)     Temp src --      SpO2 03/19/20 1445 96 %     Weight 03/19/20 1444 219 lb (99.3 kg)     Height 03/19/20 1444 5\' 4"  (1.626 m)     Head Circumference --      Peak Flow --      Pain Score 03/19/20 1444 10     Pain Loc --      Pain Edu? --      Excl. in [place]? --      Constitutional: Alert and oriented. Well appearing and in no acute distress. Eyes: Conjunctivae are normal. PERRL. EOMI. Head: Atraumatic. ENT:      Ears:       Nose: No congestion/rhinnorhea.      Mouth/Throat: Mucous membranes are moist.  Neck: No stridor.    Cardiovascular: Normal rate, regular rhythm. Normal S1 and S2.  Good peripheral circulation. Respiratory: Normal respiratory effort without tachypnea or retractions. Lungs CTAB. Good air entry to the bases with no decreased or absent breath sounds. Gastrointestinal: Bowel sounds 4 quadrants. Soft and nontender to palpation. No guarding or rigidity. No palpable masses. No  distention. No CVA tenderness. Musculoskeletal: Full range of motion to all extremities. No gross deformities appreciated.  Visualization of the lower back reveals no visible signs of trauma.  No ecchymosis, abrasions.  Patient is diffusely tender to palpation throughout the lumbar spine, bilateral paraspinal muscles extending into the SI joints.  No palpable abnormality or step-off.  Patient is tender to palpation over bilateral sciatic notches, worse on the left than right.  Negative straight leg raise. Dorsalis pedis pulse and sensation intact bilaterally Neurologic:  Normal speech and language. No gross focal neurologic deficits are appreciated.  Skin:  Skin is warm, dry and intact. No rash noted. Psychiatric: Mood and affect are normal. Speech  and behavior are normal. Patient exhibits appropriate insight and judgement.   ____________________________________________   LABS (all labs ordered are listed, but only abnormal results are displayed)  Labs Reviewed - No data to display ____________________________________________  EKG   ____________________________________________  RADIOLOGY I personally viewed and evaluated these images as part of my medical decision making, as well as reviewing the written report by the radiologist.  CT Lumbar Spine Wo Contrast  Result Date: 03/19/2020 CLINICAL DATA:  Back and bilateral leg pain. No known injury. EXAM: CT LUMBAR SPINE WITHOUT CONTRAST TECHNIQUE: Multidetector CT imaging of the lumbar spine was performed without intravenous contrast administration. Multiplanar CT image reconstructions were also generated. COMPARISON:  CT abdomen 06/02/2019 FINDINGS: Segmentation: There are five lumbar type vertebral bodies. The last full intervertebral disc space is labeled L5-S1. Alignment: Normal Vertebrae: Intact. No bone lesions or fractures. The facets are normally aligned. No pars defects. Moderate facet disease. Paraspinal and other soft tissues: No  significant paraspinal or retroperitoneal findings. Disc levels: L1-2: No significant findings. Mild facet disease. L2-3: Mild to moderate facet disease but no disc protrusions, spinal or foraminal stenosis. L3-4: Bulging degenerated annulus, short pedicles and advanced facet disease with thickening and calcification of the ligamentum flavum contributing to moderate to moderately severe spinal and bilateral lateral recess stenosis. No significant foraminal stenosis. L4-5: Diffuse bulging annulus, moderate facet disease and ligamentum flavum thickening contributing to mild to moderate spinal and bilateral lateral recess stenosis. There is also an extraforaminal disc protrusion on the right contacting and slightly displacing the right L4 nerve root. L5-S1: Moderate facet disease but no disc protrusions, spinal or foraminal stenosis. IMPRESSION: 1. Degenerative lumbar spondylosis with multilevel disc disease and facet disease. 2. Moderate to moderately severe spinal and bilateral lateral recess stenosis at L3-4. 3. Mild to moderate spinal and bilateral lateral recess stenosis at L4-5. 4. Extraforaminal disc protrusion on the right at L4-5 contacting and slightly displacing the right L4 nerve root. Electronically Signed   By: Marijo Sanes M.D.   On: 03/19/2020 16:22    ____________________________________________    PROCEDURES  Procedure(s) performed:    Procedures    Medications  meloxicam (MOBIC) tablet 15 mg (15 mg Oral Given 03/19/20 1804)  predniSONE (DELTASONE) tablet 60 mg (60 mg Oral Given 03/19/20 1804)     ____________________________________________   INITIAL IMPRESSION / ASSESSMENT AND PLAN / ED COURSE  Pertinent labs & imaging results that were available during my care of the patient were reviewed by me and considered in my medical decision making (see chart for details).  Review of the [place] CSRS was performed in accordance of the [place] prior to dispensing any controlled  drugs.  Clinical Course as of Mar 19 1808  Tue Mar 19, 2020  1728 Patient presented to emergency department complaining of sharp back pain with numbness and tingling in the left thigh region.  Patient has been evaluated by primary care and is undergoing work-up to determine whether patient has osteoarthritis versus rheumatoid arthritis.  Patient is still ambulatory but her primary complaint was pain with ambulation.  Exam was reassuring with no neuro deficits.  Imaging reveals multilevel changes with bulging disc, stenosis.  At this time, again no concerning findings of saddle anesthesia, paresthesias, bowel or bladder dysfunction.  Patient is still amatory just complaining of pain.  At this time patient is stable for discharge and I will put the patient on anti-inflammatory, steroid, muscle relaxer, pain medication.  Follow-up with neurosurgery.   [JC]  Clinical Course User Index [JC] Majesti Gambrell, Charline Bills, PA-C          Patient's diagnosis is consistent with chronic mid low back pain with left-sided sciatica.  Patient presented to emergency department with acute on chronic back pain.  Patient is ambulatory but the primary complaint was pain was limiting her ambulation.  Patient was neurologically intact on exam.  CT scan reveals multiple levels changes.  These findings are definitely consistent with patient's pain.  There is no concern for cauda equina at this time.  Patient is currently being followed by neurology, primary care for both this complaint as well as history of seizures.  Patient denied any seizure-like activity.  Patient has seen neurosurgery recently for a brain mass that was stable, did not need further follow-up.  I referred the patient back to neurosurgery for management of worsening lower back pain.  Return precautions are discussed at length with the patient.. Patient will be discharged home with prescriptions for prednisone, meloxicam, Robaxin, Norco. Patient is to follow up  with primary care and neurosurgery as needed or otherwise directed. Patient is given ED precautions to return to the ED for any worsening or new symptoms.     ____________________________________________  FINAL CLINICAL IMPRESSION(S) / ED DIAGNOSES  Final diagnoses:  Chronic midline low back pain with left-sided sciatica      NEW MEDICATIONS STARTED DURING THIS VISIT:  ED Discharge Orders         Ordered    methocarbamol (ROBAXIN) 500 MG tablet  4 times daily     03/19/20 1757    predniSONE (DELTASONE) 10 MG tablet  Daily    Note to Pharmacy: Take 6 pills x 2 days, 5 pills x 2 days, 4 pills x 2 days, 3 pills x 2 days, 2 pills x 2 days, and 1 pill x 2 days   03/19/20 1757    meloxicam (MOBIC) 15 MG tablet  Daily     03/19/20 1757    HYDROcodone-acetaminophen (NORCO/VICODIN) 5-325 MG tablet  Every 4 hours PRN     03/19/20 1757              This chart was dictated using voice recognition software/Dragon. Despite best efforts to proofread, errors can occur which can change the meaning. Any change was purely unintentional.     Darletta Moll, PA-C 03/19/20 1809    Lilia Pro., MD 03/20/20 [phone]

## 2020-03-26 ENCOUNTER — Emergency Department
Admission: EM | Admit: 2020-03-26 | Discharge: 2020-03-26 | Disposition: A | Discharge status: Home / Self Care | Payer: Medicare Other | Attending: Student in an Organized Health Care Education/Training Program | Admitting: Student in an Organized Health Care Education/Training Program

## 2020-03-26 ENCOUNTER — Encounter: Payer: Self-pay | Admitting: Emergency Medicine

## 2020-03-26 ENCOUNTER — Other Ambulatory Visit: Payer: Self-pay

## 2020-03-26 DIAGNOSIS — F17210 Nicotine dependence, cigarettes, uncomplicated: Secondary | ICD-10-CM | POA: Diagnosis not present

## 2020-03-26 DIAGNOSIS — M5136 Other intervertebral disc degeneration, lumbar region: Secondary | ICD-10-CM | POA: Insufficient documentation

## 2020-03-26 DIAGNOSIS — G8929 Other chronic pain: Secondary | ICD-10-CM | POA: Diagnosis not present

## 2020-03-26 DIAGNOSIS — M5442 Lumbago with sciatica, left side: Secondary | ICD-10-CM | POA: Insufficient documentation

## 2020-03-26 DIAGNOSIS — M48061 Spinal stenosis, lumbar region without neurogenic claudication: Secondary | ICD-10-CM | POA: Diagnosis not present

## 2020-03-26 DIAGNOSIS — M545 Low back pain: Secondary | ICD-10-CM | POA: Diagnosis present

## 2020-03-26 LAB — URINALYSIS, COMPLETE (UACMP) WITH MICROSCOPIC
Bilirubin Urine: NEGATIVE
Glucose, UA: NEGATIVE mg/dL
Hgb urine dipstick: NEGATIVE
Ketones, ur: NEGATIVE mg/dL
Nitrite: NEGATIVE
Protein, ur: NEGATIVE mg/dL
Specific Gravity, Urine: 1.005 (ref 1.005–1.030)
pH: 6 (ref 5.0–8.0)

## 2020-03-26 MED ORDER — BACLOFEN 5 MG PO TABS: 1.0000 | ORAL_TABLET | Freq: Three times a day (TID) | ORAL | 0 refills | Status: DC | PRN

## 2020-03-26 MED ORDER — LIDOCAINE 5 % EX PTCH
1.0000 | MEDICATED_PATCH | CUTANEOUS | Status: DC
  Administered 2020-03-26: 1 via TRANSDERMAL
  Filled 2020-03-26: qty 1

## 2020-03-26 MED ORDER — METHYLPREDNISOLONE SODIUM SUCC 125 MG IJ SOLR
80.0000 mg | Freq: Once | INTRAMUSCULAR | Status: AC
  Administered 2020-03-26: 80 mg via INTRAVENOUS
  Filled 2020-03-26: qty 2

## 2020-03-26 MED ORDER — OXYCODONE-ACETAMINOPHEN 5-325 MG PO TABS: 1.0000 | ORAL_TABLET | Freq: Four times a day (QID) | ORAL | 0 refills | Status: DC | PRN

## 2020-03-26 MED ORDER — LIDOCAINE 5 % EX PTCH: 1.0000 | MEDICATED_PATCH | Freq: Two times a day (BID) | CUTANEOUS | 0 refills | Status: DC

## 2020-03-26 NOTE — ED Triage Notes (Signed)
Pt in via POV, complaints of ongoing lower back pain with radiation down into left leg, reports being sent here from Ortho due to MD unable to perform exam due to pain.    Pt ambulatory with walker.  NAD noted at this time.

## 2020-03-26 NOTE — ED Notes (Signed)
Pt states she was compliant w/ meds from prior ED visit. Pt states meds "just knocked her out and upon waking she was still in pain".

## 2020-03-26 NOTE — ED Provider Notes (Signed)
[facility] Emergency Department Provider Note  ____________________________________________   First MD Initiated Contact with Patient 03/26/20 1107     (approximate)  I have reviewed the triage vital signs and the nursing notes.   HISTORY  Chief Complaint Back Pain   HPI Kristen Hudson is a 54 y.o. female presents to the ED with complaint of low back pain with radiation into her left hip and upper thigh.  Patient was seen in the ED on 03/19/2020 for chronic low back pain and was referred to neurosurgery for follow-up.  Patient was brought over by [facility] for evaluation as they were unable to do examine her while in the office due to pain.  While in the emergency department patient was given a prescription for methocarbamol, hydrocodone and prednisone taper.  Patient states that the muscle relaxant "knocked her out" but she continued to have pain.  She is on her last 2 days of prednisone.  She reports that she got relief at the very beginning of the prednisone taper but as she is tapered off of it has had more pain.  She denies any recent injury to her back or changes such as incontinence of bowel or bladder or saddle anesthesias.  Patient did drive herself to the emergency department and reports that there is no one that can pick her up from the ED.  Currently she rates her pain as 10/10.     Past Medical History:  Diagnosis Date  . Acid reflux   . Seizures [facility])     Patient Active Problem List   Diagnosis Date Noted  . Chronic pelvic pain in female 08/24/2018  . Status post bilateral salpingo-oophorectomy (BSO) 08/24/2018  . History of 3 cesarean sections 08/24/2018  . Vasomotor symptoms due to menopause 08/24/2018  . BMI 33.0-33.9,adult 08/24/2018  . Urinary urgency 08/24/2018  . Urinary frequency 08/24/2018    Past Surgical History:  Procedure Laterality Date  . APPENDECTOMY    . CESAREAN SECTION    . CHOLECYSTECTOMY    . COLON RESECTION      . OVARY SURGERY      Prior to Admission medications   Medication Sig Start Date End Date Taking? Authorizing Provider  Baclofen 5 MG TABS Take 1 tablet by mouth 3 (three) times daily as needed (Muscle spasms). 03/26/20   Johnn Hai, PA-C  cyclobenzaprine (FLEXERIL) 5 MG tablet TAKE 1 TO 2 TABLETS BY MOUTH THREE TIMES DAILY AS NEEDED FOR MUSCLE SPASM 05/04/19   [provider]  diazepam (VALIUM) 5 MG tablet Take 1 tablet (5 mg total) by mouth every 6 (six) hours as needed (vaginal valium). Patient taking differently: Take 5 mg by mouth every 6 (six) hours as needed.  12/13/18   Philip Aspen, CNM  gabapentin (NEURONTIN) 300 MG capsule 300mg  qHS x 7 d, then 600mg  qHS x 7 d, then 300mg  qAM and 600mg  qHS x 7 d, then 300mg  qAM, 300mg  q-afternoon and 600mg  qHS 05/04/19   [provider]  levETIRAcetam (KEPPRA) 500 MG tablet Take 500 mg by mouth 2 (two) times daily.  12/16/17   [provider]  lidocaine (LIDODERM) 5 % Place 1 patch onto the skin every 12 (twelve) hours. Remove & Discard patch within 12 hours or as directed by MD 03/26/20 03/26/21  Johnn Hai, PA-C  meloxicam (MOBIC) 15 MG tablet Take 1 tablet (15 mg total) by mouth daily. 03/19/20   Cuthriell, Charline Bills, PA-C  methocarbamol (ROBAXIN) 500 MG tablet  Take 1 tablet (500 mg total) by mouth 4 (four) times daily. 03/19/20   Cuthriell, Charline Bills, PA-C  oxyCODONE-acetaminophen (PERCOCET) 5-325 MG tablet Take 1 tablet by mouth every 6 (six) hours as needed for severe pain. 03/26/20   Johnn Hai, PA-C  phenytoin (DILANTIN) 100 MG ER capsule Take 200 mg by mouth 2 (two) times daily. 06/03/18   [provider]  phenytoin (DILANTIN) 125 MG/5ML suspension Take 125 mg by mouth 2 (two) times daily.     [provider]  predniSONE (DELTASONE) 10 MG tablet Take 1 tablet (10 mg total) by mouth daily. 03/19/20   Cuthriell, Charline Bills, PA-C  traZODone (DESYREL) 50 MG tablet Take 50 mg by mouth daily.  08/06/19   [provider]    Allergies Penicillins  Family History  Problem Relation Age of Onset  . Breast cancer Mother 24  . Colon cancer Father   . Diabetes Father   . Ovarian cancer Neg Hx     Social History Social History   Tobacco Use  . Smoking status: Current Every Day Smoker    Packs/day: 0.25    Types: Cigarettes  . Smokeless tobacco: Never Used  Substance Use Topics  . Alcohol use: Not Currently  . Drug use: Not Currently    Review of Systems Constitutional: No fever/chills Eyes: No visual changes. ENT: No sore throat. Cardiovascular: Denies chest pain. Respiratory: Denies shortness of breath. Gastrointestinal: No abdominal pain.  No nausea, no vomiting.  No diarrhea.  No constipation. Genitourinary: Negative for dysuria. Musculoskeletal: Positive for chronic back pain. Skin: Negative for rash. Neurological: Negative for headaches, focal weakness or numbness. ____________________________________________   PHYSICAL EXAM:  VITAL SIGNS: ED Triage Vitals  Enc Vitals Group     BP 03/26/20 1055 135/89     Pulse Rate 03/26/20 1055 84     Resp 03/26/20 1055 19     Temp 03/26/20 1055 (!) 97.1 F (36.2 C)     Temp Source 03/26/20 1055 Oral     SpO2 03/26/20 1055 100 %     Weight 03/26/20 1056 209 lb (94.8 kg)     Height 03/26/20 1056 5\' 4"  (1.626 m)     Head Circumference --      Peak Flow --      Pain Score 03/26/20 1055 10     Pain Loc --      Pain Edu? --      Excl. in [place]? --     Constitutional: Alert and oriented. Well appearing and in no acute distress. Eyes: Conjunctivae are normal. PERRL. EOMI. Head: Atraumatic. Nose: No congestion/rhinnorhea. Neck: No stridor.   Cardiovascular: Normal rate, regular rhythm. Grossly normal heart sounds.  Good peripheral circulation. Respiratory: Normal respiratory effort.  No retractions. Lungs CTAB. Gastrointestinal: Soft and nontender. No distention. Musculoskeletal: Patient is able to move  upper extremities that any difficulty.  There is no point tenderness or step-offs noted on palpation of the thoracic or lumbar spine.  There is marked tenderness on palpation of the L5-S1 and sacral area.  There is also increased tenderness on palpation of the left SI joint and surrounding soft tissue.  Good muscle strength was noted bilaterally.  Straight leg raises were difficult secondary to patient's pain and was approximately 20 degrees bilaterally with pain to her left hip and left low back. Neurologic:  Normal speech and language. No gross focal neurologic deficits are appreciated.  Skin:  Skin is warm, dry and intact.  Psychiatric: Mood  and affect are normal. Speech and behavior are normal.  ____________________________________________   LABS (all labs ordered are listed, but only abnormal results are displayed)  Labs Reviewed  URINALYSIS, COMPLETE (UACMP) WITH MICROSCOPIC - Abnormal; Notable for the following components:      Result Value   Color, Urine YELLOW (*)    APPearance CLEAR (*)    Leukocytes,Ua SMALL (*)    Bacteria, UA RARE (*)    All other components within normal limits    PROCEDURES  Procedure(s) performed (including Critical Care):  Procedures  ____________________________________________   INITIAL IMPRESSION / ASSESSMENT AND PLAN / ED COURSE  As part of my medical decision making, I reviewed the following data within the electronic MEDICAL RECORD NUMBER Notes from prior ED visits and  Controlled Substance Database  54 year old female presents to the ED with exacerbation of her chronic low back pain while in [facility] being seen by neurosurgeon.  She was brought to the ED for evaluation and control of her pain.  Patient states there has been no new injury and no changes in her symptoms.  She states that the methocarbamol/?  Flexeril was the muscle relaxant that "knocked her out".  Patient has completed the hydrocodone that was given to her and is on her  last 2 days of a prednisone taper.  CT scan from her previous ED visit was reviewed.  Patient has multiple levels of DDD with disc bulging.  Urinalysis was negative for a UTI.  Patient was encouraged to make a follow-up visit with neurosurgery to continue for evaluation of her chronic low back pain.  Patient was given Solu-Medrol 80 mg IM, a Lidoderm patch while in the ED.  Patient was made aware that these medications would not cause drowsiness since she is driving.  A prescription for continued Lidoderm patches, prednisone 5 mg every 6 hours and baclofen 5 mg 3 times daily was sent to the pharmacy.  Patient is aware that she will need to discontinue taking the muscle relaxants that she has at home.  She is return to the emergency department if any severe worsening of her symptoms such as incontinence of bowel or bladder.  ____________________________________________   FINAL CLINICAL IMPRESSION(S) / ED DIAGNOSES  Final diagnoses:  Chronic left-sided low back pain with left-sided sciatica  Degenerative disc disease, lumbar     ED Discharge Orders         Ordered    lidocaine (LIDODERM) 5 %  Every 12 hours     03/26/20 1219    oxyCODONE-acetaminophen (PERCOCET) 5-325 MG tablet  Every 6 hours PRN     03/26/20 1219    Baclofen 5 MG TABS  3 times daily PRN     03/26/20 1219           Note:  This document was prepared using Dragon voice recognition software and may include unintentional dictation errors.    Johnn Hai, PA-C 03/26/20 1425    Merlyn Lot, MD 03/26/20 [phone]

## 2020-03-26 NOTE — Discharge Instructions (Addendum)
Follow-up with your primary care provider if any continued problems and reschedule with Dr. Lacinda Axon who is the neurosurgeon that you were seen today.  Finish the prednisone today and tomorrow.  Discontinue taking the methocarbamol which is the muscle relaxant that was prescribed for you on your last ED visit.  Begin taking the baclofen which may not cause drowsiness.Also also prescription for Lidoderm patches was sent to your pharmacy along with pain medication.  Use these medications only as directed.

## 2020-03-27 ENCOUNTER — Other Ambulatory Visit: Payer: Self-pay | Admitting: Neurosurgery

## 2020-03-27 DIAGNOSIS — M5442 Lumbago with sciatica, left side: Secondary | ICD-10-CM

## 2020-03-31 ENCOUNTER — Other Ambulatory Visit: Payer: Self-pay

## 2020-03-31 ENCOUNTER — Ambulatory Visit
Admission: RE | Admit: 2020-03-31 | Discharge: 2020-03-31 | Disposition: A | Discharge status: Home / Self Care | Payer: Medicare Other | Source: Ambulatory Visit | Attending: Neurosurgery | Admitting: Neurosurgery

## 2020-03-31 DIAGNOSIS — S3992XA Unspecified injury of lower back, initial encounter: Secondary | ICD-10-CM | POA: Diagnosis not present

## 2020-03-31 DIAGNOSIS — M5442 Lumbago with sciatica, left side: Secondary | ICD-10-CM | POA: Diagnosis not present

## 2020-03-31 IMAGING — MR MR LUMBAR SPINE W/O CM
5 series · 31 of 48 positions shown · non-contrast
Comparison: CT scan [DATE]

CLINICAL DATA: Lifting injury 2 weeks ago. Low back and left leg
pain.

EXAM:
MRI LUMBAR SPINE WITHOUT CONTRAST
TECHNIQUE: Multiplanar, multisequence MR imaging of the lumbar spine was
performed. No intravenous contrast was administered.

[Series 5: T2 · sagittal · 4.0mm · 0.81mm/px · 7 of 17 slices shown (1 of 2)]
[im 1/17]
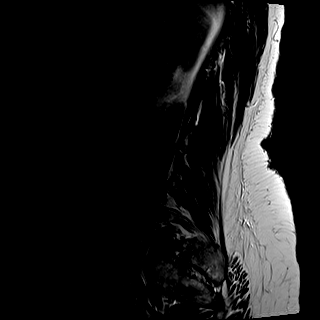
[im 3/17]
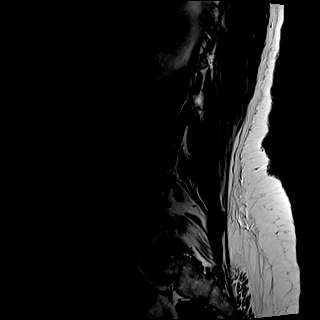
[im 6/17]
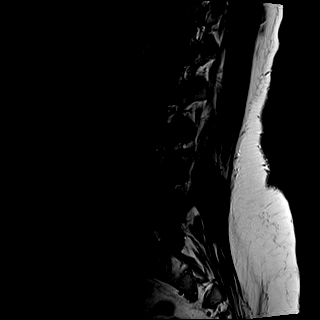
[im 9/17]
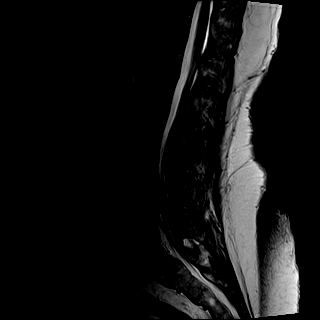
[im 11/17]
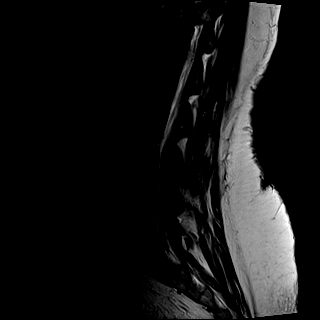
[im 14/17]
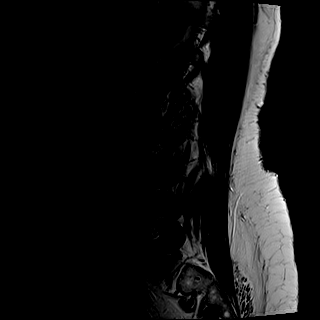
[im 17/17]
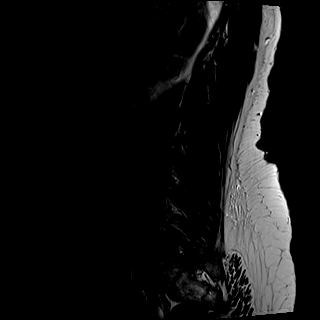

[Series 6: T1 · sagittal · 4.0mm · 0.81mm/px · 7 of 17 slices shown (1 of 2)]
[im 1/17]
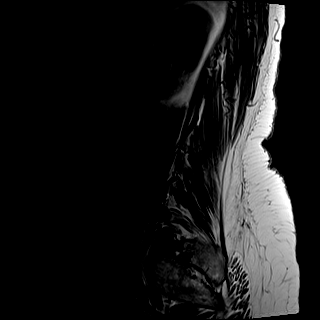
[im 3/17]
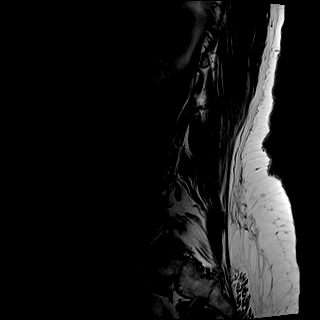
[im 6/17]
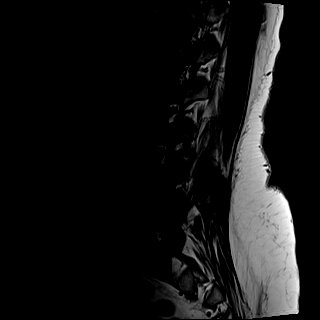
[im 9/17]
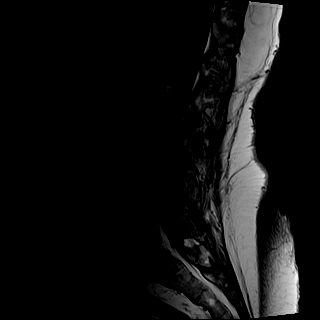
[im 11/17]
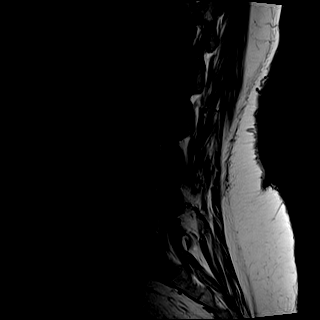
[im 14/17]
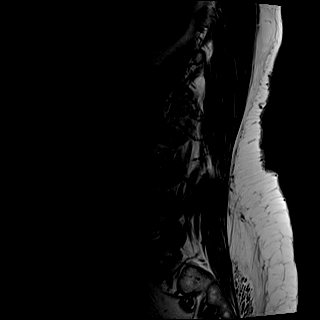
[im 17/17]
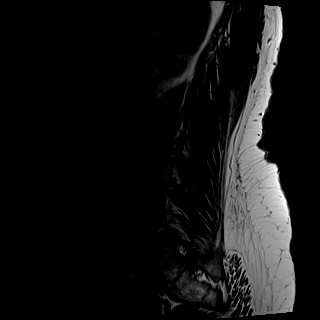

[Series 7: STIR · sagittal · 4.0mm · 0.41mm/px · 1 of 17 slices shown]
[im 1/17]
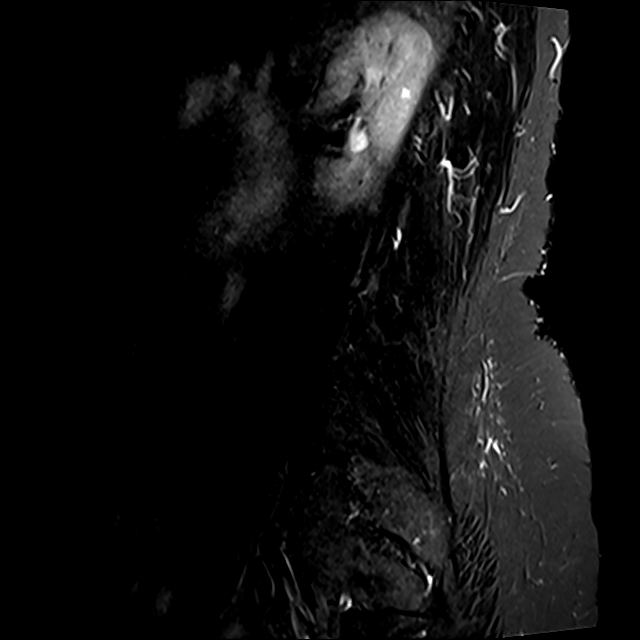

[Series 8: T2 · axial · 4.0mm · 0.78mm/px · z∈[-105,+115]mm · 8 of 38 slices shown (2 of 2)]
[im 1/38]
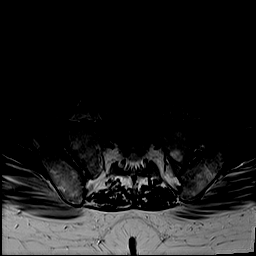
[im 6/38]
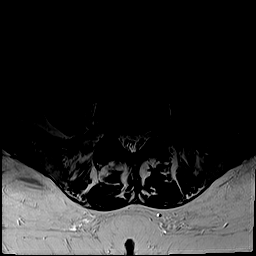
[im 12/38]
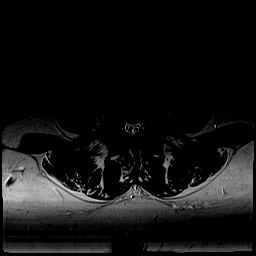
[im 18/38]
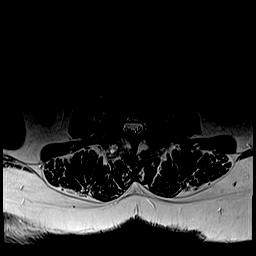
[im 20/38]
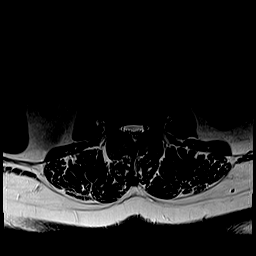
[im 26/38]
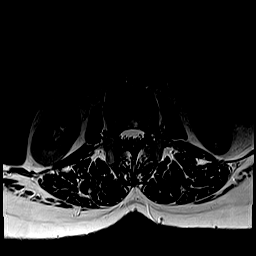
[im 32/38]
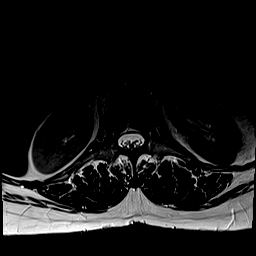
[im 38/38]
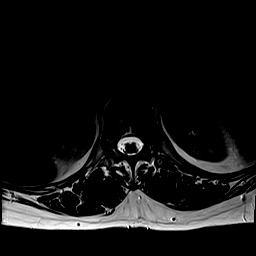

[Series 9: T1 · axial · 4.0mm · 0.39mm/px · z∈[-105,+115]mm · 8 of 38 slices shown (2 of 2)]
[im 1/38]
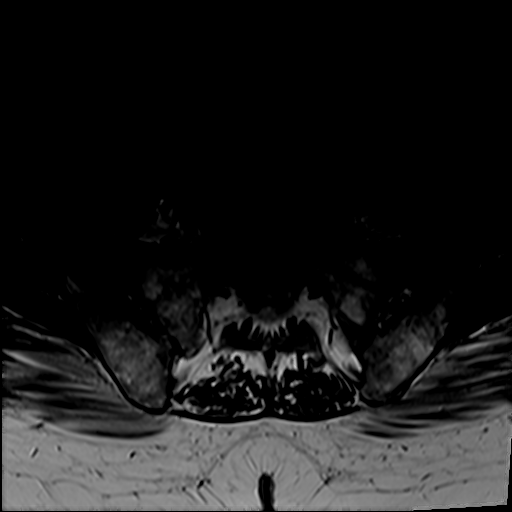
[im 6/38]
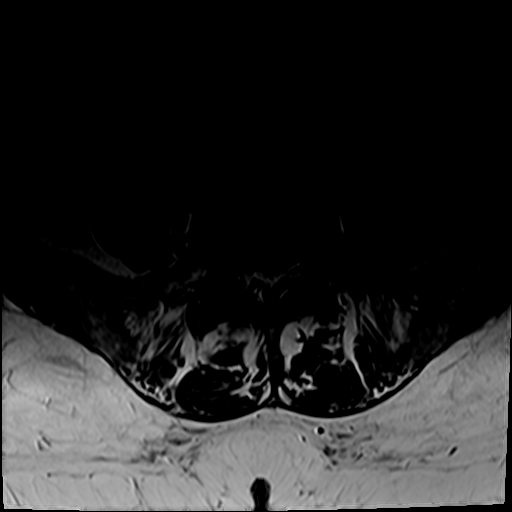
[im 12/38]
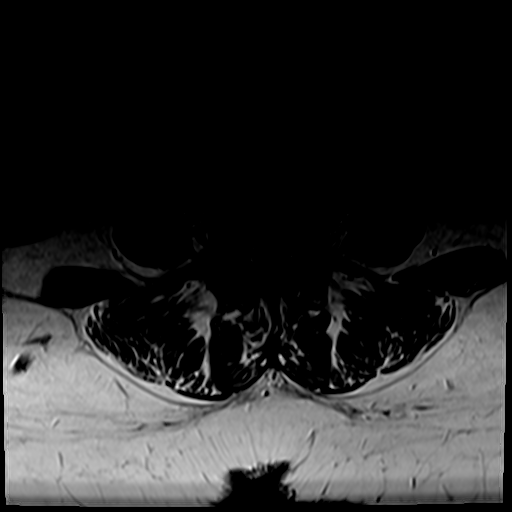
[im 18/38]
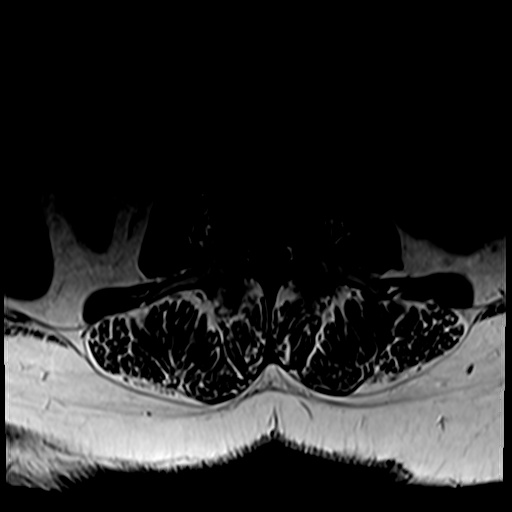
[im 20/38]
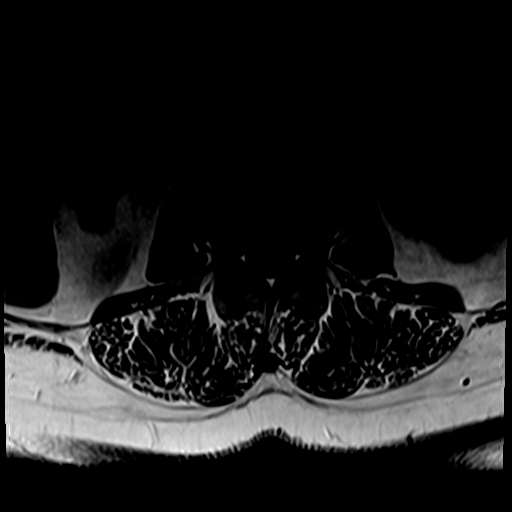
[im 26/38]
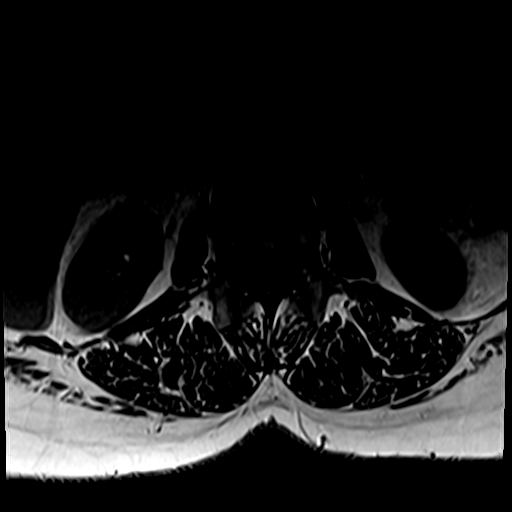
[im 32/38]
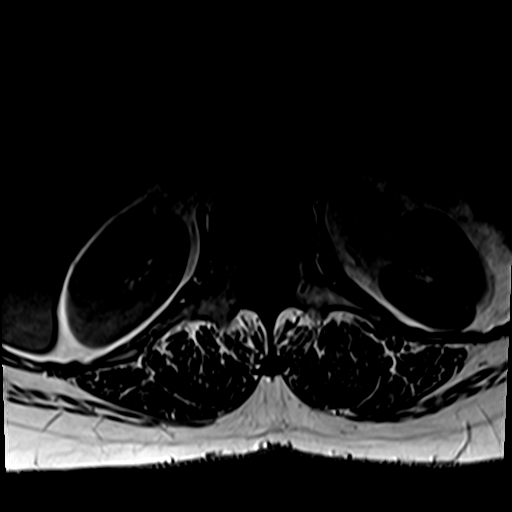
[im 38/38]
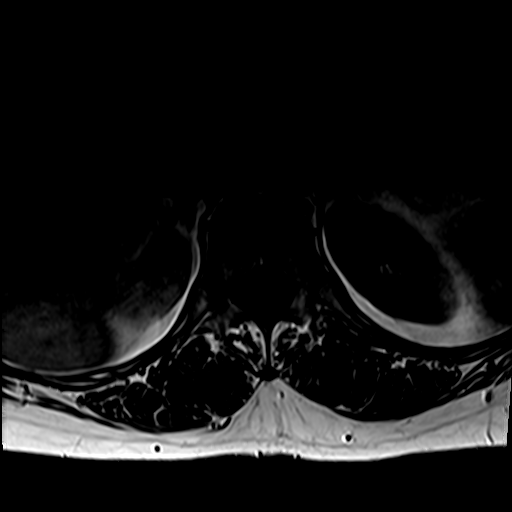

[31 of 48 positions shown; findings below may reference images not displayed]

FINDINGS: Segmentation: There are five lumbar type vertebral bodies. The last
full intervertebral disc space is labeled L5-S1. This correlates
with the CT scan.

Alignment:  Normal

Vertebrae:  Normal marrow signal. No bone lesions or fractures.

Conus medullaris and cauda equina: Conus extends to the L1 level.
Conus and cauda equina appear normal.

Paraspinal and other soft tissues: No significant paraspinal or
retroperitoneal findings.

Disc levels:

T12-L1: No significant findings.

L1-2: No significant findings.

L2-3: Mild facet disease but no disc protrusions, spinal or
foraminal stenosis.

L3-4: Moderate facet disease with ligamentum flavum thickening and
short pedicles contributing to mild to moderate spinal and bilateral
lateral recess stenosis. No foraminal stenosis.

L4-5: Bulging degenerated annulus and facet disease contributing to
mild spinal and moderate bilateral lateral recess stenosis. There is
also a shallow foraminal/extraforaminal disc protrusion on the right
which appears to come close to contacting the extraforaminal right
L4 nerve root.

L5-S1: No disc protrusions, spinal or foraminal stenosis. Moderate
facet disease, right greater than left.
IMPRESSION: 1. Mild to moderate spinal and bilateral lateral recess stenosis at
L3-4.
2. Mild spinal and moderate bilateral lateral recess stenosis at
L4-5. There is also a shallow broad-based foraminal and
extraforaminal disc protrusion on the right likely involving the
extraforaminal right L4 nerve root.

## 2020-04-01 DIAGNOSIS — M48062 Spinal stenosis, lumbar region with neurogenic claudication: Secondary | ICD-10-CM | POA: Diagnosis not present

## 2020-04-01 DIAGNOSIS — M797 Fibromyalgia: Secondary | ICD-10-CM | POA: Diagnosis not present

## 2020-04-03 DIAGNOSIS — M48062 Spinal stenosis, lumbar region with neurogenic claudication: Secondary | ICD-10-CM | POA: Diagnosis not present

## 2020-04-03 DIAGNOSIS — M5442 Lumbago with sciatica, left side: Secondary | ICD-10-CM | POA: Diagnosis not present

## 2020-04-09 DIAGNOSIS — R531 Weakness: Secondary | ICD-10-CM | POA: Diagnosis not present

## 2020-04-09 DIAGNOSIS — M545 Low back pain: Secondary | ICD-10-CM | POA: Diagnosis not present

## 2020-04-09 DIAGNOSIS — R200 Anesthesia of skin: Secondary | ICD-10-CM | POA: Diagnosis not present

## 2020-04-09 DIAGNOSIS — H8110 Benign paroxysmal vertigo, unspecified ear: Secondary | ICD-10-CM | POA: Diagnosis not present

## 2020-04-09 DIAGNOSIS — M6281 Muscle weakness (generalized): Secondary | ICD-10-CM | POA: Diagnosis not present

## 2020-04-09 DIAGNOSIS — G8929 Other chronic pain: Secondary | ICD-10-CM | POA: Diagnosis not present

## 2020-04-09 DIAGNOSIS — M1990 Unspecified osteoarthritis, unspecified site: Secondary | ICD-10-CM | POA: Diagnosis not present

## 2020-04-09 DIAGNOSIS — R251 Tremor, unspecified: Secondary | ICD-10-CM | POA: Diagnosis not present

## 2020-04-09 DIAGNOSIS — M48061 Spinal stenosis, lumbar region without neurogenic claudication: Secondary | ICD-10-CM | POA: Diagnosis not present

## 2020-04-09 DIAGNOSIS — R569 Unspecified convulsions: Secondary | ICD-10-CM | POA: Diagnosis not present

## 2020-04-18 DIAGNOSIS — R911 Solitary pulmonary nodule: Secondary | ICD-10-CM | POA: Diagnosis not present

## 2020-04-18 DIAGNOSIS — M797 Fibromyalgia: Secondary | ICD-10-CM | POA: Diagnosis not present

## 2020-04-18 DIAGNOSIS — G4733 Obstructive sleep apnea (adult) (pediatric): Secondary | ICD-10-CM | POA: Diagnosis not present

## 2020-04-22 ENCOUNTER — Ambulatory Visit: Payer: Medicare Other

## 2020-04-24 ENCOUNTER — Ambulatory Visit: Payer: Medicare Other

## 2020-04-29 ENCOUNTER — Ambulatory Visit: Payer: Medicare Other | Admitting: Physical Therapy

## 2020-04-30 ENCOUNTER — Other Ambulatory Visit: Payer: Self-pay

## 2020-04-30 ENCOUNTER — Encounter: Payer: Self-pay | Admitting: Emergency Medicine

## 2020-04-30 ENCOUNTER — Emergency Department
Admission: EM | Admit: 2020-04-30 | Discharge: 2020-04-30 | Disposition: A | Discharge status: Home / Self Care | Payer: Medicare Other | Attending: Student in an Organized Health Care Education/Training Program | Admitting: Student in an Organized Health Care Education/Training Program

## 2020-04-30 DIAGNOSIS — F17210 Nicotine dependence, cigarettes, uncomplicated: Secondary | ICD-10-CM | POA: Insufficient documentation

## 2020-04-30 DIAGNOSIS — W268XXA Contact with other sharp object(s), not elsewhere classified, initial encounter: Secondary | ICD-10-CM | POA: Diagnosis not present

## 2020-04-30 DIAGNOSIS — Y939 Activity, unspecified: Secondary | ICD-10-CM | POA: Insufficient documentation

## 2020-04-30 DIAGNOSIS — Y999 Unspecified external cause status: Secondary | ICD-10-CM | POA: Diagnosis not present

## 2020-04-30 DIAGNOSIS — S61211A Laceration without foreign body of left index finger without damage to nail, initial encounter: Secondary | ICD-10-CM | POA: Diagnosis not present

## 2020-04-30 DIAGNOSIS — S61311A Laceration without foreign body of left index finger with damage to nail, initial encounter: Secondary | ICD-10-CM | POA: Diagnosis not present

## 2020-04-30 DIAGNOSIS — Y929 Unspecified place or not applicable: Secondary | ICD-10-CM | POA: Diagnosis not present

## 2020-04-30 DIAGNOSIS — S6992XA Unspecified injury of left wrist, hand and finger(s), initial encounter: Secondary | ICD-10-CM | POA: Diagnosis present

## 2020-04-30 MED ORDER — TETANUS-DIPHTH-ACELL PERTUSSIS 5-2.5-18.5 LF-MCG/0.5 IM SUSP
0.5000 mL | Freq: Once | INTRAMUSCULAR | Status: AC
  Administered 2020-04-30: 0.5 mL via INTRAMUSCULAR
  Filled 2020-04-30: qty 0.5

## 2020-04-30 MED ORDER — LIDOCAINE HCL (PF) 1 % IJ SOLN
5.0000 mL | Freq: Once | INTRAMUSCULAR | Status: AC
  Administered 2020-04-30: 10:00:00 5 mL
  Filled 2020-04-30: qty 5

## 2020-04-30 MED ORDER — TRAMADOL HCL 50 MG PO TABS: 50.0000 mg | ORAL_TABLET | Freq: Four times a day (QID) | ORAL | 0 refills | Status: DC | PRN

## 2020-04-30 NOTE — ED Notes (Signed)
See triage note  States she hit her left hand in fan last pm  Laceration to left index finger

## 2020-04-30 NOTE — Discharge Instructions (Signed)
Follow discharge care instructions and take medication as needed for pain.

## 2020-04-30 NOTE — ED Triage Notes (Signed)
Left index finger lacerations from fan.  Injured hand last night.  Bleeding controlled.

## 2020-04-30 NOTE — ED Provider Notes (Signed)
[facility] Emergency Department Provider Note   ____________________________________________   First MD Initiated Contact with Patient 04/30/20 [phone]     (approximate)  I have reviewed the triage vital signs and the nursing notes.   HISTORY  Chief Complaint Finger Injury    HPI Kristen Hudson is a 54 y.o. female patient presents with left index finger laceration secondary to a fan cut last night.  Patient said her left her friend does not have a cover and when she moved it she was cut by the plastic blade of the fan.  Bleeding was controlled with direct pressure.  Patient denies loss sensation or loss of function.  Patient rates pain as 8/10.  Patient described the pain as "sore".  No palliative measures for complaint.         Past Medical History:  Diagnosis Date  . Acid reflux   . Seizures [facility])     Patient Active Problem List   Diagnosis Date Noted  . Chronic pelvic pain in female 08/24/2018  . Status post bilateral salpingo-oophorectomy (BSO) 08/24/2018  . History of 3 cesarean sections 08/24/2018  . Vasomotor symptoms due to menopause 08/24/2018  . BMI 33.0-33.9,adult 08/24/2018  . Urinary urgency 08/24/2018  . Urinary frequency 08/24/2018    Past Surgical History:  Procedure Laterality Date  . APPENDECTOMY    . CESAREAN SECTION    . CHOLECYSTECTOMY    . COLON RESECTION    . OVARY SURGERY      Prior to Admission medications   Medication Sig Start Date End Date Taking? Authorizing Provider  Baclofen 5 MG TABS Take 1 tablet by mouth 3 (three) times daily as needed (Muscle spasms). 03/26/20   Johnn Hai, PA-C  cyclobenzaprine (FLEXERIL) 5 MG tablet TAKE 1 TO 2 TABLETS BY MOUTH THREE TIMES DAILY AS NEEDED FOR MUSCLE SPASM 05/04/19   [provider]  diazepam (VALIUM) 5 MG tablet Take 1 tablet (5 mg total) by mouth every 6 (six) hours as needed (vaginal valium). Patient taking differently: Take 5 mg by mouth every 6 (six)  hours as needed.  12/13/18   Philip Aspen, CNM  gabapentin (NEURONTIN) 300 MG capsule 300mg  qHS x 7 d, then 600mg  qHS x 7 d, then 300mg  qAM and 600mg  qHS x 7 d, then 300mg  qAM, 300mg  q-afternoon and 600mg  qHS 05/04/19   [provider]  levETIRAcetam (KEPPRA) 500 MG tablet Take 500 mg by mouth 2 (two) times daily.  12/16/17   [provider]  lidocaine (LIDODERM) 5 % Place 1 patch onto the skin every 12 (twelve) hours. Remove & Discard patch within 12 hours or as directed by MD 03/26/20 03/26/21  Johnn Hai, PA-C  meloxicam (MOBIC) 15 MG tablet Take 1 tablet (15 mg total) by mouth daily. 03/19/20   Cuthriell, Charline Bills, PA-C  methocarbamol (ROBAXIN) 500 MG tablet Take 1 tablet (500 mg total) by mouth 4 (four) times daily. 03/19/20   Cuthriell, Charline Bills, PA-C  oxyCODONE-acetaminophen (PERCOCET) 5-325 MG tablet Take 1 tablet by mouth every 6 (six) hours as needed for severe pain. 03/26/20   Johnn Hai, PA-C  phenytoin (DILANTIN) 100 MG ER capsule Take 200 mg by mouth 2 (two) times daily. 06/03/18   [provider]  phenytoin (DILANTIN) 125 MG/5ML suspension Take 125 mg by mouth 2 (two) times daily.     [provider]  predniSONE (DELTASONE) 10 MG tablet Take 1 tablet (10 mg total) by mouth daily. 03/19/20  Cuthriell, Charline Bills, PA-C  traMADol (ULTRAM) 50 MG tablet Take 1 tablet (50 mg total) by mouth every 6 (six) hours as needed for moderate pain. 04/30/20   Sable Feil, PA-C  traZODone (DESYREL) 50 MG tablet Take 50 mg by mouth daily. 08/06/19   [provider]    Allergies Penicillins  Family History  Problem Relation Age of Onset  . Breast cancer Mother 31  . Colon cancer Father   . Diabetes Father   . Ovarian cancer Neg Hx     Social History Social History   Tobacco Use  . Smoking status: Current Every Day Smoker    Packs/day: 0.25    Types: Cigarettes  . Smokeless tobacco: Never Used  Substance Use Topics  . Alcohol use:  Not Currently  . Drug use: Not Currently    Review of Systems Constitutional: No fever/chills Eyes: No visual changes. ENT: No sore throat. Cardiovascular: Denies chest pain. Respiratory: Denies shortness of breath. Gastrointestinal: No abdominal pain.  No nausea, no vomiting.  No diarrhea.  No constipation. Genitourinary: Negative for dysuria. Musculoskeletal: Negative for back pain. Skin: Negative for rash.  Left index finger laceration. Neurological: Negative for headaches, focal weakness or numbness. Allergic/Immunilogical: Penicillin.  ____________________________________________   PHYSICAL EXAM:  VITAL SIGNS: ED Triage Vitals  Enc Vitals Group     BP 04/30/20 0846 118/74     Pulse Rate 04/30/20 0846 83     Resp 04/30/20 0846 18     Temp 04/30/20 0846 98.1 F (36.7 C)     Temp Source 04/30/20 0846 Oral     SpO2 04/30/20 0846 98 %     Weight 04/30/20 0839 208 lb 15.9 oz (94.8 kg)     Height 04/30/20 0839 5\' 4"  (1.626 m)     Head Circumference --      Peak Flow --      Pain Score 04/30/20 0839 8     Pain Loc --      Pain Edu? --      Excl. in [place]? --    Constitutional: Alert and oriented. Well appearing and in no acute distress.  Anxious. Cardiovascular: Normal rate, regular rhythm. Grossly normal heart sounds.  Good peripheral circulation. Respiratory: Normal respiratory effort.  No retractions. Lungs CTAB. Skin: 0.5 cm laceration left index finger.   Psychiatric: Mood and affect are normal. Speech and behavior are normal.  ____________________________________________   LABS (all labs ordered are listed, but only abnormal results are displayed)  Labs Reviewed - No data to display ____________________________________________  EKG   ____________________________________________  RADIOLOGY  ED MD interpretation:    Official radiology report(s): No results found.  ____________________________________________   PROCEDURES  Procedure(s) performed  (including Critical Care):  Marland KitchenMarland KitchenLaceration Repair  Date/Time: 04/30/2020 10:33 AM Performed by: Sable Feil, PA-C Authorized by: Sable Feil, PA-C   Consent:    Consent obtained:  Verbal   Consent given by:  Patient   Risks discussed:  Infection, pain, poor cosmetic result and need for additional repair Anesthesia (see MAR for exact dosages):    Anesthesia method:  Local infiltration Laceration details:    Location:  Finger   Finger location:  L index finger   Length (cm):  0.5   Depth (mm):  1 Repair type:    Repair type:  Simple Pre-procedure details:    Preparation:  Patient was prepped and draped in usual sterile fashion Exploration:    Wound exploration: wound explored through full range of  motion     Contaminated: no   Treatment:    Area cleansed with:  Betadine and saline   Amount of cleaning:  Standard Skin repair:    Repair method:  Tissue adhesive Approximation:    Approximation:  Close Post-procedure details:    Dressing:  Adhesive bandage   Patient tolerance of procedure:  Tolerated well, no immediate complications     ____________________________________________   INITIAL IMPRESSION / ASSESSMENT AND PLAN / ED COURSE  As part of my medical decision making, I reviewed the following data within the [place]     Patient presents with a left index finger laceration by plastic fan last night.  See procedure note for wound closure.  Patient given discharge care instructions and advised return back to ED if wound reopens for healing is complete.    Verdella Nimmer was evaluated in Emergency Department on 04/30/2020 for the symptoms described in the history of present illness. She was evaluated in the context of the global COVID-19 pandemic, which necessitated consideration that the patient might be at risk for infection with the SARS-CoV-2 virus that causes COVID-19. Institutional protocols and algorithms that pertain to the evaluation of  patients at risk for COVID-19 are in a state of rapid change based on information released by regulatory bodies including the CDC and federal and state organizations. These policies and algorithms were followed during the patient's care in the ED.       ____________________________________________   FINAL CLINICAL IMPRESSION(S) / ED DIAGNOSES  Final diagnoses:  Laceration of left index finger without foreign body with damage to nail, initial encounter     ED Discharge Orders         Ordered    traMADol (ULTRAM) 50 MG tablet  Every 6 hours PRN     04/30/20 1027           Note:  This document was prepared using Dragon voice recognition software and may include unintentional dictation errors.    Sable Feil, PA-C 04/30/20 1035    Merlyn Lot, MD 04/30/20 1416

## 2020-05-01 ENCOUNTER — Ambulatory Visit: Payer: Medicare Other

## 2020-05-03 ENCOUNTER — Other Ambulatory Visit: Payer: Self-pay | Admitting: *Deleted

## 2020-05-03 MED ORDER — MELOXICAM 15 MG PO TABS: 15.0000 mg | ORAL_TABLET | Freq: Every day | ORAL | 0 refills | Status: DC

## 2020-05-03 MED ORDER — LEVETIRACETAM 500 MG PO TABS: 500.0000 mg | ORAL_TABLET | Freq: Two times a day (BID) | ORAL | 3 refills | Status: DC

## 2020-05-03 MED ORDER — GABAPENTIN 300 MG PO CAPS: 300.0000 mg | ORAL_CAPSULE | Freq: Two times a day (BID) | ORAL | 3 refills | Status: DC

## 2020-05-06 ENCOUNTER — Other Ambulatory Visit: Payer: Self-pay | Admitting: *Deleted

## 2020-05-06 ENCOUNTER — Ambulatory Visit: Payer: Medicare Other | Admitting: Physical Therapy

## 2020-05-06 ENCOUNTER — Other Ambulatory Visit: Payer: Self-pay

## 2020-05-06 MED ORDER — MELOXICAM 15 MG PO TABS: 15.0000 mg | ORAL_TABLET | Freq: Every day | ORAL | 2 refills | Status: DC

## 2020-05-08 ENCOUNTER — Other Ambulatory Visit: Payer: Self-pay | Admitting: *Deleted

## 2020-05-08 ENCOUNTER — Ambulatory Visit: Payer: Medicare Other | Admitting: Physical Therapy

## 2020-05-08 MED ORDER — TRAZODONE HCL 50 MG PO TABS: 50.0000 mg | ORAL_TABLET | Freq: Every day | ORAL | 1 refills | Status: DC

## 2020-05-13 ENCOUNTER — Ambulatory Visit: Payer: Medicare Other | Admitting: Physical Therapy

## 2020-05-14 ENCOUNTER — Other Ambulatory Visit: Payer: Self-pay

## 2020-05-14 ENCOUNTER — Ambulatory Visit: Payer: Medicare Other | Attending: Physical Medicine & Rehabilitation

## 2020-05-14 DIAGNOSIS — R2681 Unsteadiness on feet: Secondary | ICD-10-CM | POA: Diagnosis not present

## 2020-05-14 DIAGNOSIS — G8929 Other chronic pain: Secondary | ICD-10-CM | POA: Insufficient documentation

## 2020-05-14 DIAGNOSIS — M62838 Other muscle spasm: Secondary | ICD-10-CM | POA: Diagnosis not present

## 2020-05-14 DIAGNOSIS — R262 Difficulty in walking, not elsewhere classified: Secondary | ICD-10-CM | POA: Insufficient documentation

## 2020-05-14 DIAGNOSIS — M545 Low back pain: Secondary | ICD-10-CM | POA: Diagnosis not present

## 2020-05-14 NOTE — Therapy (Signed)
[place] MAIN [facility] SERVICES [address] [place], [place], [address] Phone: [phone]   Fax:  [phone]  Physical Therapy Evaluation  Patient Details  Name: Kristen Hudson MRN: [id number] Date of Birth: 05-Jun-1966 Referring Provider (PT): Dr. Alba Destine   Encounter Date: 05/14/2020  PT End of Session - 05/14/20 1518    Visit Number  1    Number of Visits  25    Date for PT Re-Evaluation  08/06/20    Authorization Type  eval: 05/14/20    PT Start Time  1520    PT Stop Time  1615    PT Time Calculation (min)  55 min    Equipment Utilized During Treatment  Gait belt    Activity Tolerance  Patient tolerated treatment well    Behavior During Therapy  [facility] for tasks assessed/performed       Past Medical History:  Diagnosis Date  . Acid reflux   . Seizures ([place])     Past Surgical History:  Procedure Laterality Date  . APPENDECTOMY    . CESAREAN SECTION    . CHOLECYSTECTOMY    . COLON RESECTION    . OVARY SURGERY      There were no vitals filed for this visit.   Subjective Assessment - 05/15/20 1357    Subjective  Unsteadiness and LBP    Pertinent History  Pt referred to PT for unsteadiness as well as L sided low back pain. Pt reports that her balance has been an issue for years but worsened after her back pain started. Her back pain began multiple months ago after moving into her new apartment. She is unable to provide an exact time fram. She saw [facility] PT however she reports that she was in so much pain that there was nothing they could do without worsening her pain and they called her and told her they couldn't help. Based on the referral it states that the patient was too unsteady to be treated at their clinic. She reports that when her pain was really bad her legs were really shaky and were buckling. She was referred to pain management by Dr. Lavera Guise but still has not been contacted by their clinic. Denies history of any neck  pain. She reports 4-5 falls in the last 6 months when her "back was really bothering her." PMH includes seizures, migraines, BPPV, and OA. Per medical record she has been seen multiple times at [place], and [facility] for this issue. She had an MRI which showed short pedicles of the lower lumbar spine and congenital canal narrowing with moderate canal stenosis at L3-4 level. She also has moderate bilateral foraminal stenosis at L4-5. Per patient she has an appointment with a spine specialist at [facility] next week.    Limitations  Walking    Diagnostic tests  Lumbar MRI: see history    Patient Stated Goals  Improve balance and back pain    Currently in Pain?  --   Back pain history deferred to second visit      SUBJECTIVE Chief complaint: Pt referred to PT for unsteadiness as well as L sided low back pain. Pt reports that her balance has been an issue for years but worsened after her back pain started. Her back pain began multiple months ago after moving into her new apartment. She is unable to provide an exact time fram. She saw [facility] PT however she reports that she was in so much pain that there  was nothing they could do without worsening her pain and they called her and told her they couldn't help. Based on the referral it states that the patient was too unsteady to be treated at their clinic. She reports that when her pain was really bad her legs were really shaky and were buckling. She was referred to pain management by Dr. Lavera Guise but still has not been contacted by their clinic. Denies history of any neck pain. She reports 4-5 falls in the last 6 months when her "back was really bothering her." PMH includes seizures, migraines, BPPV, and OA. Per medical record she has been seen multiple times at [place], and [facility] for this issue. She had an MRI which showed short pedicles of the lower lumbar spine and congenital canal narrowing with moderate canal stenosis at L3-4 level. She also has moderate  bilateral foraminal stenosis at L4-5. Per patient she has an appointment with a spine specialist at [facility] next week.  Imaging: MRI 04/09/20: Congenitally short pedicles of the lower lumbar spine contribute to congenital canal narrowing. Hypertrophic changes at L3-L4 with moderate canal stenosis at this level. Moderate bilateral foraminal stenosis at L4-5. Recent changes in overall health/medication: No Directional pattern for falls: None Prior history of physical therapy for balance: None Follow-up appointment with MD: None scheduled Red flags (bowel/bladder changes, saddle paresthesia, personal history of cancer, chills/fever, night sweats, unrelenting pain) Negative  OBJECTIVE  MUSCULOSKELETAL: Tremor: Absent Bulk: Normal Tone: Normal, no clonus  Gait Significant bilateral toe in during gait with possible femoral anteversion. She occasionally crosses midline with stepping  Strength R/L 4/4 Hip flexion 4+/4+ Hip abduction (seated) 4+/4+ Hip adduction (seated) 5/5 Knee extension 5/5 Knee flexion Active bilaterally Ankle Plantarflexion 5/5 Ankle Dorsiflexion   NEUROLOGICAL:  Mental Status Patient is oriented to person, place and time.  Recent memory is intact.  Remote memory is intact.  Attention span and concentration are intact.  Expressive speech is intact.  Unclear of patient's education level. Reports she has been on disaiblity, "my whole life but I honestly don't know why."  Sensation Pt reports decreased sensation on LUE C5 and T2, Reports decreased sensation entire LLE L3-S2.  Reflexes R/L Deferred reflex testing Positive Hoffman bilateral UE   FUNCTIONAL OUTCOME MEASURES   Results Comments  BERG 51/56 Fall risk, in need of intervention  TUG 13.8 seconds WNL  5TSTS 12.1 seconds WNL  10 Meter Gait Speed Self-selected: 13.7s = 0.73 m/s; Fastest: 12.7s = 0.79 m/s Below normative values for full community ambulation  FOTO 56 Predicted improvement to 63  ABC  Scale 27.5% Very low confidence    POSTURAL CONTROL TESTS   Modified Clinical Test of Sensory Interaction for Balance    (CTSIB): Deferred   BPPV TESTS:  Symptoms Duration Intensity Nystagmus  L Dix-Hallpike Vertigo   None  R Dix-Hallpike Vertigo   None  L Head Roll None   None  R Head Roll None   None  L Sidelying Test      R Sidelying Test                     Objective measurements completed on examination: See above findings.              PT Education - 05/15/20 1358    Education Details  Plan of care    Person(s) Educated  Patient    Methods  Explanation    Comprehension  Verbalized understanding  PT Short Term Goals - 05/15/20 1358      PT SHORT TERM GOAL #1   Title  Pt will be independent with HEP for balance and low back pain in order to improve pain-free function at home and decrease risk for falls    Time  6    Period  Weeks    Status  New    Target Date  06/25/20        PT Long Term Goals - 05/15/20 1417      PT LONG TERM GOAL #1   Title  Pt will improve BERG by at least 3 points in order to demonstrate clinically significant improvement in balance.    Baseline  05/14/20: 51/56    Time  12    Period  Weeks    Status  New    Target Date  08/06/20      PT LONG TERM GOAL #2   Title  Pt will increase 10MWT by at least 0.13 m/s in order to demonstrate clinically significant improvement in community ambulation.    Baseline  05/14/20: Self-selected: 13.7s = 0.73 m/s; Fastest: 12.7s = 0.79 m/s    Time  12    Period  Weeks    Status  New    Target Date  08/06/20      PT LONG TERM GOAL #3   Title  Pt will improve ABC by at least 13% in order to demonstrate clinically significant improvement in balance confidence.    Baseline  05/14/20: 27.5%    Time  12    Period  Weeks    Status  New    Target Date  08/06/20             Plan - 05/14/20 1518    Clinical Impression Statement  Pt is a pleasant 54 year-old female  referred for difficulty with balance and low back pain. Overall her balance is reasonable with pt scoring 51/56 on the BERG, 12.1s on 5TSTS, and 13.8s on the TUG. Her 47m gait speed is below necessary pace for full community ambulation. She presents with very low balance confidence scoring 27.5% on ABC. She reports some vertigo however all canal testing is negative on this date. Balance assessment performed today and back will be assessed at next visit with update in goals. Pt presents with deficits in strength, gait and balance and will benefit from skilled PT services to address these deficits and decrease risk for future falls.    Personal Factors and Comorbidities  Comorbidity 3+;Time since onset of injury/illness/exacerbation;Fitness;Past/Current Experience    Comorbidities  obesity, seizures, chronic pelvic pain    Examination-Activity Limitations  Lift;Locomotion Level;Squat;Stairs;Stand    Examination-Participation Restrictions  Community Activity;Shop    Stability/Clinical Decision Making  Evolving/Moderate complexity    Clinical Decision Making  Moderate    Rehab Potential  Fair    Clinical Impairments Affecting Rehab Potential  --    PT Frequency  2x / week    PT Duration  12 weeks    PT Treatment/Interventions  ADLs/Self Care Home Management;Aquatic Therapy;Biofeedback;Electrical Stimulation;Traction;Moist Heat;Gait training;Stair training;Functional mobility training;Therapeutic activities;Therapeutic exercise;Balance training;Patient/family education;Cognitive remediation;Neuromuscular re-education;Manual techniques;Scar mobilization;Passive range of motion;Taping;Dry needling;Spinal Manipulations;Joint Manipulations;Canalith Repostioning;Cryotherapy;Iontophoresis 4mg /ml Dexamethasone;Ultrasound;DME Instruction;Vestibular    PT Next Visit Plan  Assess low back pain, update appropriate outcome measures/goals for LBP, initiate HEP for balance and LBP    PT Home Exercise Plan  None currently     Consulted and Agree with Plan of Care  Patient  Patient will benefit from skilled therapeutic intervention in order to improve the following deficits and impairments:  Decreased balance, Difficulty walking, Decreased strength, Obesity, Pain  Visit Diagnosis: Unsteadiness on feet  Difficulty in walking, not elsewhere classified     Problem List Patient Active Problem List   Diagnosis Date Noted  . Chronic pelvic pain in female 08/24/2018  . Status post bilateral salpingo-oophorectomy (BSO) 08/24/2018  . History of 3 cesarean sections 08/24/2018  . Vasomotor symptoms due to menopause 08/24/2018  . BMI 33.0-33.9,adult 08/24/2018  . Urinary urgency 08/24/2018  . Urinary frequency 08/24/2018   Phillips Grout PT, DPT, GCS  Kristen Hudson 05/15/2020, 3:01 PM   MAIN [facility] SERVICES [address] [place], [place], [address] Phone: [phone]   Fax:  [phone]  Name: Kristen Hudson MRN: [id number] Date of Birth: 01-07-1966

## 2020-05-15 ENCOUNTER — Ambulatory Visit: Payer: Medicare Other | Admitting: Physical Therapy

## 2020-05-20 ENCOUNTER — Ambulatory Visit: Payer: Medicare Other | Admitting: Physical Therapy

## 2020-05-20 ENCOUNTER — Encounter: Payer: Self-pay | Admitting: Physical Therapy

## 2020-05-20 ENCOUNTER — Other Ambulatory Visit: Payer: Self-pay

## 2020-05-20 DIAGNOSIS — R262 Difficulty in walking, not elsewhere classified: Secondary | ICD-10-CM | POA: Diagnosis not present

## 2020-05-20 DIAGNOSIS — M545 Low back pain, unspecified: Secondary | ICD-10-CM

## 2020-05-20 DIAGNOSIS — R2681 Unsteadiness on feet: Secondary | ICD-10-CM

## 2020-05-20 DIAGNOSIS — G8929 Other chronic pain: Secondary | ICD-10-CM | POA: Diagnosis not present

## 2020-05-20 DIAGNOSIS — M62838 Other muscle spasm: Secondary | ICD-10-CM | POA: Diagnosis not present

## 2020-05-20 NOTE — Patient Instructions (Signed)
Access Code: [email or web address]: [email or web address]: 05/24/2021Prepared by: Joycelyn Schmid TrotterExercises  Prone Press Up on Elbows - 1 x daily - 7 x weekly - 1 sets - 10 reps  Prone Knee Flexion - 1 x daily - 7 x weekly - 1 sets - 10 reps  Seated Correct Posture - 1 x daily - 7 x weekly - 10 reps - 3 sets  Standing Lumbar Extension with Counter - 3-4 x daily - 7 x weekly - 1 sets - 10 reps

## 2020-05-20 NOTE — Therapy (Signed)
[place] MAIN [facility] SERVICES [address] [place], [place], [address] Phone: [phone]   Fax:  [phone]  Physical Therapy Treatment  Patient Details  Name: Kristen Hudson MRN: [id number] Date of Birth: 03-23-1966 Referring Provider (PT): Dr. Alba Destine   Encounter Date: 05/20/2020  PT End of Session - 05/20/20 1024    Visit Number  2    Number of Visits  25    Date for PT Re-Evaluation  08/06/20    Authorization Type  eval: 05/14/20    PT Start Time  1016    PT Stop Time  1100    PT Time Calculation (min)  44 min    Equipment Utilized During Treatment  Gait belt    Activity Tolerance  Patient tolerated treatment well    Behavior During Therapy  [facility] for tasks assessed/performed       Past Medical History:  Diagnosis Date  . Acid reflux   . Seizures ([place])     Past Surgical History:  Procedure Laterality Date  . APPENDECTOMY    . CESAREAN SECTION    . CHOLECYSTECTOMY    . COLON RESECTION    . OVARY SURGERY      There were no vitals filed for this visit.  Subjective Assessment - 05/20/20 1019    Subjective  Patient reports a few months ago she had to pick up a box that was delivered and immediately felt chest pain from pulling a muscle and then a few days later she started having severe back pain. She reports low back pain and LLE radiculopathy; She presents to therapy without AD and is walking with supervision;    Pertinent History  Pt referred to PT for unsteadiness as well as L sided low back pain. Pt reports that her balance has been an issue for years but worsened after her back pain started. Her back pain began multiple months ago after moving into her new apartment. She is unable to provide an exact time fram. She saw [facility] PT however she reports that she was in so much pain that there was nothing they could do without worsening her pain and they called her and told her they couldn't help. Based on the referral it  states that the patient was too unsteady to be treated at their clinic. She reports that when her pain was really bad her legs were really shaky and were buckling. She was referred to pain management by Dr. Lavera Guise but still has not been contacted by their clinic. Denies history of any neck pain. She reports 4-5 falls in the last 6 months when her "back was really bothering her." PMH includes seizures, migraines, BPPV, and OA. Per medical record she has been seen multiple times at [place], and [facility] for this issue. She had an MRI which showed short pedicles of the lower lumbar spine and congenital canal narrowing with moderate canal stenosis at L3-4 level. She also has moderate bilateral foraminal stenosis at L4-5. Per patient she has an appointment with a spine specialist at [facility] next week.    Limitations  Walking    Diagnostic tests  Lumbar MRI: see history    Patient Stated Goals  Improve balance and back pain    Currently in Pain?  Yes    Pain Score  5     Pain Location  Back    Pain Orientation  Left    Pain Descriptors / Indicators  Sharp;Shooting    Pain Type  Chronic pain    Pain Onset  More than a month ago    Pain Frequency  Intermittent    Aggravating Factors   prolonged sitting (>20 min), prolonged walking/standing, 1/4 mile    Pain Relieving Factors  initial sitting, position changes;    Effect of Pain on Daily Activities  decreased activity tolerance;    Multiple Pain Sites  No         [facility] PT Assessment - 05/20/20 0001      Posture/Postural Control   Posture Comments  pt exhibits significant lumbar lordosis with pain reported along entire lumbar spine; Hooklying position helps relieve leg pain but increases back pain      AROM   Lumbar Flexion  30    Lumbar Extension  15    Lumbar - Right Side Bend  22    Lumbar - Left Side Bend  18      Palpation   Spinal mobility  very sensitive to touch, increased lordosis noted; reports severe pain with gentle lumbosacral  distraction      FABER test   findings  Positive    Side  --   right with pain anterior thigh     Slump test   Findings  Positive    Side  Left      Prone Knee Bend Test   Findings  Positive    Side  Left      Straight Leg Raise   Findings  Positive    Side   Left        TREATMENT: PT assessed lumbar spine, see above;  Patient exhibits lumbar extension preference with reduction in back pain and radicular symptoms with prone position  Prone: Press up on elbow x10 reps Hamstring curl x10 reps each LE with min VCs to avoid painful ROM and to limit ROM to tolerance;  Seated: Educated patient in use of towel roll for better sitting posture  Standing with back to counter/bar height: lumbar extension x10 reps; Patient required min VCS for proper positioning and to avoid painful ROM;  Response to treatment: patient does report increase in back/LE pain with lumbar extension but reports no radiculopathy at rest; Educated patient in proper positioning. She was instructed to stop exercise if LE pain worsened.                     PT Education - 05/20/20 1024    Education Details  lumbar assessment, ROM/strength;    Person(s) Educated  Patient    Methods  Explanation;Verbal cues    Comprehension  Verbalized understanding;Returned demonstration;Verbal cues required;Need further instruction       PT Short Term Goals - 05/15/20 1358      PT SHORT TERM GOAL #1   Title  Pt will be independent with HEP for balance and low back pain in order to improve pain-free function at home and decrease risk for falls    Time  6    Period  Weeks    Status  New    Target Date  06/25/20        PT Long Term Goals - 05/15/20 1417      PT LONG TERM GOAL #1   Title  Pt will improve BERG by at least 3 points in order to demonstrate clinically significant improvement in balance.    Baseline  05/14/20: 51/56    Time  12    Period  Weeks    Status  New  Target Date  08/06/20       PT LONG TERM GOAL #2   Title  Pt will increase 10MWT by at least 0.13 m/s in order to demonstrate clinically significant improvement in community ambulation.    Baseline  05/14/20: Self-selected: 13.7s = 0.73 m/s; Fastest: 12.7s = 0.79 m/s    Time  12    Period  Weeks    Status  New    Target Date  08/06/20      PT LONG TERM GOAL #3   Title  Pt will improve ABC by at least 13% in order to demonstrate clinically significant improvement in balance confidence.    Baseline  05/14/20: 27.5%    Time  12    Period  Weeks    Status  New    Target Date  08/06/20            Plan - 05/20/20 1105    Clinical Impression Statement  Patient motivated and participated well within session. PT assessed lumbar ROM, she does test positive for lumbar nerve impingement especially on left side. She also exhibits decreased right lumbar paraspinals multifidi firing with LLE hip extension. Patient reports increased back pain with hooklying position and less pain with prone position. Instructed patient in lumbar extension ROM. She does report increased radicular symptoms with extension but then reports relief of symptoms at rest. She would benefit from skilled PT intervention to improve strength, ROM and reduce pain with ADLs    Personal Factors and Comorbidities  Comorbidity 3+;Time since onset of injury/illness/exacerbation;Fitness;Past/Current Experience    Comorbidities  obesity, seizures, chronic pelvic pain    Examination-Activity Limitations  Lift;Locomotion Level;Squat;Stairs;Stand    Examination-Participation Restrictions  Community Activity;Shop    Stability/Clinical Decision Making  Evolving/Moderate complexity    Rehab Potential  Fair    PT Frequency  2x / week    PT Duration  12 weeks    PT Treatment/Interventions  ADLs/Self Care Home Management;Aquatic Therapy;Biofeedback;Electrical Stimulation;Traction;Moist Heat;Gait training;Stair training;Functional mobility training;Therapeutic  activities;Therapeutic exercise;Balance training;Patient/family education;Cognitive remediation;Neuromuscular re-education;Manual techniques;Scar mobilization;Passive range of motion;Taping;Dry needling;Spinal Manipulations;Joint Manipulations;Canalith Repostioning;Cryotherapy;Iontophoresis 4mg /ml Dexamethasone;Ultrasound;DME Instruction;Vestibular    PT Next Visit Plan  Assess low back pain, update appropriate outcome measures/goals for LBP, initiate HEP for balance and LBP    PT Home Exercise Plan  None currently    Consulted and Agree with Plan of Care  Patient       Patient will benefit from skilled therapeutic intervention in order to improve the following deficits and impairments:  Decreased balance, Difficulty walking, Decreased strength, Obesity, Pain  Visit Diagnosis: Unsteadiness on feet  Difficulty in walking, not elsewhere classified  Chronic low back pain, unspecified back pain laterality, unspecified whether sciatica present     Problem List Patient Active Problem List   Diagnosis Date Noted  . Chronic pelvic pain in female 08/24/2018  . Status post bilateral salpingo-oophorectomy (BSO) 08/24/2018  . History of 3 cesarean sections 08/24/2018  . Vasomotor symptoms due to menopause 08/24/2018  . BMI 33.0-33.9,adult 08/24/2018  . Urinary urgency 08/24/2018  . Urinary frequency 08/24/2018    Arnaldo Heffron PT, DPT 05/20/2020, 11:07 AM  [place] MAIN [facility] SERVICES [address] [place], [place], [address] Phone: [phone]   Fax:  [phone]  Name: Charlise Bhandari MRN: [id number] Date of Birth: 03/16/1966

## 2020-05-22 ENCOUNTER — Ambulatory Visit: Payer: Medicare Other | Admitting: Physical Therapy

## 2020-05-22 ENCOUNTER — Encounter: Payer: Self-pay | Admitting: Physical Therapy

## 2020-05-22 ENCOUNTER — Other Ambulatory Visit: Payer: Self-pay

## 2020-05-22 DIAGNOSIS — M62838 Other muscle spasm: Secondary | ICD-10-CM

## 2020-05-22 DIAGNOSIS — R2681 Unsteadiness on feet: Secondary | ICD-10-CM

## 2020-05-22 DIAGNOSIS — M545 Low back pain, unspecified: Secondary | ICD-10-CM

## 2020-05-22 DIAGNOSIS — R262 Difficulty in walking, not elsewhere classified: Secondary | ICD-10-CM

## 2020-05-22 DIAGNOSIS — G8929 Other chronic pain: Secondary | ICD-10-CM | POA: Diagnosis not present

## 2020-05-22 NOTE — Therapy (Signed)
[place] MAIN [facility] SERVICES [address] [place], [place], [address] Phone: [phone]   Fax:  [phone]  Physical Therapy Treatment  Patient Details  Name: Kristen Hudson MRN: [id number] Date of Birth: 1966-12-25 Referring Provider (PT): Dr. Alba Destine   Encounter Date: 05/22/2020  PT End of Session - 05/22/20 1030    Visit Number  3    Number of Visits  25    Date for PT Re-Evaluation  08/06/20    Authorization Type  eval: 05/14/20    PT Start Time  1017    PT Stop Time  1100    PT Time Calculation (min)  43 min    Equipment Utilized During Treatment  Gait belt    Activity Tolerance  Patient tolerated treatment well    Behavior During Therapy  [facility] for tasks assessed/performed       Past Medical History:  Diagnosis Date  . Acid reflux   . Seizures ([place])     Past Surgical History:  Procedure Laterality Date  . APPENDECTOMY    . CESAREAN SECTION    . CHOLECYSTECTOMY    . COLON RESECTION    . OVARY SURGERY      There were no vitals filed for this visit.  Subjective Assessment - 05/22/20 1029    Subjective  Patient reports feeling soreness in back at start of session. She reports having shakiness in her left leg after last session but that was relieved after 5 min which is an improvement;    Pertinent History  Pt referred to PT for unsteadiness as well as L sided low back pain. Pt reports that her balance has been an issue for years but worsened after her back pain started. Her back pain began multiple months ago after moving into her new apartment. She is unable to provide an exact time fram. She saw [facility] PT however she reports that she was in so much pain that there was nothing they could do without worsening her pain and they called her and told her they couldn't help. Based on the referral it states that the patient was too unsteady to be treated at their clinic. She reports that when her pain was really bad her legs were  really shaky and were buckling. She was referred to pain management by Dr. Lavera Guise but still has not been contacted by their clinic. Denies history of any neck pain. She reports 4-5 falls in the last 6 months when her "back was really bothering her." PMH includes seizures, migraines, BPPV, and OA. Per medical record she has been seen multiple times at [place], and [facility] for this issue. She had an MRI which showed short pedicles of the lower lumbar spine and congenital canal narrowing with moderate canal stenosis at L3-4 level. She also has moderate bilateral foraminal stenosis at L4-5. Per patient she has an appointment with a spine specialist at [facility] next week.    Limitations  Walking    Diagnostic tests  Lumbar MRI: see history    Patient Stated Goals  Improve balance and back pain    Currently in Pain?  Yes    Pain Score  8     Pain Location  Back    Pain Orientation  Left;Lower    Pain Descriptors / Indicators  Aching;Sharp;Sore    Pain Type  Chronic pain    Pain Onset  More than a month ago    Pain Frequency  Intermittent  Aggravating Factors   prolonged sitting/standing    Pain Relieving Factors  initial sitting, position change; temporary relief from heat    Effect of Pain on Daily Activities  decreased activity tolerance;    Multiple Pain Sites  No       TREATMENT: Patient prone: PT applied TENs, modulated setting at tolerated intensity #2-3, concurrent with moist heat x15 min; Patient tolerated well reporting a reduction in back pain with TENs unit; she is sensitive to touch being able to only tolerate mild setting;  Prone: Alternate hamstring curl 2x10 reps each LE with min VCs to avoid trunk rotation and avoid painful ROM  Prone press up on elbow x10 reps with cues for proper positioning including to avoid gluteal squeeze and to increase push through UE;   Prone hip extension x10 reps each LE with partial ROM to avoid discomfort;  Prone on elbows positioning during education  of recent MRI results x5 min;  Following exercise, patient transition to sitting and finished with Nustep BUE/BLE level 2 x4 min (unbilled);  Patient reports reduction in back pain to 5/10 with minimal LLE radicular pain at end of session Reinforced HEP                        PT Education - 05/22/20 1030    Education Details  TENs, lumbar ROM    Person(s) Educated  Patient    Methods  Explanation;Verbal cues    Comprehension  Verbalized understanding;Returned demonstration;Verbal cues required;Need further instruction       PT Short Term Goals - 05/15/20 1358      PT SHORT TERM GOAL #1   Title  Pt will be independent with HEP for balance and low back pain in order to improve pain-free function at home and decrease risk for falls    Time  6    Period  Weeks    Status  New    Target Date  06/25/20        PT Long Term Goals - 05/15/20 1417      PT LONG TERM GOAL #1   Title  Pt will improve BERG by at least 3 points in order to demonstrate clinically significant improvement in balance.    Baseline  05/14/20: 51/56    Time  12    Period  Weeks    Status  New    Target Date  08/06/20      PT LONG TERM GOAL #2   Title  Pt will increase 10MWT by at least 0.13 m/s in order to demonstrate clinically significant improvement in community ambulation.    Baseline  05/14/20: Self-selected: 13.7s = 0.73 m/s; Fastest: 12.7s = 0.79 m/s    Time  12    Period  Weeks    Status  New    Target Date  08/06/20      PT LONG TERM GOAL #3   Title  Pt will improve ABC by at least 13% in order to demonstrate clinically significant improvement in balance confidence.    Baseline  05/14/20: 27.5%    Time  12    Period  Weeks    Status  New    Target Date  08/06/20            Plan - 05/22/20 1031    Clinical Impression Statement  patient motivated and participated well within session; She reports reduced back pain with TENs unit although is only able to tolerate mild  intensity due to sensitivity to touch; Instructed patient in prone lumbar extension exercise. She does require min VCs for proper positioning and exercise technique; She was able to exhibit good positioning without trunk rotation with LE movement. Patient is limited in ROM against gravity in prone position due to weakness; She does report mild tingling in press ups but denies any burning pain down LLE;    Personal Factors and Comorbidities  Comorbidity 3+;Time since onset of injury/illness/exacerbation;Fitness;Past/Current Experience    Comorbidities  obesity, seizures, chronic pelvic pain    Examination-Activity Limitations  Lift;Locomotion Level;Squat;Stairs;Stand    Examination-Participation Restrictions  Community Activity;Shop    Stability/Clinical Decision Making  Evolving/Moderate complexity    Rehab Potential  Fair    PT Frequency  2x / week    PT Duration  12 weeks    PT Treatment/Interventions  ADLs/Self Care Home Management;Aquatic Therapy;Biofeedback;Electrical Stimulation;Traction;Moist Heat;Gait training;Stair training;Functional mobility training;Therapeutic activities;Therapeutic exercise;Balance training;Patient/family education;Cognitive remediation;Neuromuscular re-education;Manual techniques;Scar mobilization;Passive range of motion;Taping;Dry needling;Spinal Manipulations;Joint Manipulations;Canalith Repostioning;Cryotherapy;Iontophoresis 4mg /ml Dexamethasone;Ultrasound;DME Instruction;Vestibular    PT Next Visit Plan  Assess low back pain, update appropriate outcome measures/goals for LBP, initiate HEP for balance and LBP    PT Home Exercise Plan  None currently    Consulted and Agree with Plan of Care  Patient       Patient will benefit from skilled therapeutic intervention in order to improve the following deficits and impairments:  Decreased balance, Difficulty walking, Decreased strength, Obesity, Pain  Visit Diagnosis: Unsteadiness on feet  Difficulty in walking, not  elsewhere classified  Chronic low back pain, unspecified back pain laterality, unspecified whether sciatica present  Other muscle spasm     Problem List Patient Active Problem List   Diagnosis Date Noted  . Chronic pelvic pain in female 08/24/2018  . Status post bilateral salpingo-oophorectomy (BSO) 08/24/2018  . History of 3 cesarean sections 08/24/2018  . Vasomotor symptoms due to menopause 08/24/2018  . BMI 33.0-33.9,adult 08/24/2018  . Urinary urgency 08/24/2018  . Urinary frequency 08/24/2018    Disney Ruggiero PT, DPT 05/22/2020, 10:42 AM  [place] MAIN [facility] SERVICES [address] [place], [place], [address] Phone: [phone]   Fax:  [phone]  Name: Kristen Hudson MRN: [id number] Date of Birth: 06-Jul-1966

## 2020-05-29 ENCOUNTER — Ambulatory Visit: Payer: Medicare Other | Attending: Physical Medicine & Rehabilitation | Admitting: Physical Therapy

## 2020-05-29 ENCOUNTER — Other Ambulatory Visit: Payer: Self-pay

## 2020-05-29 ENCOUNTER — Encounter: Payer: Self-pay | Admitting: Physical Therapy

## 2020-05-29 DIAGNOSIS — R2681 Unsteadiness on feet: Secondary | ICD-10-CM | POA: Diagnosis not present

## 2020-05-29 DIAGNOSIS — M545 Low back pain, unspecified: Secondary | ICD-10-CM

## 2020-05-29 DIAGNOSIS — G8929 Other chronic pain: Secondary | ICD-10-CM | POA: Diagnosis not present

## 2020-05-29 DIAGNOSIS — R262 Difficulty in walking, not elsewhere classified: Secondary | ICD-10-CM | POA: Insufficient documentation

## 2020-05-29 DIAGNOSIS — M62838 Other muscle spasm: Secondary | ICD-10-CM | POA: Insufficient documentation

## 2020-05-29 NOTE — Therapy (Signed)
[place] MAIN [facility] SERVICES [address] [place], [place], [address] Phone: [phone]   Fax:  [phone]  Physical Therapy Treatment  Patient Details  Name: Kristen Hudson MRN: [id number] Date of Birth: 1966/03/09 Referring Provider (PT): Dr. Alba Destine   Encounter Date: 05/29/2020  PT End of Session - 05/29/20 1019    Visit Number  4    Number of Visits  25    Date for PT Re-Evaluation  08/06/20    Authorization Type  eval: 05/14/20    PT Start Time  1017    PT Stop Time  1100    PT Time Calculation (min)  43 min    Equipment Utilized During Treatment  Gait belt    Activity Tolerance  Patient tolerated treatment well    Behavior During Therapy  [facility] for tasks assessed/performed       Past Medical History:  Diagnosis Date  . Acid reflux   . Seizures ([place])     Past Surgical History:  Procedure Laterality Date  . APPENDECTOMY    . CESAREAN SECTION    . CHOLECYSTECTOMY    . COLON RESECTION    . OVARY SURGERY      There were no vitals filed for this visit.  Subjective Assessment - 05/29/20 1027    Subjective  Patient reports feeling increased back pain today; She reports her LLE pain is improving wiht pain only going to posterior calf and being less duration/frequency; She reports when doing HEP she has increased back pain but states it is helping her left leg pain. She does report having trouble walking over the weekend;    Pertinent History  Pt referred to PT for unsteadiness as well as L sided low back pain. Pt reports that her balance has been an issue for years but worsened after her back pain started. Her back pain began multiple months ago after moving into her new apartment. She is unable to provide an exact time fram. She saw [facility] PT however she reports that she was in so much pain that there was nothing they could do without worsening her pain and they called her and told her they couldn't help. Based on the referral  it states that the patient was too unsteady to be treated at their clinic. She reports that when her pain was really bad her legs were really shaky and were buckling. She was referred to pain management by Dr. Lavera Guise but still has not been contacted by their clinic. Denies history of any neck pain. She reports 4-5 falls in the last 6 months when her "back was really bothering her." PMH includes seizures, migraines, BPPV, and OA. Per medical record she has been seen multiple times at [place], and [facility] for this issue. She had an MRI which showed short pedicles of the lower lumbar spine and congenital canal narrowing with moderate canal stenosis at L3-4 level. She also has moderate bilateral foraminal stenosis at L4-5. Per patient she has an appointment with a spine specialist at [facility] next week.    Limitations  Walking    Diagnostic tests  Lumbar MRI: see history    Patient Stated Goals  Improve balance and back pain    Currently in Pain?  Yes    Pain Score  8     Pain Location  Back    Pain Orientation  Lower;Left    Pain Descriptors / Indicators  Aching;Sharp;Sore    Pain Type  Chronic pain  Pain Onset  More than a month ago    Pain Frequency  Intermittent    Aggravating Factors   prolonged sitting/standing    Pain Relieving Factors  initial sitting; position change; temporary relief from heat;    Effect of Pain on Daily Activities  decreased activity tolerance;    Multiple Pain Sites  No            TREATMENT: Patient prone: PT applied TENs, modulated setting at tolerated intensity #2-4, concurrent with moist heat x15 min; Patient tolerated well reporting a reduction in back pain with TENs unit; she is sensitive to touch being able to only tolerate mild setting;  Prone: Alternate hamstring curl 2x10 reps each LE with min VCs to avoid trunk rotation and avoid painful ROM  Prone press up on elbow 2x10 reps with cues for proper positioning including to avoid gluteal squeeze and to  increase push through UE;   Prone hip extension x10 reps each LE with partial ROM to avoid discomfort;  Qped: Cat/camel stretch 5 sec hold x5 reps; with mod VCs for proper positioning and exercise technique including to increase lumbopelvic activation;  Patient does have increased pain in low back with advanced exercise; She was able to exhibit improved ROM with increased repetition;   Patient reports reduction in LLE pain to 2/10 with minimal discomfort reported; She does however report increased back pain with guarding and heavy breathing with all movement; Reinforced HEP                      PT Education - 05/29/20 1018    Education Details  TENs, lumbar ROM/strengthening;HEP    Person(s) Educated  Patient    Methods  Explanation;Verbal cues    Comprehension  Verbalized understanding;Returned demonstration;Verbal cues required;Need further instruction       PT Short Term Goals - 05/15/20 1358      PT SHORT TERM GOAL #1   Title  Pt will be independent with HEP for balance and low back pain in order to improve pain-free function at home and decrease risk for falls    Time  6    Period  Weeks    Status  New    Target Date  06/25/20        PT Long Term Goals - 05/15/20 1417      PT LONG TERM GOAL #1   Title  Pt will improve BERG by at least 3 points in order to demonstrate clinically significant improvement in balance.    Baseline  05/14/20: 51/56    Time  12    Period  Weeks    Status  New    Target Date  08/06/20      PT LONG TERM GOAL #2   Title  Pt will increase 10MWT by at least 0.13 m/s in order to demonstrate clinically significant improvement in community ambulation.    Baseline  05/14/20: Self-selected: 13.7s = 0.73 m/s; Fastest: 12.7s = 0.79 m/s    Time  12    Period  Weeks    Status  New    Target Date  08/06/20      PT LONG TERM GOAL #3   Title  Pt will improve ABC by at least 13% in order to demonstrate clinically significant  improvement in balance confidence.    Baseline  05/14/20: 27.5%    Time  12    Period  Weeks    Status  New  Target Date  08/06/20            Plan - 05/29/20 1039    Clinical Impression Statement  Patient presents to therapy with increased back pain. PT applied TENs for pain control. She has increased sensitivity to touch being able to only tolerate light intensity. Patient is able to exhibit better ROM with increased exercise with improved flexibility; She does report less LLE pain at end of session but does have increased low back pain; She would benefit from additional skilled PT intervention to improve strength and reduce back pain with ADLs.    Personal Factors and Comorbidities  Comorbidity 3+;Time since onset of injury/illness/exacerbation;Fitness;Past/Current Experience    Comorbidities  obesity, seizures, chronic pelvic pain    Examination-Activity Limitations  Lift;Locomotion Level;Squat;Stairs;Stand    Examination-Participation Restrictions  Community Activity;Shop    Stability/Clinical Decision Making  Evolving/Moderate complexity    Rehab Potential  Fair    PT Frequency  2x / week    PT Duration  12 weeks    PT Treatment/Interventions  ADLs/Self Care Home Management;Aquatic Therapy;Biofeedback;Electrical Stimulation;Traction;Moist Heat;Gait training;Stair training;Functional mobility training;Therapeutic activities;Therapeutic exercise;Balance training;Patient/family education;Cognitive remediation;Neuromuscular re-education;Manual techniques;Scar mobilization;Passive range of motion;Taping;Dry needling;Spinal Manipulations;Joint Manipulations;Canalith Repostioning;Cryotherapy;Iontophoresis 4mg /ml Dexamethasone;Ultrasound;DME Instruction;Vestibular    PT Next Visit Plan  Assess low back pain, update appropriate outcome measures/goals for LBP, initiate HEP for balance and LBP    PT Home Exercise Plan  None currently    Consulted and Agree with Plan of Care  Patient        Patient will benefit from skilled therapeutic intervention in order to improve the following deficits and impairments:  Decreased balance, Difficulty walking, Decreased strength, Obesity, Pain  Visit Diagnosis: Unsteadiness on feet  Difficulty in walking, not elsewhere classified  Chronic low back pain, unspecified back pain laterality, unspecified whether sciatica present  Other muscle spasm     Problem List Patient Active Problem List   Diagnosis Date Noted  . Chronic pelvic pain in female 08/24/2018  . Status post bilateral salpingo-oophorectomy (BSO) 08/24/2018  . History of 3 cesarean sections 08/24/2018  . Vasomotor symptoms due to menopause 08/24/2018  . BMI 33.0-33.9,adult 08/24/2018  . Urinary urgency 08/24/2018  . Urinary frequency 08/24/2018    Todd Argabright PT, DPT 05/29/2020, 11:04 AM  [place] MAIN [facility] SERVICES [address] [place], [place], [address] Phone: [phone]   Fax:  [phone]  Name: Kristen Hudson MRN: [id number] Date of Birth: September 09, 1966

## 2020-06-03 ENCOUNTER — Ambulatory Visit: Payer: Medicare Other | Admitting: Physical Therapy

## 2020-06-05 ENCOUNTER — Ambulatory Visit: Payer: Medicare Other | Admitting: Physical Therapy

## 2020-06-12 ENCOUNTER — Ambulatory Visit: Payer: Medicare Other | Admitting: Physical Therapy

## 2020-06-12 ENCOUNTER — Other Ambulatory Visit: Payer: Self-pay

## 2020-06-12 ENCOUNTER — Encounter: Payer: Self-pay | Admitting: Physical Therapy

## 2020-06-12 DIAGNOSIS — G8929 Other chronic pain: Secondary | ICD-10-CM | POA: Diagnosis not present

## 2020-06-12 DIAGNOSIS — M545 Low back pain: Secondary | ICD-10-CM | POA: Diagnosis not present

## 2020-06-12 DIAGNOSIS — R262 Difficulty in walking, not elsewhere classified: Secondary | ICD-10-CM

## 2020-06-12 DIAGNOSIS — R2681 Unsteadiness on feet: Secondary | ICD-10-CM | POA: Diagnosis not present

## 2020-06-12 DIAGNOSIS — M62838 Other muscle spasm: Secondary | ICD-10-CM

## 2020-06-12 NOTE — Therapy (Signed)
[place] MAIN [facility] SERVICES [address] [place], [place], [address] Phone: [phone]   Fax:  [phone]  Physical Therapy Treatment  Patient Details  Name: Kristen Hudson MRN: [id number] Date of Birth: June 17, 1966 Referring Provider (PT): Dr. Alba Destine   Encounter Date: 06/12/2020   PT End of Session - 06/12/20 1005    Visit Number 5    Number of Visits 25    Date for PT Re-Evaluation 08/06/20    Authorization Type eval: 05/14/20    PT Start Time 1002    PT Stop Time 1045    PT Time Calculation (min) 43 min    Equipment Utilized During Treatment Gait belt    Activity Tolerance Patient tolerated treatment well    Behavior During Therapy [facility] for tasks assessed/performed           Past Medical History:  Diagnosis Date   Acid reflux    Seizures ([place])     Past Surgical History:  Procedure Laterality Date   APPENDECTOMY     CESAREAN SECTION     CHOLECYSTECTOMY     COLON RESECTION     OVARY SURGERY      There were no vitals filed for this visit.   Subjective Assessment - 06/12/20 1003    Subjective Patient reports her back pain is , "Doing okay." She reports watching her grandkids last week. She did try moving a coffee table which did increase her pain but she did her exercises and they did help relieve her discomfort. She reports having increased back pain this morning but states right now its good. She reports having intermittent LLE leg pain;    Pertinent History Pt referred to PT for unsteadiness as well as L sided low back pain. Pt reports that her balance has been an issue for years but worsened after her back pain started. Her back pain began multiple months ago after moving into her new apartment. She is unable to provide an exact time fram. She saw [facility] PT however she reports that she was in so much pain that there was nothing they could do without worsening her pain and they called her and told her they  couldn't help. Based on the referral it states that the patient was too unsteady to be treated at their clinic. She reports that when her pain was really bad her legs were really shaky and were buckling. She was referred to pain management by Dr. Lavera Guise but still has not been contacted by their clinic. Denies history of any neck pain. She reports 4-5 falls in the last 6 months when her "back was really bothering her." PMH includes seizures, migraines, BPPV, and OA. Per medical record she has been seen multiple times at [place], and [facility] for this issue. She had an MRI which showed short pedicles of the lower lumbar spine and congenital canal narrowing with moderate canal stenosis at L3-4 level. She also has moderate bilateral foraminal stenosis at L4-5. Per patient she has an appointment with a spine specialist at [facility] next week.    Limitations Walking    Diagnostic tests Lumbar MRI: see history    Patient Stated Goals Improve balance and back pain    Currently in Pain? No/denies    Pain Score 0-No pain    Pain Onset More than a month ago              TREATMENT:  Hooklying with moist heat to low back: -lumbar trunk  rotation 2x10 reps each direction -single knee to chest stretch 20 sec hold x2 reps each LE; -Hamstring stretch SLR with ankle DF/PF neural flossing x10 reps each LE;  -posterior pelvic rock 5 sec hold 2x10 reps; Patient required min-moderate verbal/tactile cues for correct exercise technique for optimal muscle activation and postural control;   Hooklying: With posterior pelvic rock: -alternate march x10 reps; -bridges with arms by side 2x10 reps;  Sidelying: Passive quad stretch 20 sec hold x2 reps bilaterally;  Red tband clamshells 2x10 with min VCs for proper positioning for optimal strengthening;   Patient tolerated exercise well. With increased repetition/exercise, patient does report reduction in tingling in LLE foot. She also reports less stiffness in low back; She  does exhibit increased difficulty lifting LLE due to weakness;               PT Education - 06/12/20 1005    Education Details TENs, lumbar ROM/strengthening, HEP    Person(s) Educated Patient    Methods Explanation;Verbal cues    Comprehension Returned demonstration;Verbalized understanding;Verbal cues required;Need further instruction            PT Short Term Goals - 05/15/20 1358      PT SHORT TERM GOAL #1   Title Pt will be independent with HEP for balance and low back pain in order to improve pain-free function at home and decrease risk for falls    Time 6    Period Weeks    Status New    Target Date 06/25/20             PT Long Term Goals - 05/15/20 1417      PT LONG TERM GOAL #1   Title Pt will improve BERG by at least 3 points in order to demonstrate clinically significant improvement in balance.    Baseline 05/14/20: 51/56    Time 12    Period Weeks    Status New    Target Date 08/06/20      PT LONG TERM GOAL #2   Title Pt will increase 10MWT by at least 0.13 m/s in order to demonstrate clinically significant improvement in community ambulation.    Baseline 05/14/20: Self-selected: 13.7s = 0.73 m/s; Fastest: 12.7s = 0.79 m/s    Time 12    Period Weeks    Status New    Target Date 08/06/20      PT LONG TERM GOAL #3   Title Pt will improve ABC by at least 13% in order to demonstrate clinically significant improvement in balance confidence.    Baseline 05/14/20: 27.5%    Time 12    Period Weeks    Status New    Target Date 08/06/20                 Plan - 06/12/20 1019    Clinical Impression Statement Patient presents to therapy without back pain. She does report intermittent LLE radiculopathy with increased tingling in foot. Patient instructed in lumbar flexion/core strengthening exercise. She does require min VCs for proper exercise technique/positioning. With increased repetition, she was able to exhibit better flexiblity and reports less  tingling down LLE. Patient instructed in HEP. She would benefit from additional skilled PT Intervention to improve strength and mobility while reducing back pain;    Personal Factors and Comorbidities Comorbidity 3+;Time since onset of injury/illness/exacerbation;Fitness;Past/Current Experience    Comorbidities obesity, seizures, chronic pelvic pain    Examination-Activity Limitations Lift;Locomotion Level;Squat;Stairs;Stand    Examination-Participation Restrictions Community Activity;Shop  Stability/Clinical Decision Making Evolving/Moderate complexity    Rehab Potential Fair    PT Frequency 2x / week    PT Duration 12 weeks    PT Treatment/Interventions ADLs/Self Care Home Management;Aquatic Therapy;Biofeedback;Electrical Stimulation;Traction;Moist Heat;Gait training;Stair training;Functional mobility training;Therapeutic activities;Therapeutic exercise;Balance training;Patient/family education;Cognitive remediation;Neuromuscular re-education;Manual techniques;Scar mobilization;Passive range of motion;Taping;Dry needling;Spinal Manipulations;Joint Manipulations;Canalith Repostioning;Cryotherapy;Iontophoresis 4mg /ml Dexamethasone;Ultrasound;DME Instruction;Vestibular    PT Next Visit Plan Assess low back pain, update appropriate outcome measures/goals for LBP, initiate HEP for balance and LBP    PT Home Exercise Plan None currently    Consulted and Agree with Plan of Care Patient           Patient will benefit from skilled therapeutic intervention in order to improve the following deficits and impairments:  Decreased balance, Difficulty walking, Decreased strength, Obesity, Pain  Visit Diagnosis: Unsteadiness on feet  Difficulty in walking, not elsewhere classified  Chronic low back pain, unspecified back pain laterality, unspecified whether sciatica present  Other muscle spasm     Problem List Patient Active Problem List   Diagnosis Date Noted   Chronic pelvic pain in female  08/24/2018   Status post bilateral salpingo-oophorectomy (BSO) 08/24/2018   History of 3 cesarean sections 08/24/2018   Vasomotor symptoms due to menopause 08/24/2018   BMI 33.0-33.9,adult 08/24/2018   Urinary urgency 08/24/2018   Urinary frequency 08/24/2018    Oliviarose Punch PT, DPT 06/12/2020, 10:52 AM  [place] [address] [place], [place], [address] Phone: [phone]   Fax:  [phone]  Name: Kristen Hudson MRN: [id number] Date of Birth: 05-23-1966

## 2020-06-12 NOTE — Patient Instructions (Signed)
Pelvic Tilt  Lying on back with knees bent, Flatten back by tightening stomach muscles and rocking hips back Hold for 5 sec, Repeat __10__ times per set. Do __1__ sets per session. Do __2__ sessions per day.  http://orth.exer.us/134    Copyright  VHI. All rights reserved. Knee to Chest (Flexion)   Pull knee toward chest. Feel stretch in lower back or buttock area. Breathing deeply, Hold __15__ seconds. Repeat with other knee. Repeat _2-3___ times. Do _2-3___ sessions per day.  http://gt2.exer.us/225   Copyright  VHI. All rights reserved.   Lower Trunk Rotation Stretch  Lying on back with knees bent, Keeping back flat and feet together, rotate knees side to side slowly and in pain free range of motion.  Hold _2___ seconds. Repeat for 1-2 minutes. Do __1__ sets per session. Do __2-3__ sessions per day.  http://orth.exer.us/122   Copyright  VHI. All rights reserved.   Bridge   Lie back, legs bent. Inhale, pressing hips up.  Exhale, rolling down along spine from top. Repeat _10___ times. Do __1-2__ sessions per day.  Copyright  VHI. All rights reserved.

## 2020-06-17 ENCOUNTER — Ambulatory Visit: Payer: Medicare Other | Admitting: Physical Therapy

## 2020-06-19 ENCOUNTER — Other Ambulatory Visit: Payer: Self-pay

## 2020-06-19 ENCOUNTER — Ambulatory Visit: Payer: Medicare Other | Admitting: Physical Therapy

## 2020-06-19 ENCOUNTER — Encounter: Payer: Self-pay | Admitting: Physical Therapy

## 2020-06-19 DIAGNOSIS — G8929 Other chronic pain: Secondary | ICD-10-CM

## 2020-06-19 DIAGNOSIS — M62838 Other muscle spasm: Secondary | ICD-10-CM | POA: Diagnosis not present

## 2020-06-19 DIAGNOSIS — R262 Difficulty in walking, not elsewhere classified: Secondary | ICD-10-CM

## 2020-06-19 DIAGNOSIS — M545 Low back pain: Secondary | ICD-10-CM | POA: Diagnosis not present

## 2020-06-19 DIAGNOSIS — R2681 Unsteadiness on feet: Secondary | ICD-10-CM | POA: Diagnosis not present

## 2020-06-19 NOTE — Therapy (Signed)
[place] MAIN [facility] SERVICES [address] [place], [place], [address] Phone: [phone]   Fax:  [phone]  Physical Therapy Treatment  Patient Details  Name: Kristen Hudson MRN: [id number] Date of Birth: 1966-09-15 Referring Provider (PT): Dr. Alba Destine   Encounter Date: 06/19/2020   PT End of Session - 06/19/20 1032    Visit Number 6    Number of Visits 25    Date for PT Re-Evaluation 08/06/20    Authorization Type eval: 05/14/20    PT Start Time 1018    PT Stop Time 1100    PT Time Calculation (min) 42 min    Equipment Utilized During Treatment Gait belt    Activity Tolerance Patient tolerated treatment well    Behavior During Therapy [facility] for tasks assessed/performed           Past Medical History:  Diagnosis Date  . Acid reflux   . Seizures ([place])     Past Surgical History:  Procedure Laterality Date  . APPENDECTOMY    . CESAREAN SECTION    . CHOLECYSTECTOMY    . COLON RESECTION    . OVARY SURGERY      There were no vitals filed for this visit.   Subjective Assessment - 06/19/20 1029    Subjective Patient reports increased back pain today. She reports that she has been trying the exercise and they aren't making it worse but they aren't helping;    Pertinent History Pt referred to PT for unsteadiness as well as L sided low back pain. Pt reports that her balance has been an issue for years but worsened after her back pain started. Her back pain began multiple months ago after moving into her new apartment. She is unable to provide an exact time fram. She saw [facility] PT however she reports that she was in so much pain that there was nothing they could do without worsening her pain and they called her and told her they couldn't help. Based on the referral it states that the patient was too unsteady to be treated at their clinic. She reports that when her pain was really bad her legs were really shaky and were buckling. She  was referred to pain management by Dr. Lavera Guise but still has not been contacted by their clinic. Denies history of any neck pain. She reports 4-5 falls in the last 6 months when her "back was really bothering her." PMH includes seizures, migraines, BPPV, and OA. Per medical record she has been seen multiple times at [place], and [facility] for this issue. She had an MRI which showed short pedicles of the lower lumbar spine and congenital canal narrowing with moderate canal stenosis at L3-4 level. She also has moderate bilateral foraminal stenosis at L4-5. Per patient she has an appointment with a spine specialist at [facility] next week.    Limitations Walking    Diagnostic tests Lumbar MRI: see history    Patient Stated Goals Improve balance and back pain    Currently in Pain? Yes    Pain Score 10-Worst pain ever    Pain Location Back    Pain Orientation Lower;Left    Pain Descriptors / Indicators Aching;Sharp;Sore    Pain Type Chronic pain    Pain Onset More than a month ago    Pain Frequency Intermittent    Aggravating Factors  prolonged sitting/standing    Pain Relieving Factors initial standing, position change, temporary relief from heat    Effect  of Pain on Daily Activities decreased activity tolerance;    Multiple Pain Sites No               TREATMENT: Patient presents with severe back pain  PT applied TENs to low back concurrent with moist heat x15 min at tolerated intensity modulated setting, intensity #3-4  While on TENs patient educated about safe exercise to do at home including aquatic and proper positioning. Patient instructed to avoid prolonged sitting to reduce compression to spine but rather to get up and walk around every 20-30 min or to lay down;   Following TENs:  PT applied lumbar mechanical traction in hooklying: 50# on, 30# off, 20 sec on, 10 sec off, after 5 min patient requested to stop machine due to increase in back pain;  She required min A to transition to  sitting. Patient unable to tolerate TENs  She denies any increase pain in LE but reports severe back pain; Despite severe back pain, patient able to walk out of gym with functional gait pattern (no antalgic gait noted)                       PT Education - 06/19/20 1032    Education Details TENs/traction, HEP    Person(s) Educated Patient    Methods Explanation;Verbal cues    Comprehension Returned demonstration;Verbal cues required;Verbalized understanding;Need further instruction            PT Short Term Goals - 05/15/20 1358      PT SHORT TERM GOAL #1   Title Pt will be independent with HEP for balance and low back pain in order to improve pain-free function at home and decrease risk for falls    Time 6    Period Weeks    Status New    Target Date 06/25/20             PT Long Term Goals - 05/15/20 1417      PT LONG TERM GOAL #1   Title Pt will improve BERG by at least 3 points in order to demonstrate clinically significant improvement in balance.    Baseline 05/14/20: 51/56    Time 12    Period Weeks    Status New    Target Date 08/06/20      PT LONG TERM GOAL #2   Title Pt will increase 10MWT by at least 0.13 m/s in order to demonstrate clinically significant improvement in community ambulation.    Baseline 05/14/20: Self-selected: 13.7s = 0.73 m/s; Fastest: 12.7s = 0.79 m/s    Time 12    Period Weeks    Status New    Target Date 08/06/20      PT LONG TERM GOAL #3   Title Pt will improve ABC by at least 13% in order to demonstrate clinically significant improvement in balance confidence.    Baseline 05/14/20: 27.5%    Time 12    Period Weeks    Status New    Target Date 08/06/20                 Plan - 06/19/20 1238    Clinical Impression Statement patient presents to therapy with increased back pain. She admits frustration on back not being any better.PT applied TENs for pain relief. She does reports slight improvement with TENS but  states this is temporary. PT applied lumbar mechanical traction. Patient unable to tolerate more than 5 min reporting increased back pain. Patient continues  to have high levels of back pain. She is very sensitive to touch and therefore is not appropriate for manual therapy. educated patient in ways to exercise in pool for better tolerance. She would benefit from additional skilled PT intervention to improve strength and mobility and reduce back pain;    Personal Factors and Comorbidities Comorbidity 3+;Time since onset of injury/illness/exacerbation;Fitness;Past/Current Experience    Comorbidities obesity, seizures, chronic pelvic pain    Examination-Activity Limitations Lift;Locomotion Level;Squat;Stairs;Stand    Examination-Participation Restrictions Community Activity;Shop    Stability/Clinical Decision Making Evolving/Moderate complexity    Rehab Potential Fair    PT Frequency 2x / week    PT Duration 12 weeks    PT Treatment/Interventions ADLs/Self Care Home Management;Aquatic Therapy;Biofeedback;Electrical Stimulation;Traction;Moist Heat;Gait training;Stair training;Functional mobility training;Therapeutic activities;Therapeutic exercise;Balance training;Patient/family education;Cognitive remediation;Neuromuscular re-education;Manual techniques;Scar mobilization;Passive range of motion;Taping;Dry needling;Spinal Manipulations;Joint Manipulations;Canalith Repostioning;Cryotherapy;Iontophoresis 4mg /ml Dexamethasone;Ultrasound;DME Instruction;Vestibular    PT Next Visit Plan Assess low back pain, update appropriate outcome measures/goals for LBP, initiate HEP for balance and LBP    PT Home Exercise Plan None currently    Consulted and Agree with Plan of Care Patient           Patient will benefit from skilled therapeutic intervention in order to improve the following deficits and impairments:  Decreased balance, Difficulty walking, Decreased strength, Obesity, Pain  Visit  Diagnosis: Unsteadiness on feet  Difficulty in walking, not elsewhere classified  Chronic low back pain, unspecified back pain laterality, unspecified whether sciatica present  Other muscle spasm     Problem List Patient Active Problem List   Diagnosis Date Noted  . Chronic pelvic pain in female 08/24/2018  . Status post bilateral salpingo-oophorectomy (BSO) 08/24/2018  . History of 3 cesarean sections 08/24/2018  . Vasomotor symptoms due to menopause 08/24/2018  . BMI 33.0-33.9,adult 08/24/2018  . Urinary urgency 08/24/2018  . Urinary frequency 08/24/2018    Ricky Gallery PT, DPT 06/19/2020, 12:46 PM  [place] MAIN [facility] SERVICES [address] [place], [place], [address] Phone: [phone]   Fax:  [phone]  Name: Janelle Spellman MRN: [id number] Date of Birth: 1966-11-07

## 2020-06-24 ENCOUNTER — Encounter: Payer: Self-pay | Admitting: Physical Therapy

## 2020-06-24 ENCOUNTER — Ambulatory Visit: Payer: Medicare Other | Admitting: Physical Therapy

## 2020-06-24 ENCOUNTER — Other Ambulatory Visit: Payer: Self-pay

## 2020-06-24 DIAGNOSIS — R262 Difficulty in walking, not elsewhere classified: Secondary | ICD-10-CM | POA: Diagnosis not present

## 2020-06-24 DIAGNOSIS — R2681 Unsteadiness on feet: Secondary | ICD-10-CM | POA: Diagnosis not present

## 2020-06-24 DIAGNOSIS — G8929 Other chronic pain: Secondary | ICD-10-CM | POA: Diagnosis not present

## 2020-06-24 DIAGNOSIS — M62838 Other muscle spasm: Secondary | ICD-10-CM | POA: Diagnosis not present

## 2020-06-24 DIAGNOSIS — M545 Low back pain, unspecified: Secondary | ICD-10-CM

## 2020-06-24 NOTE — Therapy (Signed)
[place] MAIN [facility] SERVICES [address] [place], [place], [address] Phone: [phone]   Fax:  [phone]  Physical Therapy Treatment  Patient Details  Name: Kristen Hudson MRN: [id number] Date of Birth: 09-30-1966 Referring Provider (PT): Dr. Alba Destine   Encounter Date: 06/24/2020   PT End of Session - 06/24/20 1032    Visit Number 7    Number of Visits 25    Date for PT Re-Evaluation 08/06/20    Authorization Type eval: 05/14/20    PT Start Time 1018    PT Stop Time 1100    PT Time Calculation (min) 42 min    Equipment Utilized During Treatment Gait belt    Activity Tolerance Patient tolerated treatment well    Behavior During Therapy [facility] for tasks assessed/performed           Past Medical History:  Diagnosis Date  . Acid reflux   . Seizures ([place])     Past Surgical History:  Procedure Laterality Date  . APPENDECTOMY    . CESAREAN SECTION    . CHOLECYSTECTOMY    . COLON RESECTION    . OVARY SURGERY      There were no vitals filed for this visit.   Subjective Assessment - 06/24/20 1030    Subjective Patient reports increased soreness in back pain after last session. She reports increased soreness in back today but reports that her legs are a little better. She did go to the pool this weekend and states that helped some.    Pertinent History Pt referred to PT for unsteadiness as well as L sided low back pain. Pt reports that her balance has been an issue for years but worsened after her back pain started. Her back pain began multiple months ago after moving into her new apartment. She is unable to provide an exact time fram. She saw [facility] PT however she reports that she was in so much pain that there was nothing they could do without worsening her pain and they called her and told her they couldn't help. Based on the referral it states that the patient was too unsteady to be treated at their clinic. She reports that when  her pain was really bad her legs were really shaky and were buckling. She was referred to pain management by Dr. Lavera Guise but still has not been contacted by their clinic. Denies history of any neck pain. She reports 4-5 falls in the last 6 months when her "back was really bothering her." PMH includes seizures, migraines, BPPV, and OA. Per medical record she has been seen multiple times at [place], and [facility] for this issue. She had an MRI which showed short pedicles of the lower lumbar spine and congenital canal narrowing with moderate canal stenosis at L3-4 level. She also has moderate bilateral foraminal stenosis at L4-5. Per patient she has an appointment with a spine specialist at [facility] next week.    Limitations Walking    Diagnostic tests Lumbar MRI: see history    Patient Stated Goals Improve balance and back pain    Currently in Pain? Yes    Pain Score 8     Pain Location Back    Pain Orientation Lower    Pain Descriptors / Indicators Aching;Sore    Pain Type Chronic pain    Pain Onset More than a month ago    Pain Frequency Intermittent    Aggravating Factors  prolonged sitting/standing    Pain Relieving  Factors initial standing, position change, temporary relief from heat    Effect of Pain on Daily Activities decreased activity tolerance;                TREATMENT: Hooklying with moist heat to low back concurrent with TENs, modulated setting, at tolerated intensity #3 concurrent with all exercise:   Hooklying: -lumbar trunk rotation x10 reps each direction -single knee to chest stretch 20 sec hold x2 reps each LE; -Hamstring stretch SLR with ankle DF/PF neural flossing x10 reps each LE;  -posterior pelvic rock 5 sec hold 2x10 reps; Patient required min-moderate verbal/tactile cues for correct exercise technique for optimal muscle activation and postural control;  Hooklying: With posterior pelvic rock: -alternate march x10 reps; -bridges with arms by side x10  reps;  Sidelying: Red tband clamshells 2x15 with min VCs for proper positioning for optimal strengthening;  Patient tolerated exercise well. With increased repetition/exercise, patient able to exhibit better ROM with less discomfort. She also reports less stiffness in low back; She does exhibit increased difficulty lifting LLE due to weakness;                         PT Education - 06/24/20 1032    Education Details TENs, exercise HEP    Person(s) Educated Patient    Methods Explanation;Verbal cues    Comprehension Verbalized understanding;Returned demonstration;Verbal cues required;Need further instruction            PT Short Term Goals - 05/15/20 1358      PT SHORT TERM GOAL #1   Title Pt will be independent with HEP for balance and low back pain in order to improve pain-free function at home and decrease risk for falls    Time 6    Period Weeks    Status New    Target Date 06/25/20             PT Long Term Goals - 05/15/20 1417      PT LONG TERM GOAL #1   Title Pt will improve BERG by at least 3 points in order to demonstrate clinically significant improvement in balance.    Baseline 05/14/20: 51/56    Time 12    Period Weeks    Status New    Target Date 08/06/20      PT LONG TERM GOAL #2   Title Pt will increase 10MWT by at least 0.13 m/s in order to demonstrate clinically significant improvement in community ambulation.    Baseline 05/14/20: Self-selected: 13.7s = 0.73 m/s; Fastest: 12.7s = 0.79 m/s    Time 12    Period Weeks    Status New    Target Date 08/06/20      PT LONG TERM GOAL #3   Title Pt will improve ABC by at least 13% in order to demonstrate clinically significant improvement in balance confidence.    Baseline 05/14/20: 27.5%    Time 12    Period Weeks    Status New    Target Date 08/06/20                 Plan - 06/24/20 1042    Clinical Impression Statement Patient presents to therapy with increased back pain  but reports less leg discomfort. PT applied moist heat/TENs for pain relief. Patient reports feeling less pain following TENs. Instructed patient in lumbar flexion exercise for increased core stabilization. She does require min VCs for proper position for optimal muscle activation.  She tolerated exercise well reporting less soreness following session. She does require min VCs for proper position for optimal muscle activation. Patient would benefit from additional skilled PT intervention to improve strength and reduce pain with ADLs.    Personal Factors and Comorbidities Comorbidity 3+;Time since onset of injury/illness/exacerbation;Fitness;Past/Current Experience    Comorbidities obesity, seizures, chronic pelvic pain    Examination-Activity Limitations Lift;Locomotion Level;Squat;Stairs;Stand    Examination-Participation Restrictions Community Activity;Shop    Stability/Clinical Decision Making Evolving/Moderate complexity    Rehab Potential Fair    PT Frequency 2x / week    PT Duration 12 weeks    PT Treatment/Interventions ADLs/Self Care Home Management;Aquatic Therapy;Biofeedback;Electrical Stimulation;Traction;Moist Heat;Gait training;Stair training;Functional mobility training;Therapeutic activities;Therapeutic exercise;Balance training;Patient/family education;Cognitive remediation;Neuromuscular re-education;Manual techniques;Scar mobilization;Passive range of motion;Taping;Dry needling;Spinal Manipulations;Joint Manipulations;Canalith Repostioning;Cryotherapy;Iontophoresis 4mg /ml Dexamethasone;Ultrasound;DME Instruction;Vestibular    PT Next Visit Plan Assess low back pain, update appropriate outcome measures/goals for LBP, initiate HEP for balance and LBP    PT Home Exercise Plan None currently    Consulted and Agree with Plan of Care Patient           Patient will benefit from skilled therapeutic intervention in order to improve the following deficits and impairments:  Decreased balance,  Difficulty walking, Decreased strength, Obesity, Pain  Visit Diagnosis: Unsteadiness on feet  Difficulty in walking, not elsewhere classified  Chronic low back pain, unspecified back pain laterality, unspecified whether sciatica present     Problem List Patient Active Problem List   Diagnosis Date Noted  . Chronic pelvic pain in female 08/24/2018  . Status post bilateral salpingo-oophorectomy (BSO) 08/24/2018  . History of 3 cesarean sections 08/24/2018  . Vasomotor symptoms due to menopause 08/24/2018  . BMI 33.0-33.9,adult 08/24/2018  . Urinary urgency 08/24/2018  . Urinary frequency 08/24/2018    Nicolaus Andel PT, DPT 06/24/2020, 10:53 AM  [place] MAIN [facility] SERVICES [address] [place], [place], [address] Phone: [phone]   Fax:  [phone]  Name: Kristen Hudson MRN: [id number] Date of Birth: October 29, 1966

## 2020-06-26 ENCOUNTER — Ambulatory Visit: Payer: Medicare Other | Admitting: Physical Therapy

## 2020-06-26 ENCOUNTER — Other Ambulatory Visit: Payer: Self-pay

## 2020-06-26 ENCOUNTER — Encounter: Payer: Self-pay | Admitting: Physical Therapy

## 2020-06-26 DIAGNOSIS — R262 Difficulty in walking, not elsewhere classified: Secondary | ICD-10-CM | POA: Diagnosis not present

## 2020-06-26 DIAGNOSIS — M545 Low back pain, unspecified: Secondary | ICD-10-CM

## 2020-06-26 DIAGNOSIS — R2681 Unsteadiness on feet: Secondary | ICD-10-CM

## 2020-06-26 DIAGNOSIS — G8929 Other chronic pain: Secondary | ICD-10-CM | POA: Diagnosis not present

## 2020-06-26 DIAGNOSIS — M62838 Other muscle spasm: Secondary | ICD-10-CM | POA: Diagnosis not present

## 2020-06-26 NOTE — Therapy (Signed)
[place] MAIN [facility] SERVICES [address] [place], [place], [address] Phone: [phone]   Fax:  [phone]  Physical Therapy Treatment  Patient Details  Name: Kristen Hudson MRN: [id number] Date of Birth: 11-19-1966 Referring Provider (PT): Dr. Alba Destine   Encounter Date: 06/26/2020   PT End of Session - 06/26/20 1113    Visit Number 8    Number of Visits 25    Date for PT Re-Evaluation 08/06/20    Authorization Type eval: 05/14/20    PT Start Time 1102    PT Stop Time 1145    PT Time Calculation (min) 43 min    Equipment Utilized During Treatment Gait belt    Activity Tolerance Patient tolerated treatment well    Behavior During Therapy [facility] for tasks assessed/performed           Past Medical History:  Diagnosis Date  . Acid reflux   . Seizures ([place])     Past Surgical History:  Procedure Laterality Date  . APPENDECTOMY    . CESAREAN SECTION    . CHOLECYSTECTOMY    . COLON RESECTION    . OVARY SURGERY      There were no vitals filed for this visit.   Subjective Assessment - 06/26/20 1110    Subjective Patient reports increased soreness in low back. She states, "I think that I'm overdoing it." She reports that she is walking, exercising in the pool and doing HEP;    Pertinent History Pt referred to PT for unsteadiness as well as L sided low back pain. Pt reports that her balance has been an issue for years but worsened after her back pain started. Her back pain began multiple months ago after moving into her new apartment. She is unable to provide an exact time fram. She saw [facility] PT however she reports that she was in so much pain that there was nothing they could do without worsening her pain and they called her and told her they couldn't help. Based on the referral it states that the patient was too unsteady to be treated at their clinic. She reports that when her pain was really bad her legs were really shaky and were  buckling. She was referred to pain management by Dr. Lavera Guise but still has not been contacted by their clinic. Denies history of any neck pain. She reports 4-5 falls in the last 6 months when her "back was really bothering her." PMH includes seizures, migraines, BPPV, and OA. Per medical record she has been seen multiple times at [facility], and [facility] for this issue. She had an MRI which showed short pedicles of the lower lumbar spine and congenital canal narrowing with moderate canal stenosis at L3-4 level. She also has moderate bilateral foraminal stenosis at L4-5. Per patient she has an appointment with a spine specialist at [facility] next week.    Limitations Walking    Diagnostic tests Lumbar MRI: see history    Patient Stated Goals Improve balance and back pain    Currently in Pain? Yes    Pain Score 9     Pain Location Back    Pain Orientation Lower    Pain Descriptors / Indicators Aching;Sore    Pain Type Chronic pain    Pain Onset More than a month ago    Pain Frequency Intermittent    Aggravating Factors  prolonged sitting/standing;    Pain Relieving Factors initial standing, position change, temporary relief from heat  Effect of Pain on Daily Activities decreased activity tolerance;    Multiple Pain Sites No                TREATMENT: Hooklying with moist heat to low back concurrent with TENs, modulated setting, at tolerated intensity #4 concurrent with all exercise:   Hooklying: -lumbar trunk rotation x10 reps each direction -single knee to chest stretch 20 sec hold x2 reps each LE; -LLE hamstring neural stretch with hip flexion and knee extension/flexio 5 sec hold x5 reps; -Hamstring stretch SLR with ankle DF/PF neural flossing x10 reps each LE;  -posterior pelvic rock 5 sec hold 3x10 reps;  Patient required min-moderate verbal/tactile cues for correct exercise techniquefor optimal muscle activation and postural control;   Hooklying: With posterior pelvic rock: -alternate  march 2x10 reps, red tband for strengthening; -hip abduction/ER x15 reps, red tband for strengthening;  -bridges with arms by side x10 reps; -SLR hip flexion x10 reps with cues for core stabilization for better lumbar support;   Patient tolerated exercise well. With increased repetition/exercise, patient able to exhibit better ROM with less discomfort. She also reports less stiffness in low back; She reports less back pain with lumbar flexion ROM; She also reports less radicular symptoms with flexion position;  Patient continues to have weakness in LLE and compensates with decreased ROM.                          PT Education - 06/26/20 1113    Education Details TENs, exercise, HEP    Person(s) Educated Patient    Methods Explanation;Verbal cues    Comprehension Verbalized understanding;Returned demonstration;Verbal cues required;Need further instruction            PT Short Term Goals - 05/15/20 1358      PT SHORT TERM GOAL #1   Title Pt will be independent with HEP for balance and low back pain in order to improve pain-free function at home and decrease risk for falls    Time 6    Period Weeks    Status New    Target Date 06/25/20             PT Long Term Goals - 05/15/20 1417      PT LONG TERM GOAL #1   Title Pt will improve BERG by at least 3 points in order to demonstrate clinically significant improvement in balance.    Baseline 05/14/20: 51/56    Time 12    Period Weeks    Status New    Target Date 08/06/20      PT LONG TERM GOAL #2   Title Pt will increase 10MWT by at least 0.13 m/s in order to demonstrate clinically significant improvement in community ambulation.    Baseline 05/14/20: Self-selected: 13.7s = 0.73 m/s; Fastest: 12.7s = 0.79 m/s    Time 12    Period Weeks    Status New    Target Date 08/06/20      PT LONG TERM GOAL #3   Title Pt will improve ABC by at least 13% in order to demonstrate clinically significant improvement in  balance confidence.    Baseline 05/14/20: 27.5%    Time 12    Period Weeks    Status New    Target Date 08/06/20                 Plan - 06/26/20 1116    Clinical Impression Statement Patient presents to  therapy with increased low back pain with occasional LLE tingling. She reports that she has been working on walking around her neighborhood and has been exercising in the pool. She continues to respond well to TENs and moist heat for pain relief. Patient instructed in lumbar flexion exercise to improve core stabilization. She does require min VCs for proper exercise technique. She reports less discomfort with posterior pelvic rocks and was able to exhibit good abdominal stabilization. Patient would benefit from additional skilled PT intervention to improve strength and mobility while reducing back pain;    Personal Factors and Comorbidities Comorbidity 3+;Time since onset of injury/illness/exacerbation;Fitness;Past/Current Experience    Comorbidities obesity, seizures, chronic pelvic pain    Examination-Activity Limitations Lift;Locomotion Level;Squat;Stairs;Stand    Examination-Participation Restrictions Community Activity;Shop    Stability/Clinical Decision Making Evolving/Moderate complexity    Rehab Potential Fair    PT Frequency 2x / week    PT Duration 12 weeks    PT Treatment/Interventions ADLs/Self Care Home Management;Aquatic Therapy;Biofeedback;Electrical Stimulation;Traction;Moist Heat;Gait training;Stair training;Functional mobility training;Therapeutic activities;Therapeutic exercise;Balance training;Patient/family education;Cognitive remediation;Neuromuscular re-education;Manual techniques;Scar mobilization;Passive range of motion;Taping;Dry needling;Spinal Manipulations;Joint Manipulations;Canalith Repostioning;Cryotherapy;Iontophoresis 4mg /ml Dexamethasone;Ultrasound;DME Instruction;Vestibular    PT Next Visit Plan Assess low back pain, update appropriate outcome  measures/goals for LBP, initiate HEP for balance and LBP    PT Home Exercise Plan None currently    Consulted and Agree with Plan of Care Patient           Patient will benefit from skilled therapeutic intervention in order to improve the following deficits and impairments:  Decreased balance, Difficulty walking, Decreased strength, Obesity, Pain  Visit Diagnosis: Unsteadiness on feet  Difficulty in walking, not elsewhere classified  Chronic low back pain, unspecified back pain laterality, unspecified whether sciatica present  Other muscle spasm     Problem List Patient Active Problem List   Diagnosis Date Noted  . Chronic pelvic pain in female 08/24/2018  . Status post bilateral salpingo-oophorectomy (BSO) 08/24/2018  . History of 3 cesarean sections 08/24/2018  . Vasomotor symptoms due to menopause 08/24/2018  . BMI 33.0-33.9,adult 08/24/2018  . Urinary urgency 08/24/2018  . Urinary frequency 08/24/2018    Chesley Valls PT, DPT 06/26/2020, 11:39 AM  [place] MAIN [facility] SERVICES [address] [place], [place], [address] Phone: [phone]   Fax:  [phone]  Name: Kristen Hudson MRN: [id number] Date of Birth: 08/26/1966

## 2020-07-02 ENCOUNTER — Ambulatory Visit: Payer: Medicare Other | Admitting: Physical Therapy

## 2020-07-04 ENCOUNTER — Ambulatory Visit: Payer: Medicare Other | Admitting: Physical Therapy

## 2020-07-08 ENCOUNTER — Other Ambulatory Visit: Payer: Self-pay

## 2020-07-08 ENCOUNTER — Encounter: Payer: Self-pay | Admitting: Physical Therapy

## 2020-07-08 ENCOUNTER — Ambulatory Visit: Payer: Medicare Other | Attending: Physical Medicine & Rehabilitation | Admitting: Physical Therapy

## 2020-07-08 DIAGNOSIS — M545 Low back pain, unspecified: Secondary | ICD-10-CM

## 2020-07-08 DIAGNOSIS — M62838 Other muscle spasm: Secondary | ICD-10-CM

## 2020-07-08 DIAGNOSIS — G8929 Other chronic pain: Secondary | ICD-10-CM | POA: Insufficient documentation

## 2020-07-08 DIAGNOSIS — R2681 Unsteadiness on feet: Secondary | ICD-10-CM

## 2020-07-08 DIAGNOSIS — R262 Difficulty in walking, not elsewhere classified: Secondary | ICD-10-CM

## 2020-07-08 NOTE — Therapy (Signed)
[place] MAIN [facility] SERVICES [address] [place], [place], [address] Phone: [phone]   Fax:  [phone]  Physical Therapy Treatment  Patient Details  Name: Kristen Hudson MRN: [id number] Date of Birth: 11-09-1966 Referring Provider (PT): Dr. Alba Destine   Encounter Date: 07/08/2020   PT End of Session - 07/08/20 1029    Visit Number 9    Number of Visits 25    Date for PT Re-Evaluation 08/06/20    Authorization Type eval: 05/14/20    PT Start Time 1020    PT Stop Time 1100    PT Time Calculation (min) 40 min    Equipment Utilized During Treatment Gait belt    Activity Tolerance Patient tolerated treatment well    Behavior During Therapy [facility] for tasks assessed/performed           Past Medical History:  Diagnosis Date  . Acid reflux   . Seizures ([place])     Past Surgical History:  Procedure Laterality Date  . APPENDECTOMY    . CESAREAN SECTION    . CHOLECYSTECTOMY    . COLON RESECTION    . OVARY SURGERY      There were no vitals filed for this visit.   Subjective Assessment - 07/08/20 1027    Subjective Patient reports having a good vacation. She reports having an episode of her left leg going out and having severe pain down LLE. She reports continued low back pain;    Pertinent History Pt referred to PT for unsteadiness as well as L sided low back pain. Pt reports that her balance has been an issue for years but worsened after her back pain started. Her back pain began multiple months ago after moving into her new apartment. She is unable to provide an exact time fram. She saw [facility] PT however she reports that she was in so much pain that there was nothing they could do without worsening her pain and they called her and told her they couldn't help. Based on the referral it states that the patient was too unsteady to be treated at their clinic. She reports that when her pain was really bad her legs were really shaky and were  buckling. She was referred to pain management by Dr. Lavera Guise but still has not been contacted by their clinic. Denies history of any neck pain. She reports 4-5 falls in the last 6 months when her "back was really bothering her." PMH includes seizures, migraines, BPPV, and OA. Per medical record she has been seen multiple times at [facility], and [facility] for this issue. She had an MRI which showed short pedicles of the lower lumbar spine and congenital canal narrowing with moderate canal stenosis at L3-4 level. She also has moderate bilateral foraminal stenosis at L4-5. Per patient she has an appointment with a spine specialist at [facility] next week.    Limitations Walking    Diagnostic tests Lumbar MRI: see history    Patient Stated Goals Improve balance and back pain    Currently in Pain? Yes    Pain Score 10-Worst pain ever    Pain Location Back    Pain Orientation Left;Lower    Pain Descriptors / Indicators Aching;Sore    Pain Type Chronic pain    Pain Onset More than a month ago    Pain Frequency Intermittent    Aggravating Factors  prolonged sitting/standing    Pain Relieving Factors initial standing, position change, temporary relief from heat  Effect of Pain on Daily Activities decreased activity tolerance;    Multiple Pain Sites No               TREATMENT: Hooklying with moist heat to low back concurrent with TENs, modulated setting, at tolerated intensity #5 concurrent with all exercise:  Hooklying: -lumbar trunk rotation x10 reps each direction -single knee to chest stretch 20 sec hold x1 reps each LE; -posterior pelvic rock 5 sec hold 3x10 reps;  Patient required min-moderate verbal/tactile cues for correct exercise techniquefor optimal muscle activation and postural control;   Hooklying: With posterior pelvic rock: -alternate march 2x12 reps, red tband for strengthening; -hip abduction/ER 2x15 reps, red tband for strengthening;   Sidelying:  -hip abduction clamshell  x15 reps, red tband each LE   Patient tolerated exercise fair,  With increased repetition/exercise,patient able to exhibit better ROM with less discomfort.She also reports less stiffness in low back; She reports less back pain with lumbar flexion ROM; She also reports less radicular symptoms with flexion position;  Patient continues to have weakness in LLE and compensates with decreased ROM.                         PT Education - 07/08/20 1028    Education Details TENs, exercise, HEP    Person(s) Educated Patient    Methods Explanation;Verbal cues    Comprehension Verbalized understanding;Returned demonstration;Verbal cues required;Need further instruction            PT Short Term Goals - 05/15/20 1358      PT SHORT TERM GOAL #1   Title Pt will be independent with HEP for balance and low back pain in order to improve pain-free function at home and decrease risk for falls    Time 6    Period Weeks    Status New    Target Date 06/25/20             PT Long Term Goals - 05/15/20 1417      PT LONG TERM GOAL #1   Title Pt will improve BERG by at least 3 points in order to demonstrate clinically significant improvement in balance.    Baseline 05/14/20: 51/56    Time 12    Period Weeks    Status New    Target Date 08/06/20      PT LONG TERM GOAL #2   Title Pt will increase 10MWT by at least 0.13 m/s in order to demonstrate clinically significant improvement in community ambulation.    Baseline 05/14/20: Self-selected: 13.7s = 0.73 m/s; Fastest: 12.7s = 0.79 m/s    Time 12    Period Weeks    Status New    Target Date 08/06/20      PT LONG TERM GOAL #3   Title Pt will improve ABC by at least 13% in order to demonstrate clinically significant improvement in balance confidence.    Baseline 05/14/20: 27.5%    Time 12    Period Weeks    Status New    Target Date 08/06/20                 Plan - 07/08/20 1039    Clinical Impression Statement  Patient presents to therapy with increased low back/LLE pain. She reports having a hard time with riding in the car on her vacation. Patient does respond well to TENs reporting less discomfort in low back with heat/TENs. Patient instructed in advanced LE/core stabilization exercise.  She does require min VCs for proper positioning/exercise technique. Patient would benefit from additional skilled PT intervention to improve strength and mobility while reducing back pain;    Personal Factors and Comorbidities Comorbidity 3+;Time since onset of injury/illness/exacerbation;Fitness;Past/Current Experience    Comorbidities obesity, seizures, chronic pelvic pain    Examination-Activity Limitations Lift;Locomotion Level;Squat;Stairs;Stand    Examination-Participation Restrictions Community Activity;Shop    Stability/Clinical Decision Making Evolving/Moderate complexity    Rehab Potential Fair    PT Frequency 2x / week    PT Duration 12 weeks    PT Treatment/Interventions ADLs/Self Care Home Management;Aquatic Therapy;Biofeedback;Electrical Stimulation;Traction;Moist Heat;Gait training;Stair training;Functional mobility training;Therapeutic activities;Therapeutic exercise;Balance training;Patient/family education;Cognitive remediation;Neuromuscular re-education;Manual techniques;Scar mobilization;Passive range of motion;Taping;Dry needling;Spinal Manipulations;Joint Manipulations;Canalith Repostioning;Cryotherapy;Iontophoresis 4mg /ml Dexamethasone;Ultrasound;DME Instruction;Vestibular    PT Next Visit Plan Assess low back pain, update appropriate outcome measures/goals for LBP, initiate HEP for balance and LBP    PT Home Exercise Plan None currently    Consulted and Agree with Plan of Care Patient           Patient will benefit from skilled therapeutic intervention in order to improve the following deficits and impairments:  Decreased balance, Difficulty walking, Decreased strength, Obesity, Pain  Visit  Diagnosis: Unsteadiness on feet  Difficulty in walking, not elsewhere classified  Chronic low back pain, unspecified back pain laterality, unspecified whether sciatica present  Other muscle spasm     Problem List Patient Active Problem List   Diagnosis Date Noted  . Chronic pelvic pain in female 08/24/2018  . Status post bilateral salpingo-oophorectomy (BSO) 08/24/2018  . History of 3 cesarean sections 08/24/2018  . Vasomotor symptoms due to menopause 08/24/2018  . BMI 33.0-33.9,adult 08/24/2018  . Urinary urgency 08/24/2018  . Urinary frequency 08/24/2018    Dickie Cloe  PT, DPT 07/08/2020, 11:17 AM  [place] MAIN [facility] SERVICES [address] [place], [place], [address] Phone: [phone]   Fax:  [phone]  Name: Tylyn Stankovich MRN: [id number] Date of Birth: 1966-12-11

## 2020-07-10 ENCOUNTER — Other Ambulatory Visit: Payer: Self-pay

## 2020-07-10 ENCOUNTER — Encounter: Payer: Self-pay | Admitting: Physical Therapy

## 2020-07-10 ENCOUNTER — Ambulatory Visit: Payer: Medicare Other | Admitting: Physical Therapy

## 2020-07-10 DIAGNOSIS — G8929 Other chronic pain: Secondary | ICD-10-CM | POA: Diagnosis not present

## 2020-07-10 DIAGNOSIS — R2681 Unsteadiness on feet: Secondary | ICD-10-CM | POA: Diagnosis not present

## 2020-07-10 DIAGNOSIS — M545 Low back pain: Secondary | ICD-10-CM | POA: Diagnosis not present

## 2020-07-10 DIAGNOSIS — R262 Difficulty in walking, not elsewhere classified: Secondary | ICD-10-CM

## 2020-07-10 DIAGNOSIS — M62838 Other muscle spasm: Secondary | ICD-10-CM | POA: Diagnosis not present

## 2020-07-10 NOTE — Therapy (Signed)
[place] MAIN [facility] SERVICES [address] [place], [place], [address] Phone: [phone]   Fax:  [phone]  Physical Therapy Treatment Physical Therapy Progress Note   Dates of reporting period  05/14/20   to   07/10/20   Patient Details  Name: Kristen Hudson MRN: [id number] Date of Birth: June 15, 1966 Referring Provider (PT): Dr. Alba Destine   Encounter Date: 07/10/2020   PT End of Session - 07/10/20 1031    Visit Number 10    Number of Visits 25    Date for PT Re-Evaluation 08/06/20    Authorization Type eval: 05/14/20    PT Start Time 1015    PT Stop Time 1100    PT Time Calculation (min) 45 min    Equipment Utilized During Treatment Gait belt    Activity Tolerance Patient tolerated treatment well    Behavior During Therapy [facility] for tasks assessed/performed           Past Medical History:  Diagnosis Date  . Acid reflux   . Seizures ([place])     Past Surgical History:  Procedure Laterality Date  . APPENDECTOMY    . CESAREAN SECTION    . CHOLECYSTECTOMY    . COLON RESECTION    . OVARY SURGERY      There were no vitals filed for this visit.   Subjective Assessment - 07/10/20 1030    Subjective Patient reports increased LLE pain especially in hip; She reports her low back has slowly been improving but her hip is really burning. She also reports increased fatigue;    Pertinent History Pt referred to PT for unsteadiness as well as L sided low back pain. Pt reports that her balance has been an issue for years but worsened after her back pain started. Her back pain began multiple months ago after moving into her new apartment. She is unable to provide an exact time fram. She saw [facility] PT however she reports that she was in so much pain that there was nothing they could do without worsening her pain and they called her and told her they couldn't help. Based on the referral it states that the patient was too unsteady to be treated  at their clinic. She reports that when her pain was really bad her legs were really shaky and were buckling. She was referred to pain management by Dr. Lavera Guise but still has not been contacted by their clinic. Denies history of any neck pain. She reports 4-5 falls in the last 6 months when her "back was really bothering her." PMH includes seizures, migraines, BPPV, and OA. Per medical record she has been seen multiple times at [place], and [facility] for this issue. She had an MRI which showed short pedicles of the lower lumbar spine and congenital canal narrowing with moderate canal stenosis at L3-4 level. She also has moderate bilateral foraminal stenosis at L4-5. Per patient she has an appointment with a spine specialist at [facility] next week.    Limitations Walking    Diagnostic tests Lumbar MRI: see history    Patient Stated Goals Improve balance and back pain    Currently in Pain? Yes    Pain Score 10-Worst pain ever    Pain Location Hip    Pain Orientation Left;Lower    Pain Descriptors / Indicators Aching;Sore    Pain Type Chronic pain    Pain Onset More than a month ago    Pain Frequency Intermittent    Aggravating  Factors  prolonged sitting/standing    Pain Relieving Factors initial standing, position change, temporary relief from heat/TENs    Effect of Pain on Daily Activities decreased activity tolerance;    Multiple Pain Sites No              [facility] PT Assessment - 07/10/20 0001      Observation/Other Assessments   Focus on Therapeutic Outcomes (FOTO)  48.3% (more impaired from 56% at evaluation)    Other Surveys  Activities of Balance and Confidence Scale    Activities of Balance Confidence Scale (ABC Scale)  39.3%      AROM   Lumbar Flexion 30    Lumbar Extension 10    Lumbar - Right Side Bend 20    Lumbar - Left Side Bend 20      Standardized Balance Assessment   10 Meter Walk 0.75 m/s without AD            TREATMENT: Patient presents with increased low back/LLE hip  pain;  PT applied TENs to left hip/low back, modulated setting at tolerated intensity (#5-6) concurrent with moist heat to low back x15 min;  PT instructed patient in lumbar ROM to help with pain: Hooklying: Posterior pelvic rock x10 reps; Lumbar trunk rotation x5 reps each direction Single knee to chest stretch 15 sec hold x1 rep each;  PT assessed lumbar AROM, instructed patient in FOTO/ABC scale and assessed gait speed, see above;  She has made progress towards some goals but has not met all goals.  Patient's condition has the potential to improve in response to therapy. Maximum improvement is yet to be obtained. The anticipated improvement is attainable and reasonable in a generally predictable time.  Patient reports adherence to HEP and states that even though she is still having pain she does feel like she is improving in her mobility;                      PT Education - 07/10/20 1031    Education Details TENs, progress towards goals, HEP    Person(s) Educated Patient    Methods Explanation;Verbal cues    Comprehension Verbalized understanding;Returned demonstration;Verbal cues required;Need further instruction            PT Short Term Goals - 07/10/20 1031      PT SHORT TERM GOAL #1   Title Pt will be independent with HEP for balance and low back pain in order to improve pain-free function at home and decrease risk for falls    Baseline 7/14: doing HEP daily;    Time 6    Period Weeks    Status Achieved    Target Date 06/25/20             PT Long Term Goals - 07/10/20 1032      PT LONG TERM GOAL #1   Title Pt will improve BERG by at least 3 points in order to demonstrate clinically significant improvement in balance.    Baseline 05/14/20: 51/56    Time 12    Period Weeks    Status Not Met    Target Date 08/06/20      PT LONG TERM GOAL #2   Title Pt will increase 10MWT by at least 0.13 m/s in order to demonstrate clinically significant  improvement in community ambulation.    Baseline 05/14/20: Self-selected: 13.7s = 0.73 m/s; Fastest: 12.7s = 0.79 m/s, 7/14: 0.75 m/s    Time 12  Period Weeks    Status Not Met    Target Date 08/06/20      PT LONG TERM GOAL #3   Title Pt will improve ABC by at least 13% in order to demonstrate clinically significant improvement in balance confidence.    Baseline 05/14/20: 27.5%, 7/14: 39.3%    Time 12    Period Weeks    Status Achieved    Target Date 08/06/20              Plan - 07/11/20 9509    Clinical Impression Statement Patient presents to therapy with continued severe low back/LLE hip pain. She reports continued pain which is worse with standing and moving. Patient reports adherence with HEP and states that while she is doing the exercise she will get mild relief but her pain never goes away. Patient exhibits no significant change in lumbar ROM. She also exhibits no significant change in gait speed. She scored lower on FOTO survey than at intake. Patient did however score higher on ABC scale reporting improved confidence with balance. Patient does feel like therapy is helping even though she is still experiencing back pain. Recommend that patient continue with skilled PT Intervention to improve LE strength for improved mobility while trying to help reduce back pain to tolerance. patient verbalized understanding;    Personal Factors and Comorbidities Comorbidity 3+;Time since onset of injury/illness/exacerbation;Fitness;Past/Current Experience    Comorbidities obesity, seizures, chronic pelvic pain    Examination-Activity Limitations Lift;Locomotion Level;Squat;Stairs;Stand    Examination-Participation Restrictions Community Activity;Shop    Stability/Clinical Decision Making Evolving/Moderate complexity    Rehab Potential Fair    PT Frequency 2x / week    PT Duration 12 weeks    PT Treatment/Interventions ADLs/Self Care Home Management;Aquatic Therapy;Biofeedback;Electrical  Stimulation;Traction;Moist Heat;Gait training;Stair training;Functional mobility training;Therapeutic activities;Therapeutic exercise;Balance training;Patient/family education;Cognitive remediation;Neuromuscular re-education;Manual techniques;Scar mobilization;Passive range of motion;Taping;Dry needling;Spinal Manipulations;Joint Manipulations;Canalith Repostioning;Cryotherapy;Iontophoresis 83m/ml Dexamethasone;Ultrasound;DME Instruction;Vestibular    PT Next Visit Plan Assess low back pain, update appropriate outcome measures/goals for LBP, initiate HEP for balance and LBP    PT Home Exercise Plan None currently    Consulted and Agree with Plan of Care Patient                 Patient will benefit from skilled therapeutic intervention in order to improve the following deficits and impairments:     Visit Diagnosis: Unsteadiness on feet  Difficulty in walking, not elsewhere classified  Chronic low back pain, unspecified back pain laterality, unspecified whether sciatica present  Other muscle spasm     Problem List Patient Active Problem List   Diagnosis Date Noted  . Chronic pelvic pain in female 08/24/2018  . Status post bilateral salpingo-oophorectomy (BSO) 08/24/2018  . History of 3 cesarean sections 08/24/2018  . Vasomotor symptoms due to menopause 08/24/2018  . BMI 33.0-33.9,adult 08/24/2018  . Urinary urgency 08/24/2018  . Urinary frequency 08/24/2018    Honour Schwieger PT, DPT 07/10/2020, 3:38 PM  [place] [facility] [address] [address]Phone: [phone]  Fax:  [phone] Name: RElfida ShimadaMRN: [id number] of Birth: [birth year]

## 2020-07-16 ENCOUNTER — Ambulatory Visit: Payer: Medicare Other | Admitting: Physical Therapy

## 2020-07-22 ENCOUNTER — Ambulatory Visit: Payer: Medicare Other | Admitting: Physical Therapy

## 2020-07-24 ENCOUNTER — Ambulatory Visit: Payer: Medicare Other | Admitting: Physical Therapy

## 2020-07-31 ENCOUNTER — Ambulatory Visit: Payer: Medicare Other | Attending: Physical Medicine & Rehabilitation | Admitting: Physical Therapy

## 2020-08-05 ENCOUNTER — Ambulatory Visit: Payer: Medicare Other | Admitting: Physical Therapy

## 2020-08-08 ENCOUNTER — Ambulatory Visit: Payer: Medicare Other | Admitting: Physical Therapy

## 2020-08-12 ENCOUNTER — Ambulatory Visit: Payer: Medicare Other | Admitting: Physical Therapy

## 2020-08-14 ENCOUNTER — Ambulatory Visit: Payer: Medicare Other | Admitting: Physical Therapy

## 2020-08-19 ENCOUNTER — Ambulatory Visit: Payer: Medicare Other | Admitting: Physical Therapy

## 2020-08-21 ENCOUNTER — Ambulatory Visit: Payer: Medicare Other | Admitting: Physical Therapy

## 2020-08-26 ENCOUNTER — Ambulatory Visit: Payer: Medicare Other | Admitting: Physical Therapy

## 2020-08-28 ENCOUNTER — Ambulatory Visit: Payer: Medicare Other | Admitting: Physical Therapy

## 2020-09-10 ENCOUNTER — Other Ambulatory Visit: Payer: Self-pay | Admitting: *Deleted

## 2020-09-10 MED ORDER — GABAPENTIN 300 MG PO CAPS: 300.0000 mg | ORAL_CAPSULE | Freq: Two times a day (BID) | ORAL | 3 refills | Status: DC

## 2020-09-10 MED ORDER — LEVETIRACETAM 500 MG PO TABS: 500.0000 mg | ORAL_TABLET | Freq: Two times a day (BID) | ORAL | 3 refills | Status: DC

## 2020-09-10 MED ORDER — MELOXICAM 15 MG PO TABS: 15.0000 mg | ORAL_TABLET | Freq: Every day | ORAL | 3 refills | Status: DC

## 2020-09-10 MED ORDER — TRAZODONE HCL 50 MG PO TABS: 50.0000 mg | ORAL_TABLET | Freq: Every day | ORAL | 3 refills | Status: DC

## 2020-09-12 ENCOUNTER — Other Ambulatory Visit: Payer: Self-pay | Admitting: *Deleted

## 2020-09-13 ENCOUNTER — Other Ambulatory Visit: Payer: Self-pay | Admitting: *Deleted

## 2020-09-13 MED ORDER — GABAPENTIN 300 MG PO CAPS: 300.0000 mg | ORAL_CAPSULE | Freq: Two times a day (BID) | ORAL | 3 refills | Status: DC

## 2020-09-23 ENCOUNTER — Other Ambulatory Visit: Payer: Self-pay | Admitting: *Deleted

## 2020-09-23 MED ORDER — LEVETIRACETAM 500 MG PO TABS: 500.0000 mg | ORAL_TABLET | Freq: Two times a day (BID) | ORAL | 3 refills | Status: DC

## 2020-10-24 ENCOUNTER — Other Ambulatory Visit: Payer: Self-pay

## 2020-10-24 ENCOUNTER — Ambulatory Visit (INDEPENDENT_AMBULATORY_CARE_PROVIDER_SITE_OTHER): Payer: Medicare HMO | Admitting: Family Medicine

## 2020-10-24 ENCOUNTER — Encounter: Payer: Self-pay | Admitting: Family Medicine

## 2020-10-24 VITALS — BP 142/95 | HR 98 | Ht 64.0 in | Wt 234.7 lb

## 2020-10-24 DIAGNOSIS — N3000 Acute cystitis without hematuria: Secondary | ICD-10-CM | POA: Diagnosis not present

## 2020-10-24 DIAGNOSIS — Z20822 Contact with and (suspected) exposure to covid-19: Secondary | ICD-10-CM

## 2020-10-24 DIAGNOSIS — R300 Dysuria: Secondary | ICD-10-CM

## 2020-10-24 LAB — POCT URINALYSIS DIPSTICK
Bilirubin, UA: NEGATIVE
Blood, UA: NEGATIVE
Glucose, UA: NEGATIVE
Ketones, UA: NEGATIVE
Nitrite, UA: NEGATIVE
Protein, UA: POSITIVE — AB
Spec Grav, UA: >=1.03 — AB (ref 1.010–1.025)
Urobilinogen, UA: 0.2 E.U./dL
pH, UA: 5.5 (ref 5.0–8.0)

## 2020-10-24 LAB — POC COVID19 BINAXNOW: SARS Coronavirus 2 Ag: NEGATIVE

## 2020-10-24 MED ORDER — CIPROFLOXACIN HCL 500 MG PO TABS: 500.0000 mg | ORAL_TABLET | Freq: Two times a day (BID) | ORAL | 0 refills | Status: DC

## 2020-10-24 NOTE — Progress Notes (Addendum)
Established Patient Office Visit  SUBJECTIVE:  Subjective  Patient ID: Kristen Hudson, female    DOB: [birth year]  Age: 54 y.o. MRN: [id number]  CC:  Chief Complaint  Patient presents with  . URI    patient complains for body aches, cough, congestion, low grade fever x 2 days     HPI Kristen Hudson is a 54 y.o. female presenting today for dysuria, fever and chills x 3 days with cough.     Past Medical History:  Diagnosis Date  . Acid reflux   . Seizures ([place])     Past Surgical History:  Procedure Laterality Date  . APPENDECTOMY    . CESAREAN SECTION    . CHOLECYSTECTOMY    . COLON RESECTION    . OVARY SURGERY      Family History  Problem Relation Age of Onset  . Breast cancer Mother 23  . Colon cancer Father   . Diabetes Father   . Ovarian cancer Neg Hx     Social History   Socioeconomic History  . Marital status: Single    Spouse name: Not on file  . Number of children: Not on file  . Years of education: Not on file  . Highest education level: Not on file  Occupational History  . Not on file  Tobacco Use  . Smoking status: Current Every Day Smoker    Packs/day: 0.25    Types: Cigarettes  . Smokeless tobacco: Never Used  Vaping Use  . Vaping Use: Never used  Substance and Sexual Activity  . Alcohol use: Not Currently  . Drug use: Not Currently  . Sexual activity: Not Currently  Other Topics Concern  . Not on file  Social History Narrative  . Not on file   Social Determinants of Health   Financial Resource Strain:   . Difficulty of Paying Living Expenses: Not on file  Food Insecurity:   . Worried About Charity fundraiser in the Last Year: Not on file  . Ran Out of Food in the Last Year: Not on file  Transportation Needs:   . Lack of Transportation (Medical): Not on file  . Lack of Transportation (Non-Medical): Not on file  Physical Activity:   . Days of Exercise per Week: Not on file  . Minutes of Exercise per Session: Not on file  Stress:    . Feeling of Stress : Not on file  Social Connections:   . Frequency of Communication with Friends and Family: Not on file  . Frequency of Social Gatherings with Friends and Family: Not on file  . Attends Religious Services: Not on file  . Active Member of Clubs or Organizations: Not on file  . Attends Archivist Meetings: Not on file  . Marital Status: Not on file  Intimate Partner Violence:   . Fear of Current or Ex-Partner: Not on file  . Emotionally Abused: Not on file  . Physically Abused: Not on file  . Sexually Abused: Not on file     Current Outpatient Medications:  .  Baclofen 5 MG TABS, Take 1 tablet by mouth 3 (three) times daily as needed (Muscle spasms)., Disp: 15 tablet, Rfl: 0 .  cyclobenzaprine (FLEXERIL) 5 MG tablet, TAKE 1 TO 2 TABLETS BY MOUTH THREE TIMES DAILY AS NEEDED FOR MUSCLE SPASM, Disp: , Rfl:  .  diazepam (VALIUM) 5 MG tablet, Take 1 tablet (5 mg total) by mouth every 6 (six) hours as needed (vaginal valium). (Patient taking differently:  Take 5 mg by mouth every 6 (six) hours as needed. ), Disp: 90 tablet, Rfl: 2 .  gabapentin (NEURONTIN) 300 MG capsule, Take 1 capsule (300 mg total) by mouth 2 (two) times daily., Disp: 180 capsule, Rfl: 3 .  levETIRAcetam (KEPPRA) 500 MG tablet, Take 1 tablet (500 mg total) by mouth 2 (two) times daily., Disp: 180 tablet, Rfl: 3 .  lidocaine (LIDODERM) 5 %, Place 1 patch onto the skin every 12 (twelve) hours. Remove & Discard patch within 12 hours or as directed by MD, Disp: 10 patch, Rfl: 0 .  meloxicam (MOBIC) 15 MG tablet, Take 1 tablet (15 mg total) by mouth daily., Disp: 90 tablet, Rfl: 3 .  methocarbamol (ROBAXIN) 500 MG tablet, Take 1 tablet (500 mg total) by mouth 4 (four) times daily., Disp: 16 tablet, Rfl: 0 .  oxyCODONE-acetaminophen (PERCOCET) 5-325 MG tablet, Take 1 tablet by mouth every 6 (six) hours as needed for severe pain., Disp: 15 tablet, Rfl: 0 .  phenytoin (DILANTIN) 100 MG ER capsule, Take 200  mg by mouth 2 (two) times daily., Disp: , Rfl:  .  phenytoin (DILANTIN) 125 MG/5ML suspension, Take 125 mg by mouth 2 (two) times daily. , Disp: , Rfl:  .  predniSONE (DELTASONE) 10 MG tablet, Take 1 tablet (10 mg total) by mouth daily., Disp: 42 tablet, Rfl: 0 .  traMADol (ULTRAM) 50 MG tablet, Take 1 tablet (50 mg total) by mouth every 6 (six) hours as needed for moderate pain., Disp: 12 tablet, Rfl: 0 .  traZODone (DESYREL) 50 MG tablet, Take 1 tablet (50 mg total) by mouth daily., Disp: 90 tablet, Rfl: 3 .  ciprofloxacin (CIPRO) 500 MG tablet, Take 1 tablet (500 mg total) by mouth 2 (two) times daily., Disp: 20 tablet, Rfl: 0   Allergies  Allergen Reactions  . Penicillins Anaphylaxis    Did it involve swelling of the face/tongue/throat, SOB, or low BP? No Did it involve sudden or severe rash/hives, skin peeling, or any reaction on the inside of your mouth or nose? No Did you need to seek medical attention at a hospital or doctor's office? No When did it last happen?teenager If all above answers are "NO", may proceed with cephalosporin use.     ROS Review of Systems  Constitutional: Positive for appetite change, chills, fatigue and fever.  Respiratory: Negative.   Cardiovascular: Negative.   Gastrointestinal: Negative.   Genitourinary: Negative.   Musculoskeletal: Negative.   Skin: Negative.   Psychiatric/Behavioral: Negative.      OBJECTIVE:    Physical Exam Vitals and nursing note reviewed.  Constitutional:      Appearance: She is obese. She is ill-appearing.  HENT:     Nose: Nose normal.     Mouth/Throat:     Mouth: Mucous membranes are dry.  Eyes:     Pupils: Pupils are equal, round, and reactive to light.  Cardiovascular:     Rate and Rhythm: Normal rate.  Pulmonary:     Effort: Pulmonary effort is normal.  Abdominal:     Palpations: Abdomen is soft.  Musculoskeletal:        General: Normal range of motion.     Cervical back: Normal range of motion.   Neurological:     Mental Status: She is alert.     BP (!) 142/95   Pulse 98   Ht 5\' 4"  (1.626 m)   Wt 234 lb 11.2 oz (106.5 kg)   BMI 40.29 kg/m  Wt Readings from  Last 3 Encounters:  10/24/20 234 lb 11.2 oz (106.5 kg)  04/30/20 208 lb 15.9 oz (94.8 kg)  03/26/20 209 lb (94.8 kg)    Health Maintenance Due  Topic Date Due  . Hepatitis C Screening  Never done  . COVID-19 Vaccine (1) Never done  . HIV Screening  Never done  . COLONOSCOPY  Never done  . INFLUENZA VACCINE  Never done    There are no preventive care reminders to display for this patient.  CBC Latest Ref Rng & Units 11/05/2019 09/22/2019 08/03/2019  WBC 4.0 - 10.5 K/uL 9.0 13.3(H) 10.1  Hemoglobin 12.0 - 15.0 g/dL 13.1 14.0 14.0  Hematocrit 36 - 46 % 39.2 41.2 41.0  Platelets 150 - 400 K/uL 296 288 290   CMP Latest Ref Rng & Units 11/05/2019 09/22/2019 08/03/2019  Glucose 70 - 99 mg/dL 103(H) 96 101(H)  BUN 6 - 20 mg/dL 17 14 22(H)  Creatinine 0.44 - 1.00 mg/dL 0.90 1.04(H) 1.08(H)  Sodium 135 - 145 mmol/L 139 140 142  Potassium 3.5 - 5.1 mmol/L 4.4 4.1 4.2  Chloride 98 - 111 mmol/L 107 105 109  CO2 22 - 32 mmol/L 25 24 25   Calcium 8.9 - 10.3 mg/dL 9.0 9.1 8.6(L)  Total Protein 6.5 - 8.1 g/dL 7.0 7.0 6.7  Total Bilirubin 0.3 - 1.2 mg/dL 0.5 0.5 0.4  Alkaline Phos 38 - 126 U/L 72 79 76  AST 15 - 41 U/L 17 18 18   ALT 0 - 44 U/L 16 13 18     No results found for: TSH Lab Results  Component Value Date   ALBUMIN 4.2 11/05/2019   ANIONGAP 7 11/05/2019   No results found for: CHOL, HDL, LDLCALC, CHOLHDL No results found for: TRIG No results found for: HGBA1C    ASSESSMENT & PLAN:   Problem List Items Addressed This Visit      Genitourinary   Acute cystitis without hematuria    Pt having chills, cough and dysuria x 3 days. No fever today. But she has chills and flank pain.      Relevant Medications   ciprofloxacin (CIPRO) 500 MG tablet     Other   Dysuria   Relevant Orders   POCT urinalysis  dipstick (Completed)   Suspected COVID-19 virus infection - Primary   Relevant Orders   POC COVID-19 (Completed)      Meds ordered this encounter  Medications  . ciprofloxacin (CIPRO) 500 MG tablet    Sig: Take 1 tablet (500 mg total) by mouth 2 (two) times daily.    Dispense:  20 tablet    Refill:  0      Follow-up: No follow-ups on file.    Beckie Salts, [place] [address], [place], [place] [address]

## 2020-10-24 NOTE — Assessment & Plan Note (Signed)
Pt having chills, cough and dysuria x 3 days. No fever today. But she has chills and flank pain.

## 2020-11-07 ENCOUNTER — Encounter: Payer: Self-pay | Admitting: Family Medicine

## 2020-11-07 ENCOUNTER — Ambulatory Visit (INDEPENDENT_AMBULATORY_CARE_PROVIDER_SITE_OTHER): Payer: Medicare HMO | Admitting: Family Medicine

## 2020-11-07 ENCOUNTER — Other Ambulatory Visit: Payer: Self-pay

## 2020-11-07 VITALS — BP 136/87 | HR 81 | Ht 64.0 in | Wt 238.6 lb

## 2020-11-07 DIAGNOSIS — R3915 Urgency of urination: Secondary | ICD-10-CM | POA: Diagnosis not present

## 2020-11-08 ENCOUNTER — Other Ambulatory Visit: Payer: Medicare HMO

## 2020-11-11 ENCOUNTER — Emergency Department: Payer: Medicare HMO

## 2020-11-11 ENCOUNTER — Other Ambulatory Visit: Payer: Self-pay

## 2020-11-11 ENCOUNTER — Emergency Department
Admission: EM | Admit: 2020-11-11 | Discharge: 2020-11-11 | Disposition: A | Discharge status: Home / Self Care | Payer: Medicare HMO | Attending: Emergency Medicine | Admitting: Emergency Medicine

## 2020-11-11 DIAGNOSIS — G8929 Other chronic pain: Secondary | ICD-10-CM

## 2020-11-11 DIAGNOSIS — M79662 Pain in left lower leg: Secondary | ICD-10-CM | POA: Diagnosis not present

## 2020-11-11 DIAGNOSIS — M79605 Pain in left leg: Secondary | ICD-10-CM

## 2020-11-11 DIAGNOSIS — F17210 Nicotine dependence, cigarettes, uncomplicated: Secondary | ICD-10-CM | POA: Insufficient documentation

## 2020-11-11 DIAGNOSIS — M545 Low back pain, unspecified: Secondary | ICD-10-CM | POA: Diagnosis not present

## 2020-11-11 DIAGNOSIS — N390 Urinary tract infection, site not specified: Secondary | ICD-10-CM | POA: Insufficient documentation

## 2020-11-11 DIAGNOSIS — M5416 Radiculopathy, lumbar region: Secondary | ICD-10-CM | POA: Insufficient documentation

## 2020-11-11 DIAGNOSIS — M549 Dorsalgia, unspecified: Secondary | ICD-10-CM | POA: Diagnosis not present

## 2020-11-11 DIAGNOSIS — M25552 Pain in left hip: Secondary | ICD-10-CM | POA: Diagnosis not present

## 2020-11-11 LAB — URINALYSIS, COMPLETE (UACMP) WITH MICROSCOPIC
Bilirubin Urine: NEGATIVE
Glucose, UA: NEGATIVE mg/dL
Hgb urine dipstick: NEGATIVE
Ketones, ur: NEGATIVE mg/dL
Nitrite: NEGATIVE
Protein, ur: NEGATIVE mg/dL
Specific Gravity, Urine: 1.003 — ABNORMAL LOW (ref 1.005–1.030)
pH: 7 (ref 5.0–8.0)

## 2020-11-11 LAB — POC URINE PREG, ED: Preg Test, Ur: NEGATIVE

## 2020-11-11 IMAGING — US US EXTREM LOW VENOUS*L*
1 series · 14 of 24 positions shown · non-contrast
Comparison: None.

CLINICAL DATA: Pain x1 week

EXAM:
LEFT LOWER EXTREMITY VENOUS DOPPLER ULTRASOUND
TECHNIQUE: Gray-scale sonography with compression, as well as color and duplex
ultrasound, were performed to evaluate the deep venous system(s)
from the level of the common femoral vein through the popliteal and
proximal calf veins.

[Series 1: us venous img lower uni left (dvt) · portal-venous · 14 of 34 slices shown]
[im 1/34]
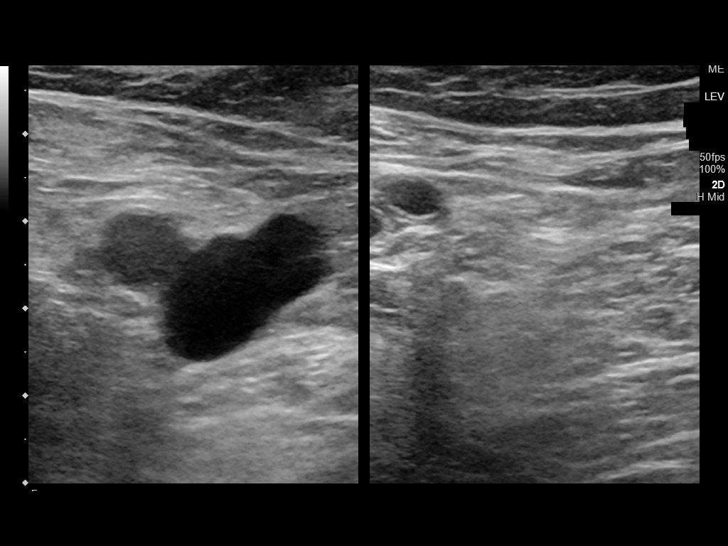
[im 3/34]
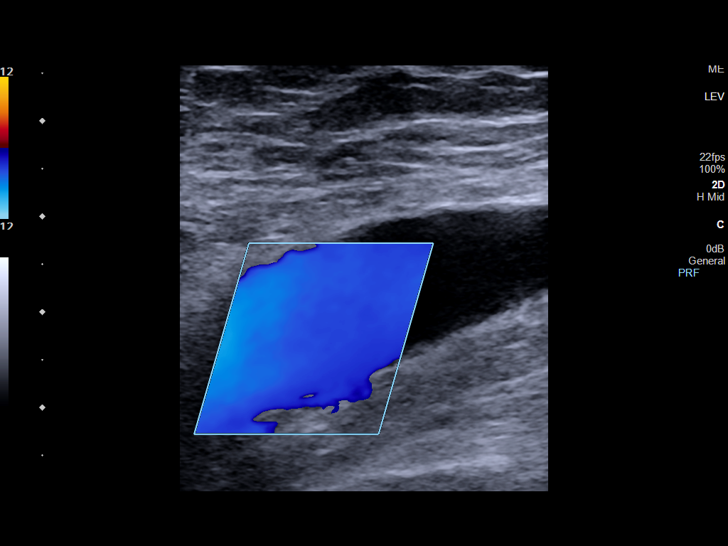
[im 6/34]
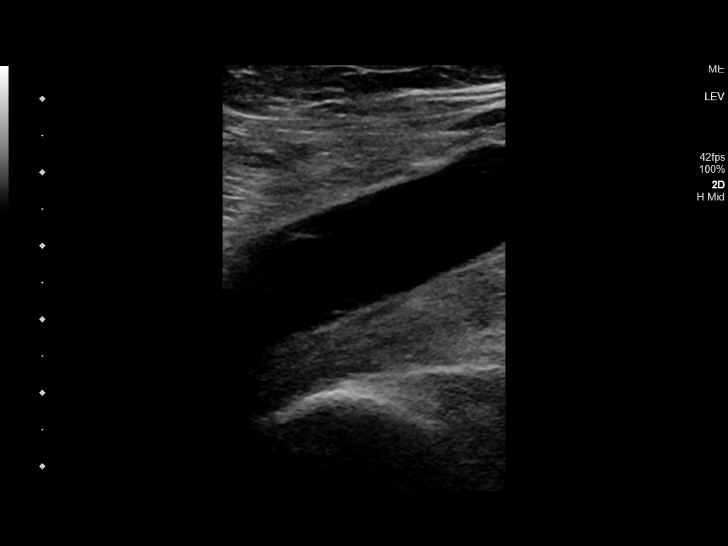
[im 9/34]
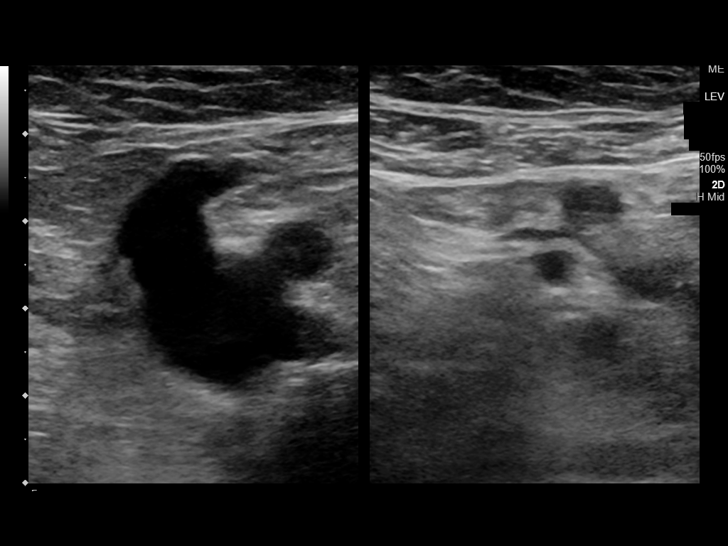
[im 11/34]
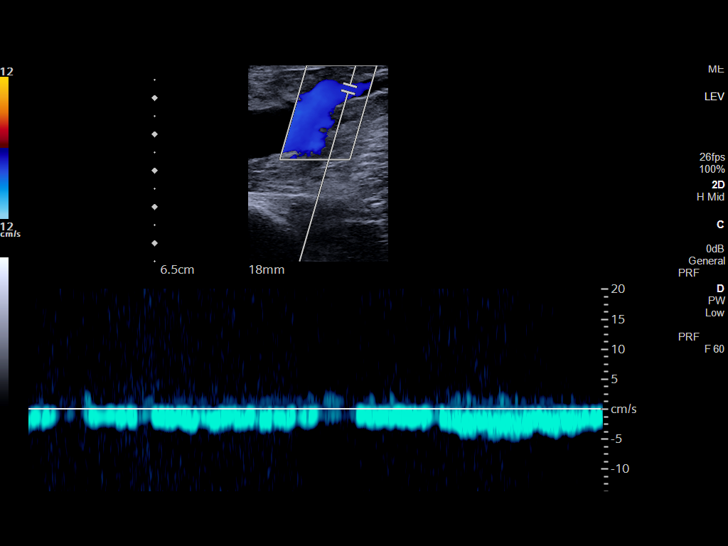
[im 13/34]
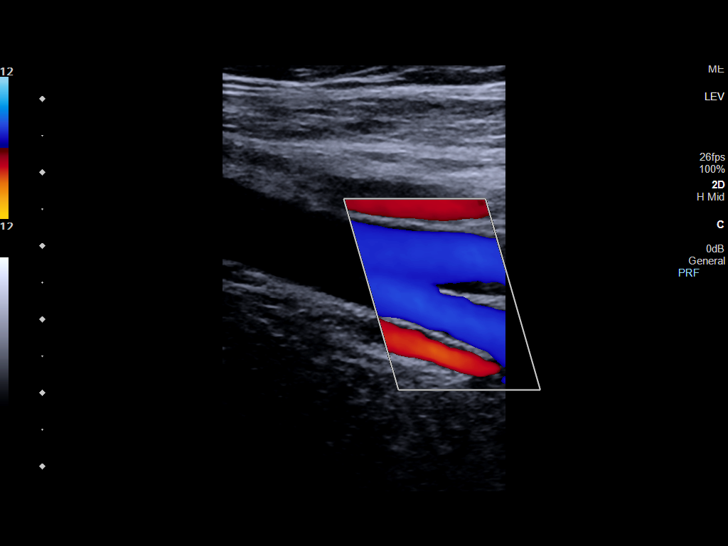
[im 16/34]
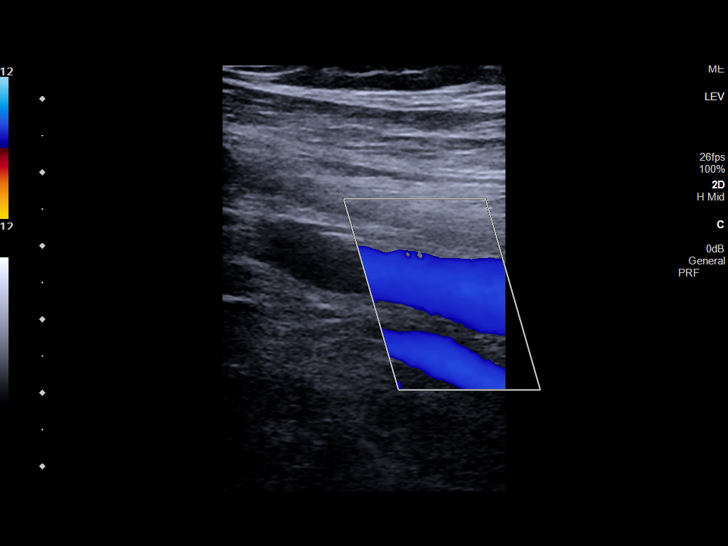
[im 18/34]
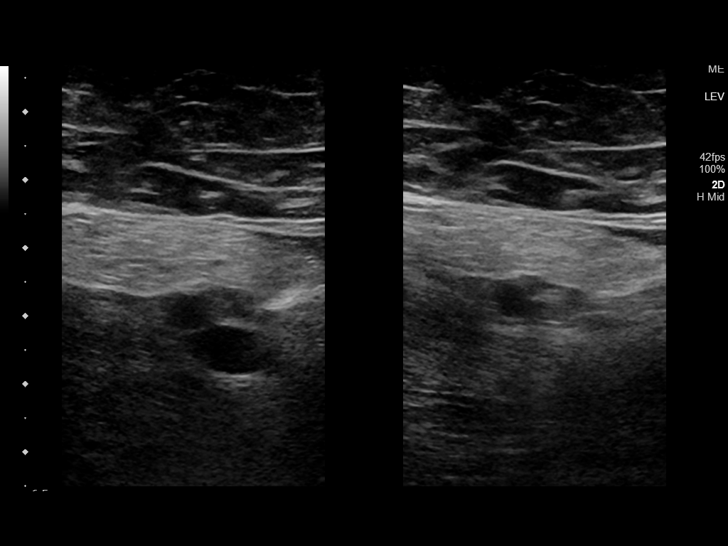
[im 21/34]
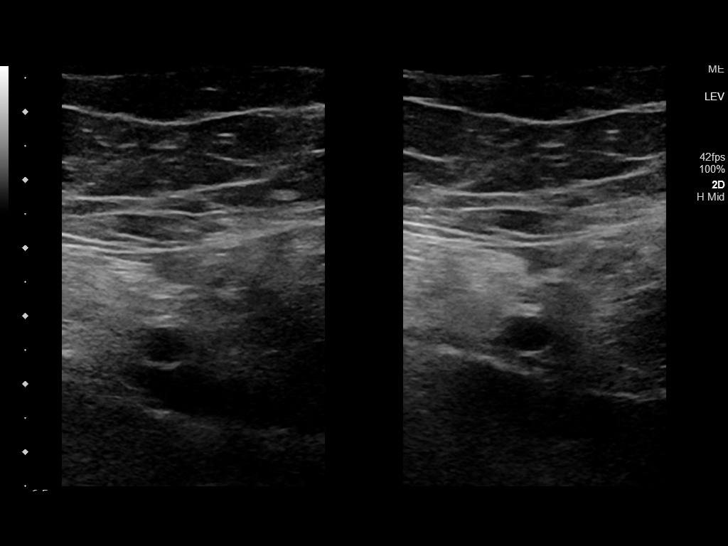
[im 23/34]
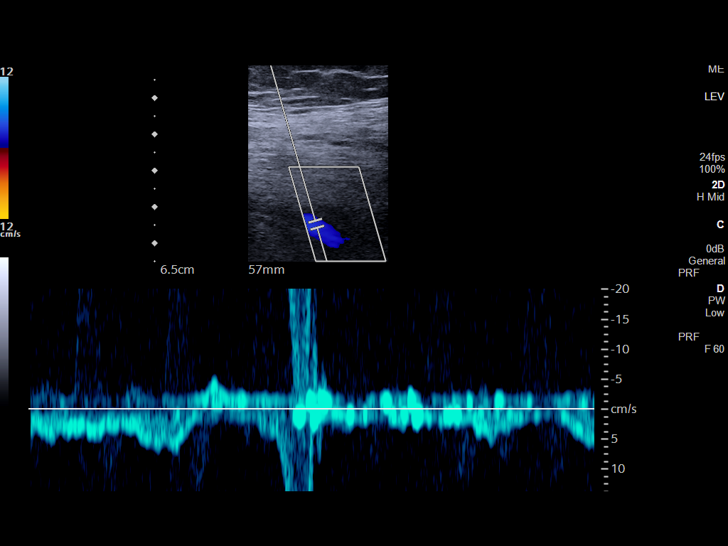
[im 26/34]
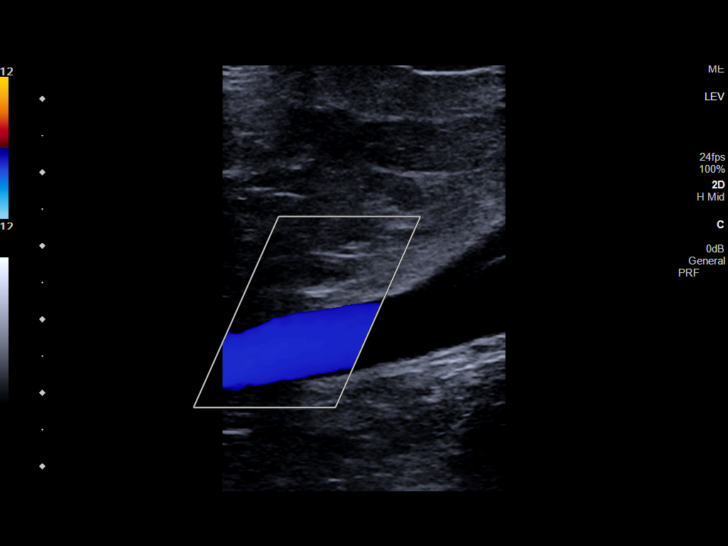
[im 28/34]
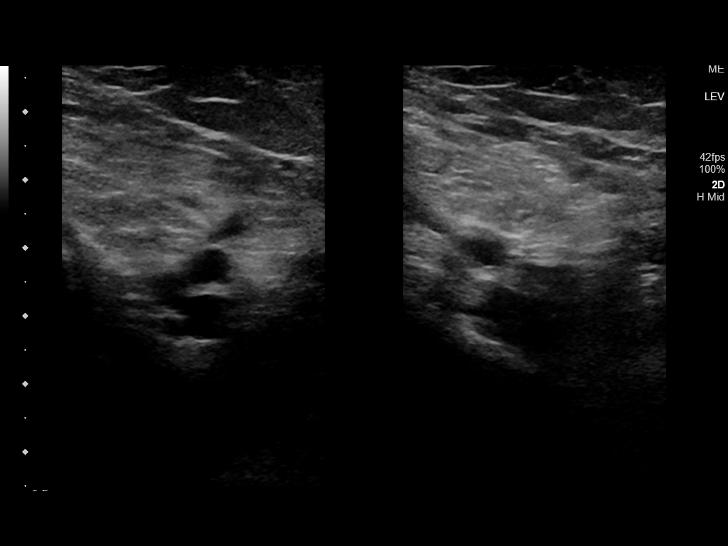
[im 31/34]
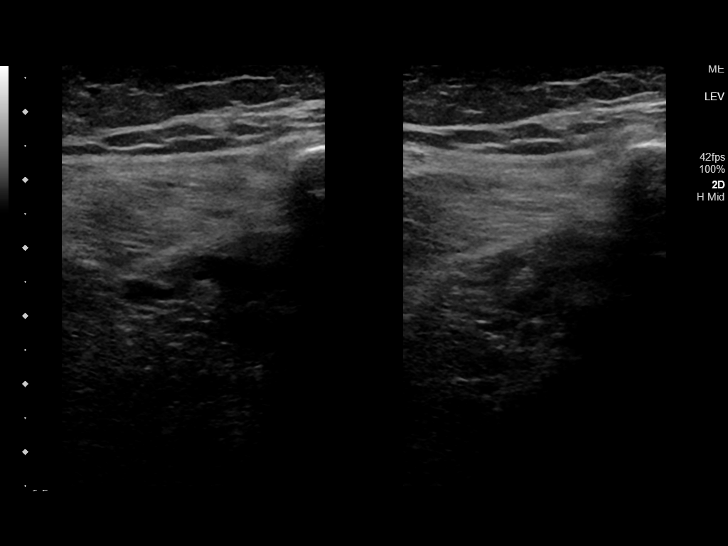
[im 34/34]
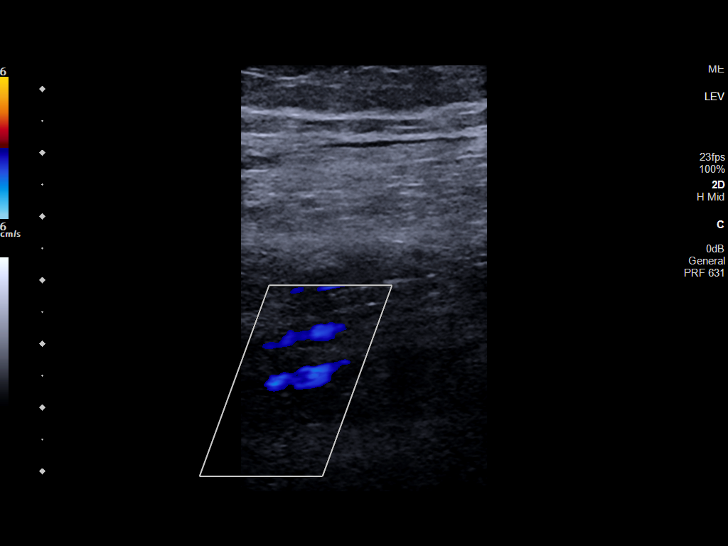

[14 of 24 positions shown; findings below may reference images not displayed]

FINDINGS: VENOUS

Normal compressibility of the common femoral, superficial femoral,
and popliteal veins, as well as the visualized calf veins.
Visualized portions of profunda femoral vein and great saphenous
vein unremarkable. No filling defects to suggest DVT on grayscale or
color Doppler imaging. Doppler waveforms show normal direction of
venous flow, normal respiratory phasicity and response to
augmentation.

Limited views of the contralateral common femoral vein are
unremarkable.

OTHER

None.

Limitations: none
IMPRESSION: No femoropopliteal DVT nor evidence of DVT within the visualized
calf veins.

If clinical symptoms are inconsistent or if there are persistent or
worsening symptoms, further imaging (possibly involving the iliac
veins) may be warranted.

## 2020-11-11 IMAGING — CR DG HIP (WITH OR WITHOUT PELVIS) 2-3V*L*
1 series · 3 of 3 positions shown · non-contrast
Comparison: None.

CLINICAL DATA: Back pain and left hip and leg pain.

EXAM:
DG HIP (WITH OR WITHOUT PELVIS) 2-3V LEFT

[Series 1: dg hip unilat w or w/o pelvis 2-3 views  · non-contrast · 0.14mm/px · 3 of 3 slices shown]
[im 1/3]
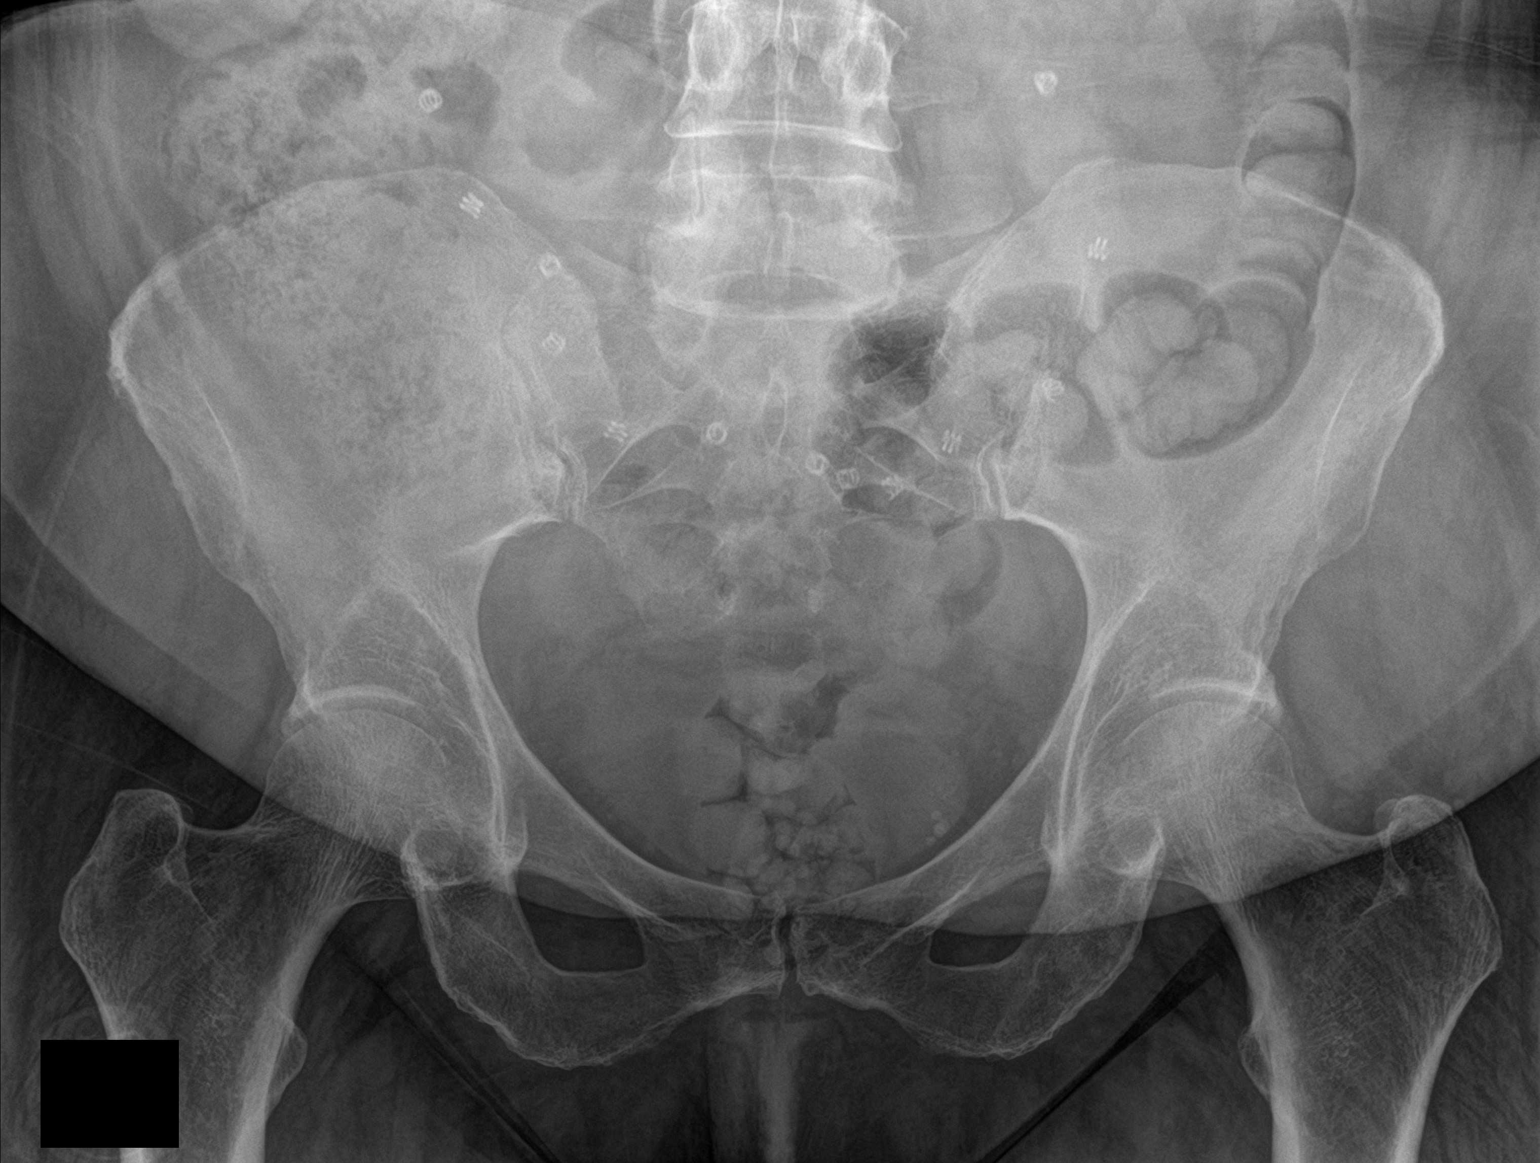
[im 2/3]
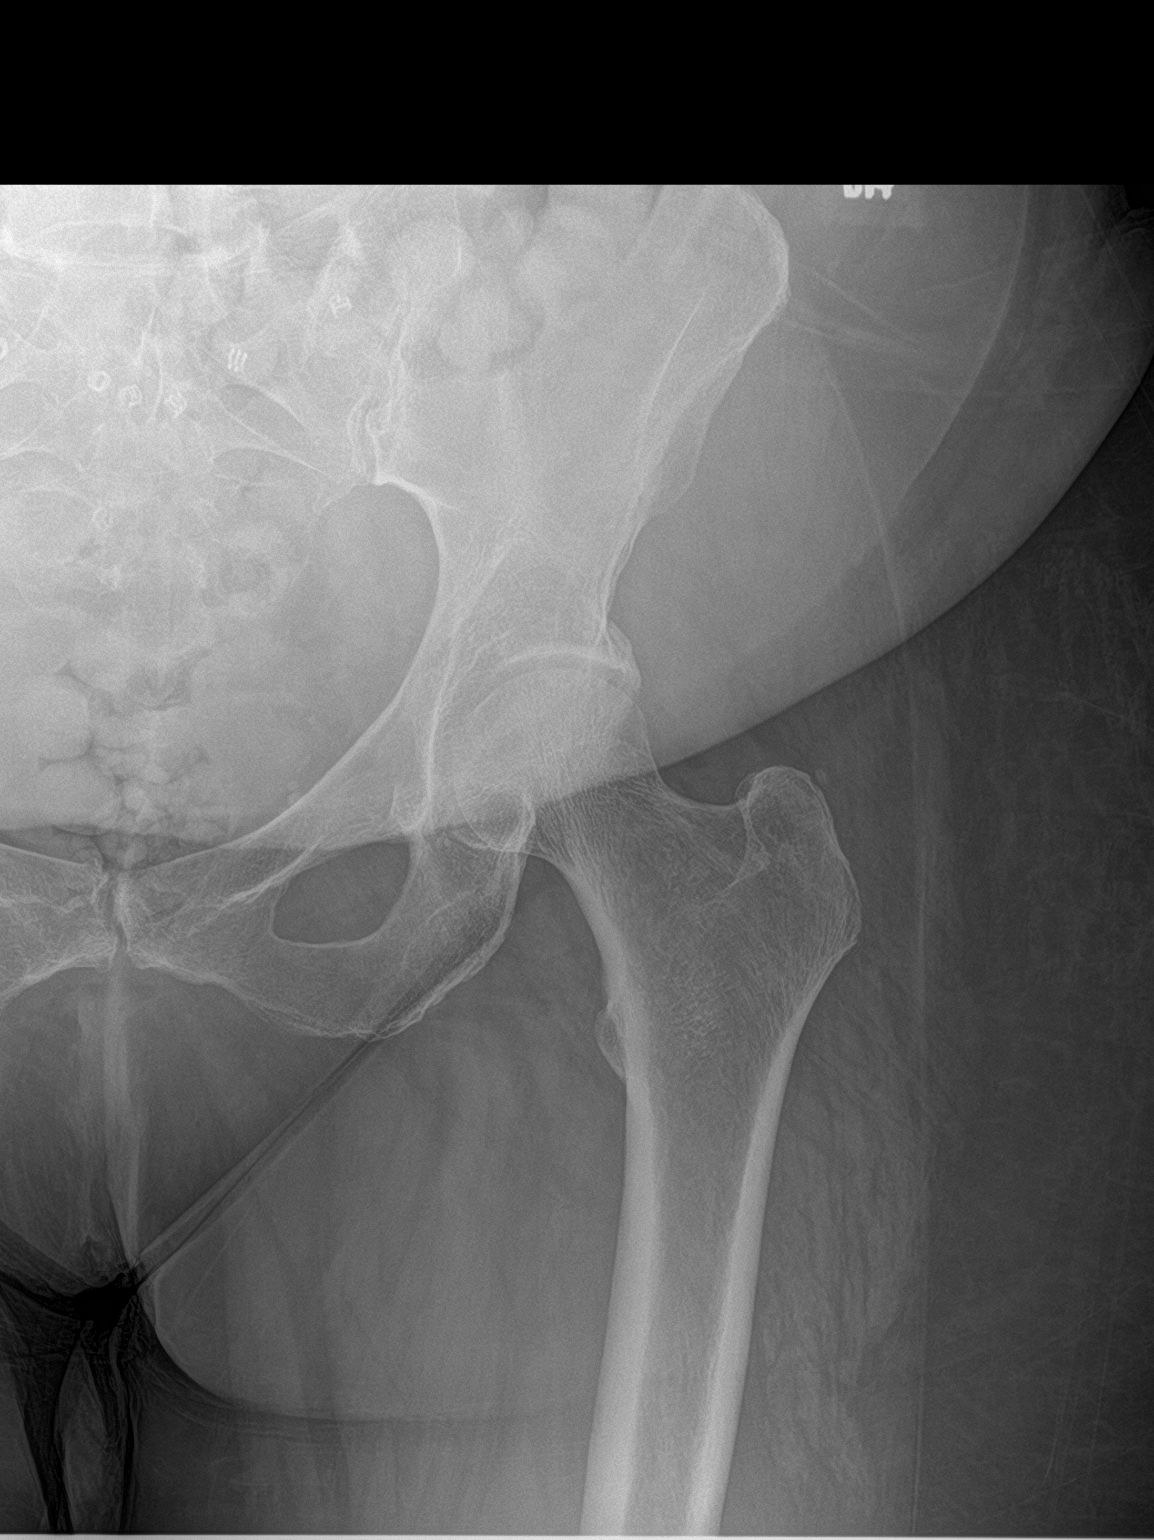
[im 3/3]
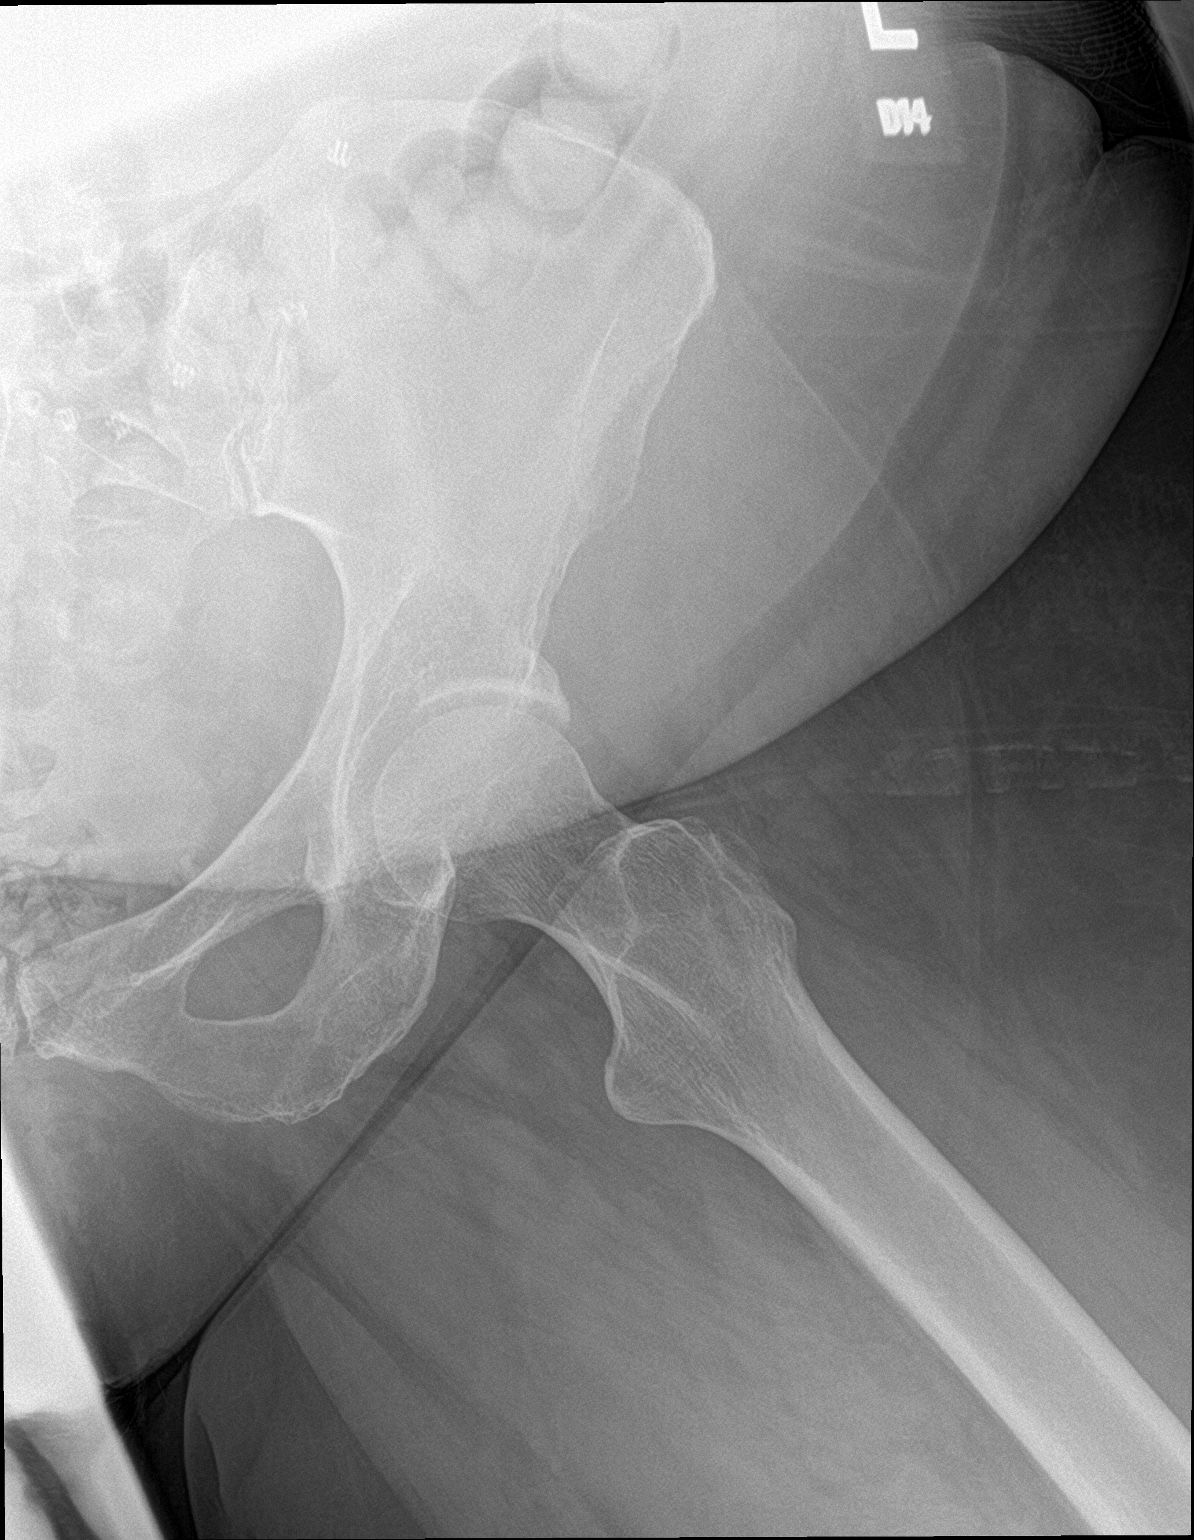

[3 of 3 positions shown; findings below may reference images not displayed]

FINDINGS: Bony pelvis appears normal. Both hip joints appear normal. No
evidence of osteoarthritis or avascular necrosis. Sacroiliac joints
and symphysis pubis appear normal.
IMPRESSION: Negative.

## 2020-11-11 IMAGING — CR DG LUMBAR SPINE 2-3V
1 series · 3 of 3 positions shown · non-contrast
Comparison: MRI [DATE]

CLINICAL DATA: Back pain.  Left leg pain.

EXAM:
LUMBAR SPINE - 2-3 VIEW

[Series 1: dg lumbar spine 2-3 views · 0.14mm/px · 3 of 3 slices shown]
[im 1/3]
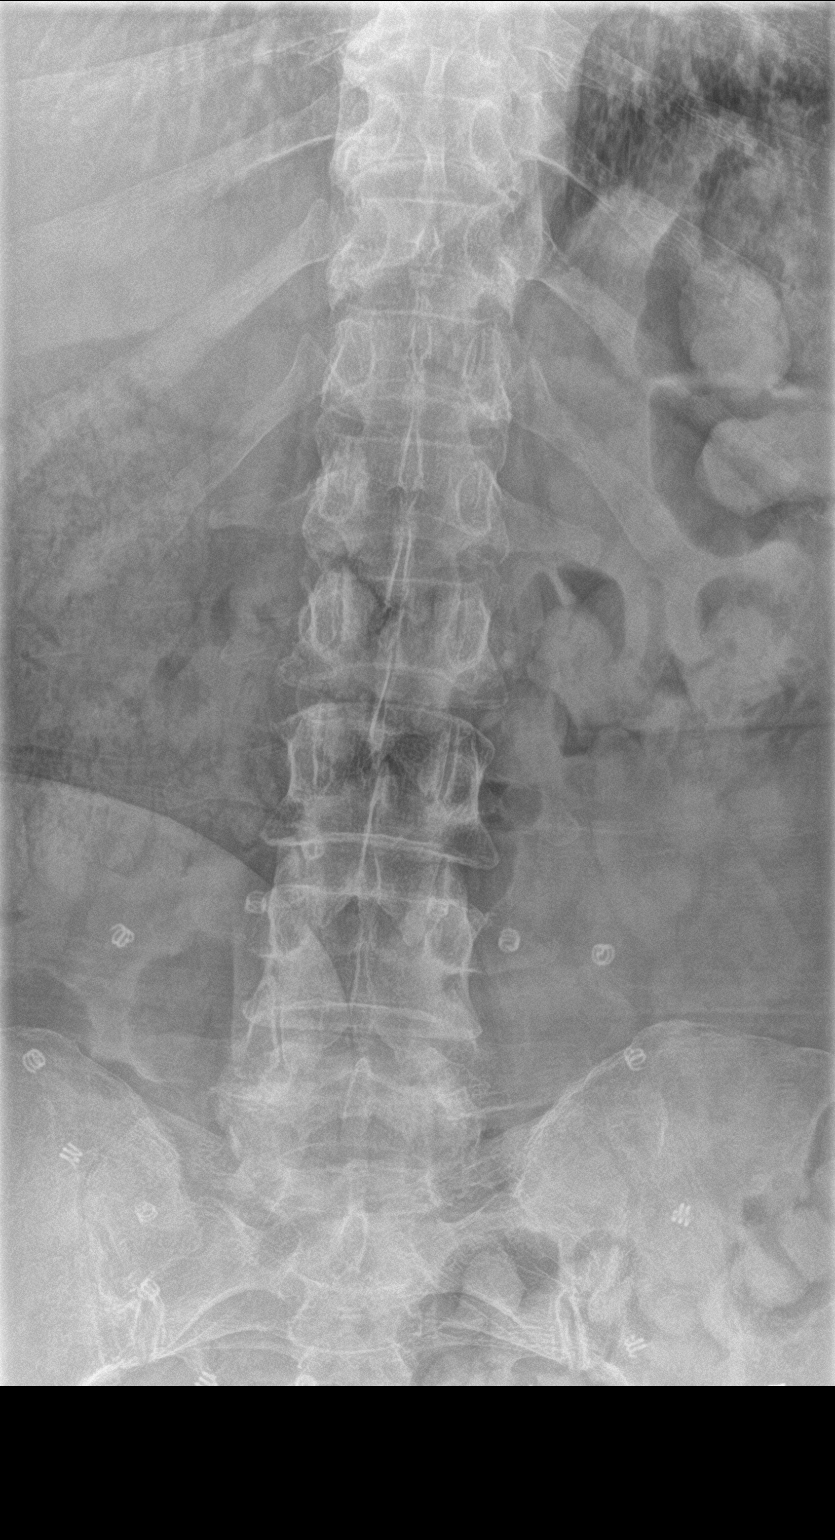
[im 2/3]
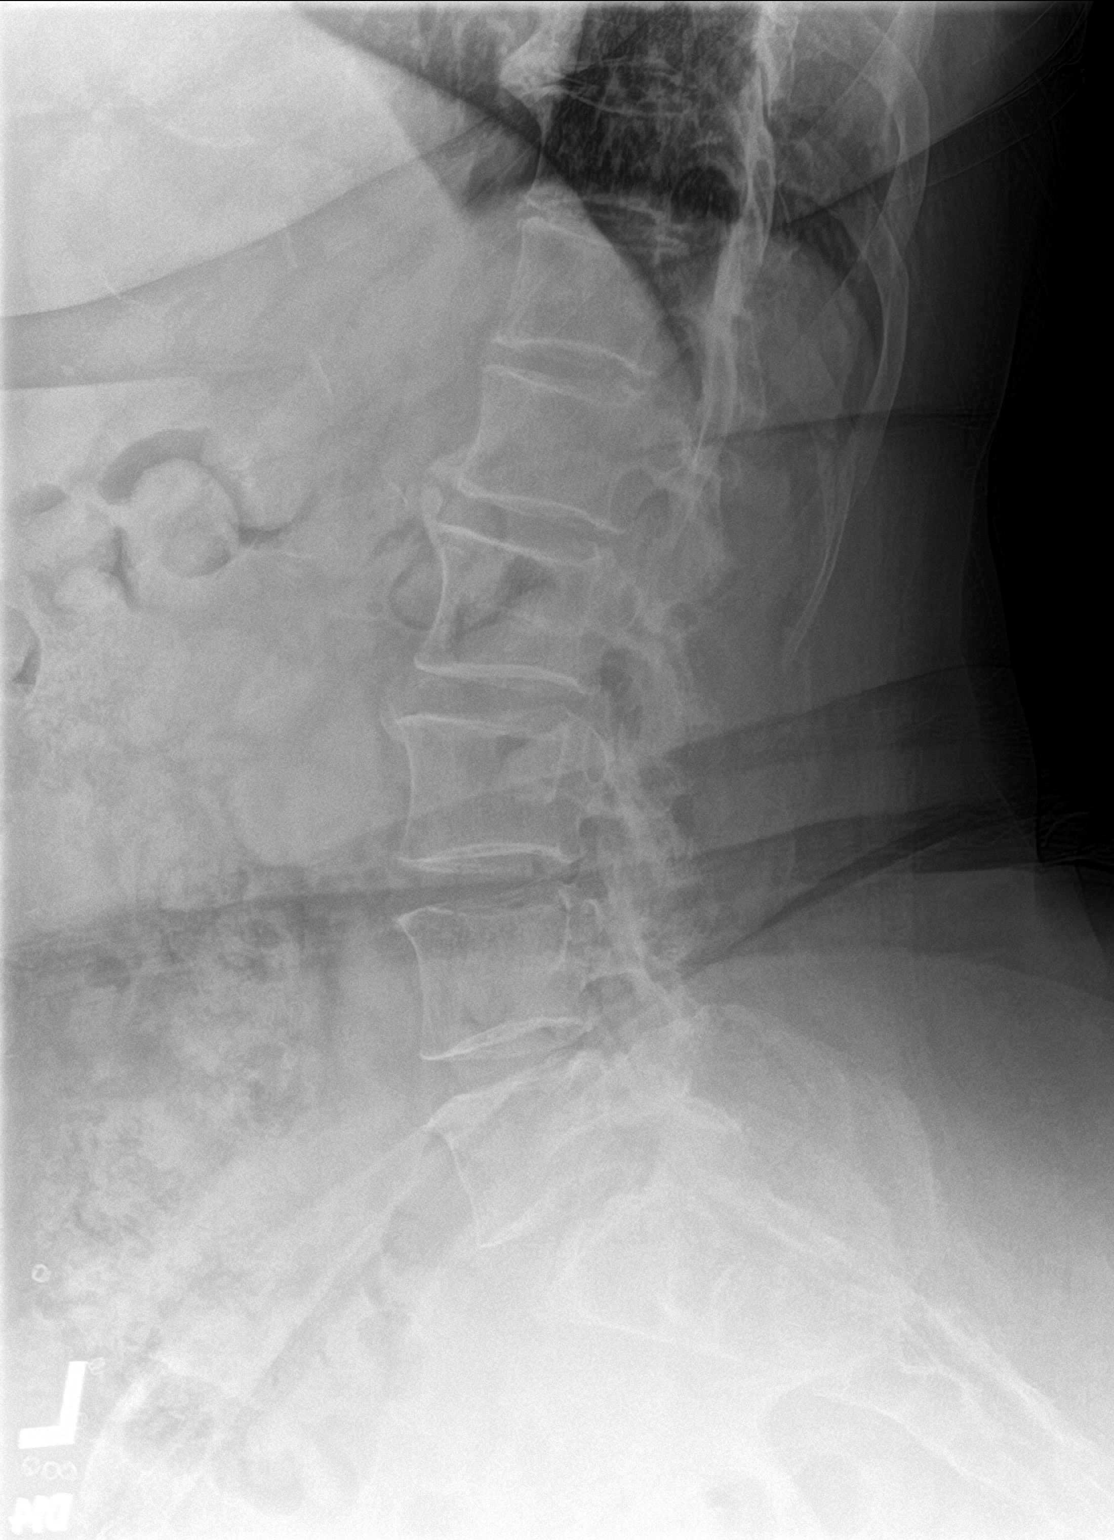
[im 3/3]
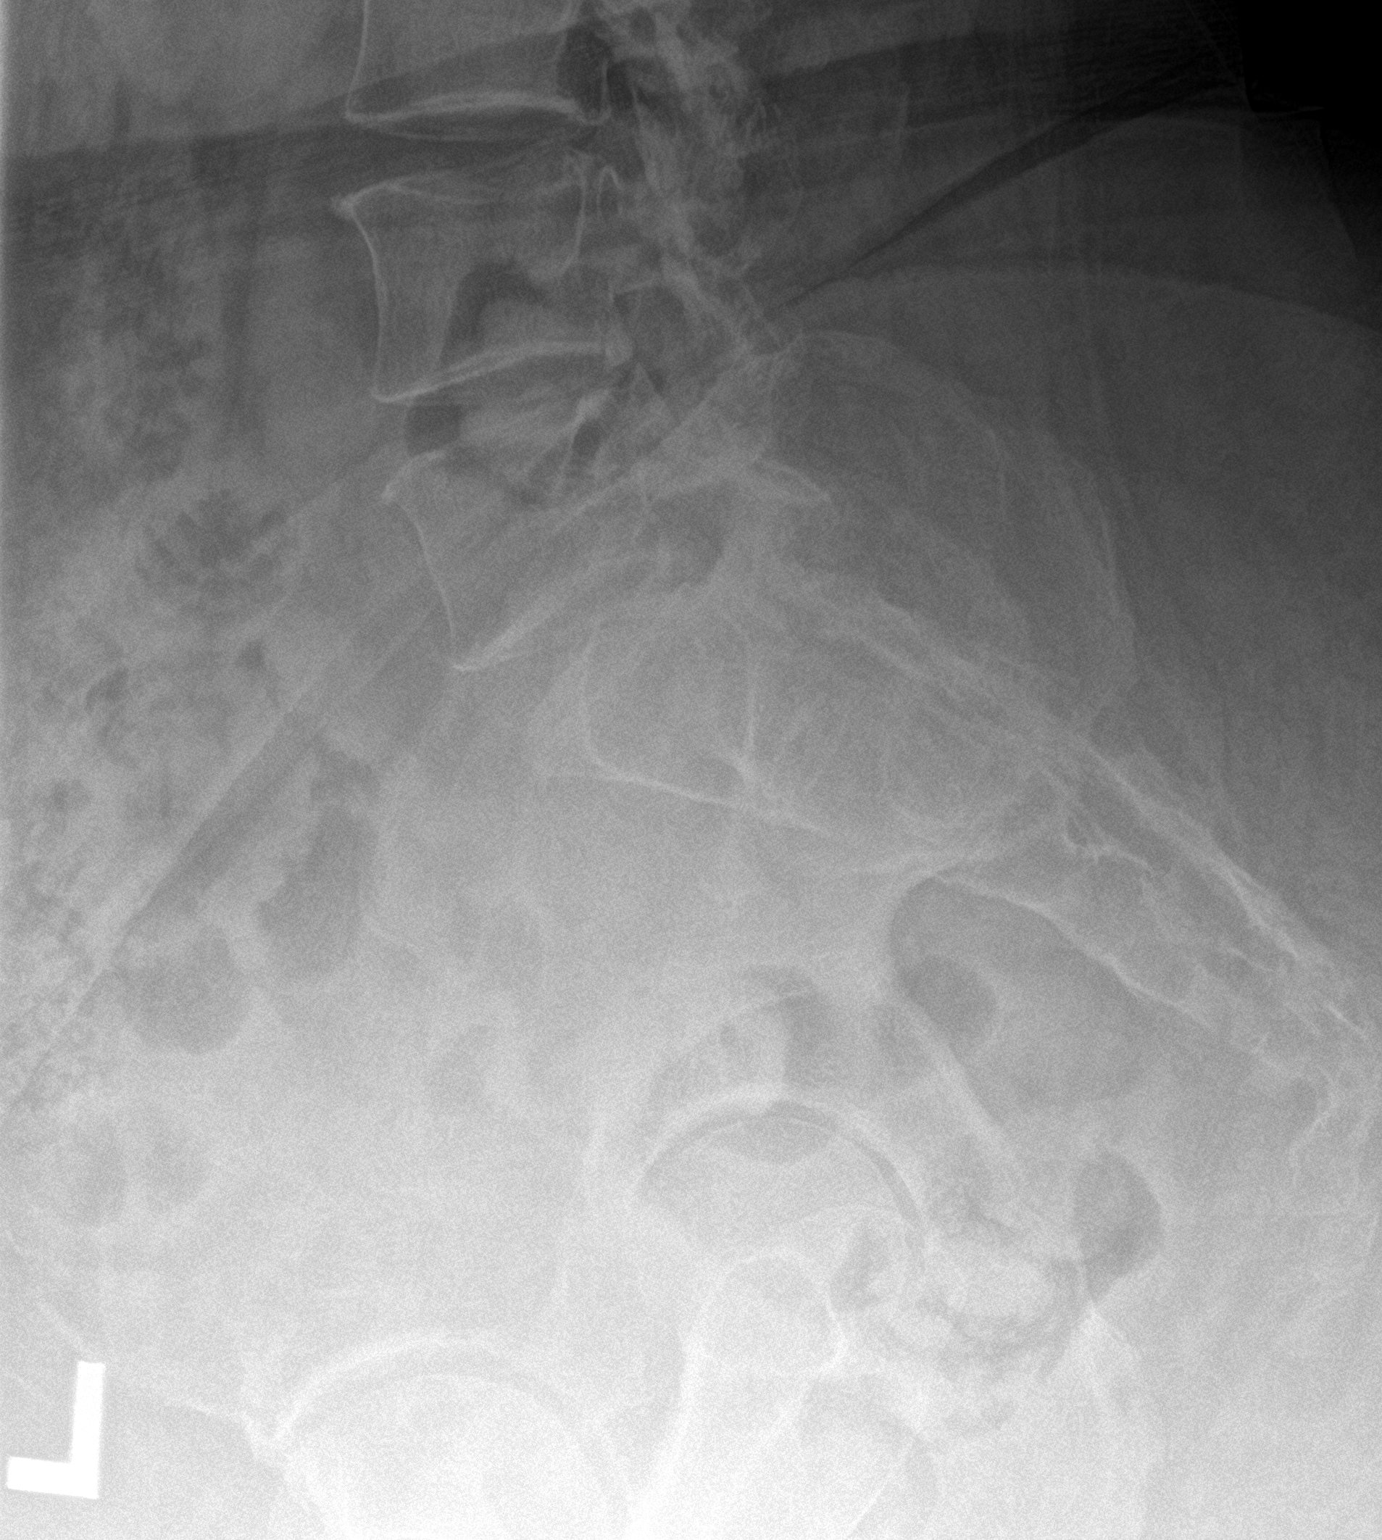

[3 of 3 positions shown; findings below may reference images not displayed]

FINDINGS: Five lumbar type vertebral bodies show normal alignment. Mild disc
space narrowing L4-5. Mild lower lumbar facet osteoarthritis. No
evidence of fracture or other focal finding.
IMPRESSION: Mild disc space narrowing L4-5. Mild lower lumbar facet
osteoarthritis.

## 2020-11-11 MED ORDER — KETOROLAC TROMETHAMINE 30 MG/ML IJ SOLN
30.0000 mg | Freq: Once | INTRAMUSCULAR | Status: AC
  Administered 2020-11-11: 30 mg via INTRAMUSCULAR
  Filled 2020-11-11: qty 1

## 2020-11-11 MED ORDER — LIDOCAINE 5 % EX PTCH
1.0000 | MEDICATED_PATCH | CUTANEOUS | Status: DC
  Administered 2020-11-11: 1 via TRANSDERMAL
  Filled 2020-11-11: qty 1

## 2020-11-11 MED ORDER — PREDNISONE 50 MG PO TABS: ORAL_TABLET | ORAL | 0 refills | Status: DC

## 2020-11-11 MED ORDER — MELOXICAM 15 MG PO TABS: 15.0000 mg | ORAL_TABLET | Freq: Every day | ORAL | 0 refills | Status: AC

## 2020-11-11 MED ORDER — PREDNISONE 20 MG PO TABS
60.0000 mg | ORAL_TABLET | Freq: Once | ORAL | Status: AC
  Administered 2020-11-11: 60 mg via ORAL
  Filled 2020-11-11: qty 3

## 2020-11-11 MED ORDER — LIDOCAINE 5 % EX PTCH: 1.0000 | MEDICATED_PATCH | Freq: Two times a day (BID) | CUTANEOUS | 0 refills | Status: DC

## 2020-11-11 MED ORDER — OXYCODONE-ACETAMINOPHEN 5-325 MG PO TABS
1.0000 | ORAL_TABLET | Freq: Once | ORAL | Status: AC
  Administered 2020-11-11: 1 via ORAL
  Filled 2020-11-11: qty 1

## 2020-11-11 MED ORDER — CYCLOBENZAPRINE HCL 5 MG PO TABS: ORAL_TABLET | ORAL | 0 refills | Status: DC

## 2020-11-11 MED ORDER — ORPHENADRINE CITRATE 30 MG/ML IJ SOLN
60.0000 mg | Freq: Two times a day (BID) | INTRAMUSCULAR | Status: DC
  Administered 2020-11-11: 60 mg via INTRAMUSCULAR
  Filled 2020-11-11: qty 2

## 2020-11-11 NOTE — ED Notes (Signed)
Pt given crackers and sprite.

## 2020-11-11 NOTE — ED Triage Notes (Signed)
Pt here with left leg pain that has been there for a while but has gotten worse. Pt states that she has been to her primary doctor for testing but it all has come back negative. Pt NAD in triage.

## 2020-11-11 NOTE — ED Notes (Signed)
See triage note: pt from home with left leg and hip pain. Pt states she has arthritis.

## 2020-11-11 NOTE — ED Provider Notes (Signed)
[facility] Emergency Department Provider Note  ____________________________________________  Time seen: Approximately 2:33 PM  I have reviewed the triage vital signs and the nursing notes.   HISTORY  Chief Complaint Leg Pain    HPI Kristen Hudson is a 54 y.o. female that presents to the emergency department for evaluation of low back pain that radiates into the side of her left leg for a while.  She has not taken any medications today for pain.  Patient states that she was diagnosed with a urinary tract infection at primary care a couple of weeks ago.  No bowel or bladder dysfunction or saddle anesthesias.  No trauma.  Patient sees Dr. Lacinda Axon with neurosurgery.  No fever, nausea, vomiting, abdominal pain, urinary symptoms, numbness, tingling.   Past Medical History:  Diagnosis Date  . Acid reflux   . Seizures [facility])     Patient Active Problem List   Diagnosis Date Noted  . Acute cystitis without hematuria 10/24/2020  . Dysuria 10/24/2020  . Suspected COVID-19 virus infection 10/24/2020  . Chronic pelvic pain in female 08/24/2018  . Status post bilateral salpingo-oophorectomy (BSO) 08/24/2018  . History of 3 cesarean sections 08/24/2018  . Vasomotor symptoms due to menopause 08/24/2018  . BMI 33.0-33.9,adult 08/24/2018  . Urinary urgency 08/24/2018  . Urinary frequency 08/24/2018    Past Surgical History:  Procedure Laterality Date  . APPENDECTOMY    . CESAREAN SECTION    . CHOLECYSTECTOMY    . COLON RESECTION    . OVARY SURGERY      Prior to Admission medications   Medication Sig Start Date End Date Taking? Authorizing Provider  Baclofen 5 MG TABS Take 1 tablet by mouth 3 (three) times daily as needed (Muscle spasms). 03/26/20   Johnn Hai, PA-C  ciprofloxacin (CIPRO) 500 MG tablet Take 1 tablet (500 mg total) by mouth 2 (two) times daily. 10/24/20   Beckie Salts, FNP  cyclobenzaprine (FLEXERIL) 5 MG tablet Take 1-2 tablets 3 times daily  as needed 11/11/20   Laban Emperor, PA-C  diazepam (VALIUM) 5 MG tablet Take 1 tablet (5 mg total) by mouth every 6 (six) hours as needed (vaginal valium). Patient taking differently: Take 5 mg by mouth every 6 (six) hours as needed.  12/13/18   Philip Aspen, CNM  gabapentin (NEURONTIN) 300 MG capsule Take 1 capsule (300 mg total) by mouth 2 (two) times daily. 09/13/20   Cletis Athens, MD  levETIRAcetam (KEPPRA) 500 MG tablet Take 1 tablet (500 mg total) by mouth 2 (two) times daily. 09/23/20   Cletis Athens, MD  lidocaine (LIDODERM) 5 % Place 1 patch onto the skin every 12 (twelve) hours. Remove & Discard patch within 12 hours or as directed by MD 11/11/20 11/11/21  Laban Emperor, PA-C  meloxicam (MOBIC) 15 MG tablet Take 1 tablet (15 mg total) by mouth daily for 10 days. 11/11/20 11/21/20  Laban Emperor, PA-C  methocarbamol (ROBAXIN) 500 MG tablet Take 1 tablet (500 mg total) by mouth 4 (four) times daily. 03/19/20   Cuthriell, Charline Bills, PA-C  oxyCODONE-acetaminophen (PERCOCET) 5-325 MG tablet Take 1 tablet by mouth every 6 (six) hours as needed for severe pain. 03/26/20   Johnn Hai, PA-C  phenytoin (DILANTIN) 100 MG ER capsule Take 200 mg by mouth 2 (two) times daily. 06/03/18   [provider]  phenytoin (DILANTIN) 125 MG/5ML suspension Take 125 mg by mouth 2 (two) times daily.     [provider]  predniSONE (DELTASONE) 50  MG tablet Take 1 tablet per day 11/11/20   Laban Emperor, PA-C  traMADol (ULTRAM) 50 MG tablet Take 1 tablet (50 mg total) by mouth every 6 (six) hours as needed for moderate pain. 04/30/20   Sable Feil, PA-C  traZODone (DESYREL) 50 MG tablet Take 1 tablet (50 mg total) by mouth daily. 09/10/20   Cletis Athens, MD    Allergies Penicillins  Family History  Problem Relation Age of Onset  . Breast cancer Mother 23  . Colon cancer Father   . Diabetes Father   . Ovarian cancer Neg Hx     Social History Social History   Tobacco Use  .  Smoking status: Current Every Day Smoker    Packs/day: 0.25    Types: Cigarettes  . Smokeless tobacco: Never Used  Vaping Use  . Vaping Use: Never used  Substance Use Topics  . Alcohol use: Not Currently  . Drug use: Not Currently     Review of Systems  Constitutional: No fever/chills ENT: No upper respiratory complaints. Cardiovascular: No chest pain. Respiratory: No cough. No SOB. Gastrointestinal: No abdominal pain.  No nausea, no vomiting.  Genitourinary: Negative for dysuria. Musculoskeletal: Positive for back and leg pain. Skin: Negative for rash, abrasions, lacerations, ecchymosis. Neurological: Negative for headaches, numbness or tingling   ____________________________________________   PHYSICAL EXAM:  VITAL SIGNS: ED Triage Vitals  Enc Vitals Group     BP 11/11/20 1130 132/81     Pulse Rate 11/11/20 1130 87     Resp 11/11/20 1130 18     Temp 11/11/20 1130 98 F (36.7 C)     Temp src --      SpO2 11/11/20 1130 96 %     Weight 11/11/20 1131 108 lb (49 kg)     Height 11/11/20 1131 5\' 4"  (1.626 m)     Head Circumference --      Peak Flow --      Pain Score --      Pain Loc --      Pain Edu? --      Excl. in [place]? --      Constitutional: Alert and oriented. Well appearing and in no acute distress. Eyes: Conjunctivae are normal. PERRL. EOMI. Head: Atraumatic. ENT:      Ears:      Nose: No congestion/rhinnorhea.      Mouth/Throat: Mucous membranes are moist.  Neck: No stridor.  Cardiovascular: Normal rate, regular rhythm.  Good peripheral circulation. Respiratory: Normal respiratory effort without tachypnea or retractions. Lungs CTAB. Good air entry to the bases with no decreased or absent breath sounds. Gastrointestinal: Bowel sounds 4 quadrants. Soft and nontender to palpation. No guarding or rigidity. No palpable masses. No distention.  Musculoskeletal: Full range of motion to all extremities. No gross deformities appreciated.  Diffuse tenderness to  palpation to lumbar spine and lumbar paraspinal muscles.  Full range of motion of left hip.  Able to flex at the left hip and the left ankle while standing.  Able to move all 10 toes.  Slow gait. Neurologic:  Normal speech and language. No gross focal neurologic deficits are appreciated.  Skin:  Skin is warm, dry and intact. No rash noted. Psychiatric: Mood and affect are normal. Speech and behavior are normal. Patient exhibits appropriate insight and judgement.   ____________________________________________   LABS (all labs ordered are listed, but only abnormal results are displayed)  Labs Reviewed  URINALYSIS, COMPLETE (UACMP) WITH MICROSCOPIC - Abnormal; Notable for the following  components:      Result Value   Color, Urine STRAW (*)    APPearance CLEAR (*)    Specific Gravity, Urine 1.003 (*)    Leukocytes,Ua TRACE (*)    Bacteria, UA RARE (*)    All other components within normal limits  POC URINE PREG, ED   ____________________________________________  EKG   ____________________________________________  RADIOLOGY Robinette Haines, personally viewed and evaluated these images (plain radiographs) as part of my medical decision making, as well as reviewing the written report by the radiologist.  DG Lumbar Spine 2-3 Views  Result Date: 11/11/2020 CLINICAL DATA:  Back pain.  Left leg pain. EXAM: LUMBAR SPINE - 2-3 VIEW COMPARISON:  MRI 03/31/2020 FINDINGS: Five lumbar type vertebral bodies show normal alignment. Mild disc space narrowing L4-5. Mild lower lumbar facet osteoarthritis. No evidence of fracture or other focal finding. IMPRESSION: Mild disc space narrowing L4-5. Mild lower lumbar facet osteoarthritis. Electronically Signed   By: Nelson Chimes M.D.   On: 11/11/2020 14:34   US Venous Img Lower Unilateral Left (DVT)  Result Date: 11/11/2020 CLINICAL DATA:  Pain x1 week EXAM: LEFT LOWER EXTREMITY VENOUS DOPPLER ULTRASOUND TECHNIQUE: Gray-scale sonography with  compression, as well as color and duplex ultrasound, were performed to evaluate the deep venous system(s) from the level of the common femoral vein through the popliteal and proximal calf veins. COMPARISON:  None. FINDINGS: VENOUS Normal compressibility of the common femoral, superficial femoral, and popliteal veins, as well as the visualized calf veins. Visualized portions of profunda femoral vein and great saphenous vein unremarkable. No filling defects to suggest DVT on grayscale or color Doppler imaging. Doppler waveforms show normal direction of venous flow, normal respiratory phasicity and response to augmentation. Limited views of the contralateral common femoral vein are unremarkable. OTHER None. Limitations: none IMPRESSION: No femoropopliteal DVT nor evidence of DVT within the visualized calf veins. If clinical symptoms are inconsistent or if there are persistent or worsening symptoms, further imaging (possibly involving the iliac veins) may be warranted. Electronically Signed   By: Lucrezia Europe M.D.   On: 11/11/2020 12:26   DG Hip Unilat W or Wo Pelvis 2-3 Views Left  Result Date: 11/11/2020 CLINICAL DATA:  Back pain and left hip and leg pain. EXAM: DG HIP (WITH OR WITHOUT PELVIS) 2-3V LEFT COMPARISON:  None. FINDINGS: Bony pelvis appears normal. Both hip joints appear normal. No evidence of osteoarthritis or avascular necrosis. Sacroiliac joints and symphysis pubis appear normal. IMPRESSION: Negative. Electronically Signed   By: Nelson Chimes M.D.   On: 11/11/2020 14:35    ____________________________________________    PROCEDURES  Procedure(s) performed:    Procedures    Medications  orphenadrine (NORFLEX) injection 60 mg (60 mg Intramuscular Given 11/11/20 1434)  lidocaine (LIDODERM) 5 % 1 patch (1 patch Transdermal Patch Applied 11/11/20 1434)  ketorolac (TORADOL) 30 MG/ML injection 30 mg (has no administration in time range)  predniSONE (DELTASONE) tablet 60 mg (has no  administration in time range)  oxyCODONE-acetaminophen (PERCOCET/ROXICET) 5-325 MG per tablet 1 tablet (1 tablet Oral Given 11/11/20 1434)     ____________________________________________   INITIAL IMPRESSION / ASSESSMENT AND PLAN / ED COURSE  Pertinent labs & imaging results that were available during my care of the patient were reviewed by me and considered in my medical decision making (see chart for details).  Review of the [place] CSRS was performed in accordance of the [place] prior to dispensing any controlled drugs.     Patient's diagnosis is consistent  with lumbar radiculopathy.  Vital signs and exam are reassuring.  Lumbar x-ray shows chronic changes with no acute bony abnormality.  No DVT on ultrasound.  Pain significantly improved following Norflex, Percocet, Toradol.  Patient is ambulating without assistance.  Patient will be discharged home with prescriptions for Mobic, Flexeril, Lidoderm, prednisone. Patient is to follow up with primary care or neurosurgery as directed. Patient is given ED precautions to return to the ED for any worsening or new symptoms.   Gabreille Retz was evaluated in Emergency Department on 11/11/2020 for the symptoms described in the history of present illness. She was evaluated in the context of the global COVID-19 pandemic, which necessitated consideration that the patient might be at risk for infection with the SARS-CoV-2 virus that causes COVID-19. Institutional protocols and algorithms that pertain to the evaluation of patients at risk for COVID-19 are in a state of rapid change based on information released by regulatory bodies including the CDC and federal and state organizations. These policies and algorithms were followed during the patient's care in the ED.  ____________________________________________  FINAL CLINICAL IMPRESSION(S) / ED DIAGNOSES  Final diagnoses:  Chronic left-sided low back pain, unspecified whether sciatica present  Lumbar  radiculopathy      NEW MEDICATIONS STARTED DURING THIS VISIT:  ED Discharge Orders         Ordered    meloxicam (MOBIC) 15 MG tablet  Daily        11/11/20 1513    cyclobenzaprine (FLEXERIL) 5 MG tablet        11/11/20 1513    lidocaine (LIDODERM) 5 %  Every 12 hours        11/11/20 1513    predniSONE (DELTASONE) 50 MG tablet        11/11/20 1513              This chart was dictated using voice recognition software/Dragon. Despite best efforts to proofread, errors can occur which can change the meaning. Any change was purely unintentional.    Laban Emperor, PA-C 11/12/20 1116    Arta Silence, MD 11/12/20 1515

## 2020-11-12 LAB — URINE CULTURE

## 2020-11-18 ENCOUNTER — Encounter: Payer: Self-pay | Admitting: Family Medicine

## 2020-11-18 NOTE — Assessment & Plan Note (Signed)
Urinary frequency x 3 days with dysuria, Plan- start abx and f/u as needed for UTI.

## 2020-11-18 NOTE — Progress Notes (Addendum)
Established Patient Office Visit  SUBJECTIVE:  Subjective  Patient ID: Kristen Hudson, female    DOB: 10-13-1966  Age: 54 y.o. MRN: [id number]  CC:  Chief Complaint  Patient presents with  . Leg Pain    HPI Kristen Hudson is a 54 y.o. female presenting today for     Past Medical History:  Diagnosis Date  . Acid reflux   . Seizures ([place])     Past Surgical History:  Procedure Laterality Date  . APPENDECTOMY    . CESAREAN SECTION    . CHOLECYSTECTOMY    . COLON RESECTION    . OVARY SURGERY      Family History  Problem Relation Age of Onset  . Breast cancer Mother 70  . Colon cancer Father   . Diabetes Father   . Ovarian cancer Neg Hx     Social History   Socioeconomic History  . Marital status: Single    Spouse name: Not on file  . Number of children: Not on file  . Years of education: Not on file  . Highest education level: Not on file  Occupational History  . Not on file  Tobacco Use  . Smoking status: Current Every Day Smoker    Packs/day: 0.25    Types: Cigarettes  . Smokeless tobacco: Never Used  Vaping Use  . Vaping Use: Never used  Substance and Sexual Activity  . Alcohol use: Not Currently  . Drug use: Not Currently  . Sexual activity: Not Currently  Other Topics Concern  . Not on file  Social History Narrative  . Not on file   Social Determinants of Health   Financial Resource Strain:   . Difficulty of Paying Living Expenses: Not on file  Food Insecurity:   . Worried About Charity fundraiser in the Last Year: Not on file  . Ran Out of Food in the Last Year: Not on file  Transportation Needs:   . Lack of Transportation (Medical): Not on file  . Lack of Transportation (Non-Medical): Not on file  Physical Activity:   . Days of Exercise per Week: Not on file  . Minutes of Exercise per Session: Not on file  Stress:   . Feeling of Stress : Not on file  Social Connections:   . Frequency of Communication with Friends and Family: Not on file   . Frequency of Social Gatherings with Friends and Family: Not on file  . Attends Religious Services: Not on file  . Active Member of Clubs or Organizations: Not on file  . Attends Archivist Meetings: Not on file  . Marital Status: Not on file  Intimate Partner Violence:   . Fear of Current or Ex-Partner: Not on file  . Emotionally Abused: Not on file  . Physically Abused: Not on file  . Sexually Abused: Not on file     Current Outpatient Medications:  .  Baclofen 5 MG TABS, Take 1 tablet by mouth 3 (three) times daily as needed (Muscle spasms)., Disp: 15 tablet, Rfl: 0 .  ciprofloxacin (CIPRO) 500 MG tablet, Take 1 tablet (500 mg total) by mouth 2 (two) times daily., Disp: 20 tablet, Rfl: 0 .  diazepam (VALIUM) 5 MG tablet, Take 1 tablet (5 mg total) by mouth every 6 (six) hours as needed (vaginal valium). (Patient taking differently: Take 5 mg by mouth every 6 (six) hours as needed. ), Disp: 90 tablet, Rfl: 2 .  gabapentin (NEURONTIN) 300 MG capsule, Take 1 capsule (  300 mg total) by mouth 2 (two) times daily., Disp: 180 capsule, Rfl: 3 .  levETIRAcetam (KEPPRA) 500 MG tablet, Take 1 tablet (500 mg total) by mouth 2 (two) times daily., Disp: 180 tablet, Rfl: 3 .  methocarbamol (ROBAXIN) 500 MG tablet, Take 1 tablet (500 mg total) by mouth 4 (four) times daily., Disp: 16 tablet, Rfl: 0 .  oxyCODONE-acetaminophen (PERCOCET) 5-325 MG tablet, Take 1 tablet by mouth every 6 (six) hours as needed for severe pain., Disp: 15 tablet, Rfl: 0 .  phenytoin (DILANTIN) 100 MG ER capsule, Take 200 mg by mouth 2 (two) times daily., Disp: , Rfl:  .  phenytoin (DILANTIN) 125 MG/5ML suspension, Take 125 mg by mouth 2 (two) times daily. , Disp: , Rfl:  .  traMADol (ULTRAM) 50 MG tablet, Take 1 tablet (50 mg total) by mouth every 6 (six) hours as needed for moderate pain., Disp: 12 tablet, Rfl: 0 .  traZODone (DESYREL) 50 MG tablet, Take 1 tablet (50 mg total) by mouth daily., Disp: 90 tablet, Rfl:  3 .  cyclobenzaprine (FLEXERIL) 5 MG tablet, Take 1-2 tablets 3 times daily as needed, Disp: 20 tablet, Rfl: 0 .  lidocaine (LIDODERM) 5 %, Place 1 patch onto the skin every 12 (twelve) hours. Remove & Discard patch within 12 hours or as directed by MD, Disp: 10 patch, Rfl: 0 .  meloxicam (MOBIC) 15 MG tablet, Take 1 tablet (15 mg total) by mouth daily for 10 days., Disp: 10 tablet, Rfl: 0 .  predniSONE (DELTASONE) 50 MG tablet, Take 1 tablet per day, Disp: 5 tablet, Rfl: 0   Allergies  Allergen Reactions  . Penicillins Anaphylaxis    Did it involve swelling of the face/tongue/throat, SOB, or low BP? No Did it involve sudden or severe rash/hives, skin peeling, or any reaction on the inside of your mouth or nose? No Did you need to seek medical attention at a hospital or doctor's office? No When did it last happen?teenager If all above answers are "NO", may proceed with cephalosporin use.     ROS Review of Systems  Constitutional: Positive for chills.  Eyes: Negative.   Respiratory: Negative.   Genitourinary: Positive for urgency.  Skin: Negative.   Psychiatric/Behavioral: Negative.      OBJECTIVE:    Physical Exam HENT:     Nose: Nose normal.  Eyes:     Pupils: Pupils are equal, round, and reactive to light.  Cardiovascular:     Rate and Rhythm: Normal rate and regular rhythm.  Pulmonary:     Effort: Pulmonary effort is normal.  Abdominal:     General: Abdomen is flat.  Musculoskeletal:        General: Normal range of motion.  Skin:    General: Skin is warm.  Neurological:     General: No focal deficit present.     Mental Status: She is alert.  Psychiatric:        Mood and Affect: Mood normal.     BP 136/87   Pulse 81   Ht 5\' 4"  (1.626 m)   Wt 238 lb 9.6 oz (108.2 kg)   BMI 40.96 kg/m  Wt Readings from Last 3 Encounters:  11/11/20 108 lb (49 kg)  11/07/20 238 lb 9.6 oz (108.2 kg)  10/24/20 234 lb 11.2 oz (106.5 kg)    Health Maintenance Due   Topic Date Due  . Hepatitis C Screening  Never done  . COVID-19 Vaccine (1) Never done  . HIV Screening  Never done  . COLONOSCOPY  Never done  . INFLUENZA VACCINE  Never done    There are no preventive care reminders to display for this patient.  CBC Latest Ref Rng & Units 11/05/2019 09/22/2019 08/03/2019  WBC 4.0 - 10.5 K/uL 9.0 13.3(H) 10.1  Hemoglobin 12.0 - 15.0 g/dL 13.1 14.0 14.0  Hematocrit 36 - 46 % 39.2 41.2 41.0  Platelets 150 - 400 K/uL 296 288 290   CMP Latest Ref Rng & Units 11/05/2019 09/22/2019 08/03/2019  Glucose 70 - 99 mg/dL 103(H) 96 101(H)  BUN 6 - 20 mg/dL 17 14 22(H)  Creatinine 0.44 - 1.00 mg/dL 0.90 1.04(H) 1.08(H)  Sodium 135 - 145 mmol/L 139 140 142  Potassium 3.5 - 5.1 mmol/L 4.4 4.1 4.2  Chloride 98 - 111 mmol/L 107 105 109  CO2 22 - 32 mmol/L 25 24 25   Calcium 8.9 - 10.3 mg/dL 9.0 9.1 8.6(L)  Total Protein 6.5 - 8.1 g/dL 7.0 7.0 6.7  Total Bilirubin 0.3 - 1.2 mg/dL 0.5 0.5 0.4  Alkaline Phos 38 - 126 U/L 72 79 76  AST 15 - 41 U/L 17 18 18   ALT 0 - 44 U/L 16 13 18     No results found for: TSH Lab Results  Component Value Date   ALBUMIN 4.2 11/05/2019   ANIONGAP 7 11/05/2019   No results found for: CHOL, HDL, LDLCALC, CHOLHDL No results found for: TRIG No results found for: HGBA1C    ASSESSMENT & PLAN:   Problem List Items Addressed This Visit      Other   Urinary urgency - Primary    Urinary frequency x 3 days with dysuria, Plan- start abx and f/u as needed for UTI.          No orders of the defined types were placed in this encounter.     Follow-up: No follow-ups on file.    Beckie Salts, [place] [address] [place], [place] [address]

## 2020-11-26 DIAGNOSIS — G8929 Other chronic pain: Secondary | ICD-10-CM | POA: Diagnosis not present

## 2020-11-26 DIAGNOSIS — M48062 Spinal stenosis, lumbar region with neurogenic claudication: Secondary | ICD-10-CM | POA: Diagnosis not present

## 2020-11-26 DIAGNOSIS — M5442 Lumbago with sciatica, left side: Secondary | ICD-10-CM | POA: Diagnosis not present

## 2020-11-27 ENCOUNTER — Other Ambulatory Visit: Payer: Self-pay | Admitting: Family Medicine

## 2020-12-24 DIAGNOSIS — M5442 Lumbago with sciatica, left side: Secondary | ICD-10-CM | POA: Diagnosis not present

## 2020-12-24 DIAGNOSIS — G8929 Other chronic pain: Secondary | ICD-10-CM | POA: Diagnosis not present

## 2020-12-24 DIAGNOSIS — M48062 Spinal stenosis, lumbar region with neurogenic claudication: Secondary | ICD-10-CM | POA: Diagnosis not present

## 2020-12-30 DIAGNOSIS — J019 Acute sinusitis, unspecified: Secondary | ICD-10-CM | POA: Diagnosis not present

## 2020-12-30 DIAGNOSIS — B9689 Other specified bacterial agents as the cause of diseases classified elsewhere: Secondary | ICD-10-CM | POA: Diagnosis not present

## 2020-12-30 DIAGNOSIS — J209 Acute bronchitis, unspecified: Secondary | ICD-10-CM | POA: Diagnosis not present

## 2020-12-30 DIAGNOSIS — K029 Dental caries, unspecified: Secondary | ICD-10-CM | POA: Diagnosis not present

## 2021-01-06 DIAGNOSIS — M5442 Lumbago with sciatica, left side: Secondary | ICD-10-CM | POA: Diagnosis not present

## 2021-01-06 DIAGNOSIS — M48062 Spinal stenosis, lumbar region with neurogenic claudication: Secondary | ICD-10-CM | POA: Diagnosis not present

## 2021-02-04 DIAGNOSIS — G8929 Other chronic pain: Secondary | ICD-10-CM | POA: Diagnosis not present

## 2021-02-04 DIAGNOSIS — M5442 Lumbago with sciatica, left side: Secondary | ICD-10-CM | POA: Diagnosis not present

## 2021-02-04 DIAGNOSIS — M48062 Spinal stenosis, lumbar region with neurogenic claudication: Secondary | ICD-10-CM | POA: Diagnosis not present

## 2021-03-12 ENCOUNTER — Encounter: Payer: Self-pay | Admitting: Family Medicine

## 2021-03-12 ENCOUNTER — Ambulatory Visit (INDEPENDENT_AMBULATORY_CARE_PROVIDER_SITE_OTHER): Payer: Medicare Other | Admitting: Family Medicine

## 2021-03-12 ENCOUNTER — Other Ambulatory Visit: Payer: Self-pay

## 2021-03-12 VITALS — BP 126/86 | HR 96 | Ht 64.0 in | Wt 234.0 lb

## 2021-03-12 DIAGNOSIS — M25561 Pain in right knee: Secondary | ICD-10-CM

## 2021-03-12 DIAGNOSIS — Z1231 Encounter for screening mammogram for malignant neoplasm of breast: Secondary | ICD-10-CM

## 2021-03-12 LAB — URIC ACID: Uric Acid, Serum: 6.5 mg/dL (ref 2.5–7.0)

## 2021-03-12 NOTE — Addendum Note (Signed)
Addended by: Alois Cliche on: 03/12/2021 11:47 AM   Modules accepted: Orders

## 2021-03-12 NOTE — Addendum Note (Signed)
Addended by: Anson [place] R on: 03/12/2021 11:45 AM   Modules accepted: Orders

## 2021-03-12 NOTE — Progress Notes (Signed)
Established Patient Office Visit  SUBJECTIVE:  Subjective  Patient ID: Kristen Hudson, female    DOB: 07/03/1966  Age: 55 y.o. MRN: [id number]  CC:  Chief Complaint  Patient presents with  . Leg Pain    Patient complains of pain in both legs, difficulty walking and bearing weight. The pain is a throbbing constant pain, she has taken tylenol for the pain and no relief.  . Fall    Patient states she fell out of her bed last Thursday and is having pain on her left breast. Difficulty lifting her left arm.    HPI Kristen Hudson is a 55 y.o. female presenting today for     Past Medical History:  Diagnosis Date  . Acid reflux   . Seizures ([place])     Past Surgical History:  Procedure Laterality Date  . APPENDECTOMY    . CESAREAN SECTION    . CHOLECYSTECTOMY    . COLON RESECTION    . OVARY SURGERY      Family History  Problem Relation Age of Onset  . Breast cancer Mother 6  . Colon cancer Father   . Diabetes Father   . Ovarian cancer Neg Hx     Social History   Socioeconomic History  . Marital status: Single    Spouse name: Not on file  . Number of children: Not on file  . Years of education: Not on file  . Highest education level: Not on file  Occupational History  . Not on file  Tobacco Use  . Smoking status: Current Every Day Smoker    Packs/day: 0.25    Types: Cigarettes  . Smokeless tobacco: Never Used  Vaping Use  . Vaping Use: Never used  Substance and Sexual Activity  . Alcohol use: Not Currently  . Drug use: Not Currently  . Sexual activity: Not Currently  Other Topics Concern  . Not on file  Social History Narrative  . Not on file   Social Determinants of Health   Financial Resource Strain: Not on file  Food Insecurity: Not on file  Transportation Needs: Not on file  Physical Activity: Not on file  Stress: Not on file  Social Connections: Not on file  Intimate Partner Violence: Not on file     Current Outpatient Medications:  .  diazepam  (VALIUM) 5 MG tablet, Take 1 tablet (5 mg total) by mouth every 6 (six) hours as needed (vaginal valium). (Patient taking differently: Take 5 mg by mouth every 6 (six) hours as needed.), Disp: 90 tablet, Rfl: 2 .  gabapentin (NEURONTIN) 300 MG capsule, Take 1 capsule (300 mg total) by mouth 2 (two) times daily., Disp: 180 capsule, Rfl: 3 .  methocarbamol (ROBAXIN) 500 MG tablet, Take 1 tablet (500 mg total) by mouth 4 (four) times daily., Disp: 16 tablet, Rfl: 0 .  phenytoin (DILANTIN) 125 MG/5ML suspension, Take 125 mg by mouth 2 (two) times daily. , Disp: , Rfl:  .  traZODone (DESYREL) 50 MG tablet, Take 1 tablet (50 mg total) by mouth daily., Disp: 90 tablet, Rfl: 3   Allergies  Allergen Reactions  . Penicillins Anaphylaxis    Did it involve swelling of the face/tongue/throat, SOB, or low BP? No Did it involve sudden or severe rash/hives, skin peeling, or any reaction on the inside of your mouth or nose? No Did you need to seek medical attention at a hospital or doctor's office? No When did it last happen?teenager If all above answers are "NO",  may proceed with cephalosporin use.     ROS Review of Systems  Constitutional: Negative.   HENT: Negative.   Respiratory: Negative.   Cardiovascular: Negative.   Genitourinary: Negative.   Musculoskeletal: Positive for arthralgias, gait problem and joint swelling.  Psychiatric/Behavioral: Negative.      OBJECTIVE:    Physical Exam Vitals and nursing note reviewed.  Constitutional:      Appearance: She is obese.  HENT:     Head: Normocephalic and atraumatic.     Nose: Nose normal.  Cardiovascular:     Rate and Rhythm: Normal rate and regular rhythm.  Abdominal:     General: Abdomen is flat.  Musculoskeletal:     Right knee: Swelling (Mild swelling) and erythema present. Tenderness present over the medial joint line.     Instability Tests: Anterior drawer test negative. Posterior drawer test negative.     Left knee: Normal.   Psychiatric:        Mood and Affect: Mood normal.     BP 126/86   Pulse 96   Ht 5\' 4"  (1.626 m)   Wt 234 lb (106.1 kg)   BMI 40.17 kg/m  Wt Readings from Last 3 Encounters:  03/12/21 234 lb (106.1 kg)  11/11/20 108 lb (49 kg)  11/07/20 238 lb 9.6 oz (108.2 kg)    Health Maintenance Due  Topic Date Due  . Hepatitis C Screening  Never done  . COVID-19 Vaccine (1) Never done  . HIV Screening  Never done  . COLONOSCOPY (Pts 45-89yrs Insurance coverage will need to be confirmed)  Never done  . INFLUENZA VACCINE  Never done    There are no preventive care reminders to display for this patient.  CBC Latest Ref Rng & Units 11/05/2019 09/22/2019 08/03/2019  WBC 4.0 - 10.5 K/uL 9.0 13.3(H) 10.1  Hemoglobin 12.0 - 15.0 g/dL 13.1 14.0 14.0  Hematocrit 36.0 - 46.0 % 39.2 41.2 41.0  Platelets 150 - 400 K/uL 296 288 290   CMP Latest Ref Rng & Units 11/05/2019 09/22/2019 08/03/2019  Glucose 70 - 99 mg/dL 103(H) 96 101(H)  BUN 6 - 20 mg/dL 17 14 22(H)  Creatinine 0.44 - 1.00 mg/dL 0.90 1.04(H) 1.08(H)  Sodium 135 - 145 mmol/L 139 140 142  Potassium 3.5 - 5.1 mmol/L 4.4 4.1 4.2  Chloride 98 - 111 mmol/L 107 105 109  CO2 22 - 32 mmol/L 25 24 25   Calcium 8.9 - 10.3 mg/dL 9.0 9.1 8.6(L)  Total Protein 6.5 - 8.1 g/dL 7.0 7.0 6.7  Total Bilirubin 0.3 - 1.2 mg/dL 0.5 0.5 0.4  Alkaline Phos 38 - 126 U/L 72 79 76  AST 15 - 41 U/L 17 18 18   ALT 0 - 44 U/L 16 13 18     No results found for: TSH Lab Results  Component Value Date   ALBUMIN 4.2 11/05/2019   ANIONGAP 7 11/05/2019   No results found for: CHOL, HDL, LDLCALC, CHOLHDL No results found for: TRIG No results found for: HGBA1C    ASSESSMENT & PLAN:   Problem List Items Addressed This Visit      Other   Acute pain of right knee - Primary    R knee is mildly red appearing without erythema. No excessive fluid that I can see.   Plan- Draw Uric acid level today to confirm suspicion of gout. If Uric acid level is WNL, will bring  patient back in 2 days to discuss results and possible knee injection. Until Friday take  Ibuprofen 600 mg q 8 hours.          No orders of the defined types were placed in this encounter.     Follow-up: No follow-ups on file.    Beckie Salts, [place] [address] [place], [place] [address]

## 2021-03-12 NOTE — Assessment & Plan Note (Addendum)
R knee is mildly red appearing without erythema. No excessive fluid that I can see.   Plan- Draw Uric acid level today to confirm suspicion of gout. If Uric acid level is WNL, will bring patient back in 2 days to discuss results and possible knee injection. Until Friday take Ibuprofen 600 mg q 8 hours.

## 2021-03-14 ENCOUNTER — Encounter: Payer: Self-pay | Admitting: Family Medicine

## 2021-03-14 ENCOUNTER — Other Ambulatory Visit: Payer: Self-pay

## 2021-03-14 ENCOUNTER — Ambulatory Visit (INDEPENDENT_AMBULATORY_CARE_PROVIDER_SITE_OTHER): Payer: Medicare Other | Admitting: Family Medicine

## 2021-03-14 VITALS — BP 134/82 | HR 78 | Ht 64.0 in | Wt 232.4 lb

## 2021-03-14 DIAGNOSIS — M25561 Pain in right knee: Secondary | ICD-10-CM

## 2021-03-14 NOTE — Progress Notes (Signed)
Established Patient Office Visit  SUBJECTIVE:  Subjective  Patient ID: Kristen Hudson, female    DOB: August 08, 1966  Age: 54 y.o. MRN: [id number]  CC:  Chief Complaint  Patient presents with  . Leg Pain    HPI Kristen Hudson is a 55 y.o. female presenting today for    Past Medical History:  Diagnosis Date  . Acid reflux   . Seizures ([place])     Past Surgical History:  Procedure Laterality Date  . APPENDECTOMY    . CESAREAN SECTION    . CHOLECYSTECTOMY    . COLON RESECTION    . OVARY SURGERY      Family History  Problem Relation Age of Onset  . Breast cancer Mother 44  . Colon cancer Father   . Diabetes Father   . Ovarian cancer Neg Hx     Social History   Socioeconomic History  . Marital status: Single    Spouse name: Not on file  . Number of children: Not on file  . Years of education: Not on file  . Highest education level: Not on file  Occupational History  . Not on file  Tobacco Use  . Smoking status: Current Every Day Smoker    Packs/day: 0.25    Types: Cigarettes  . Smokeless tobacco: Never Used  Vaping Use  . Vaping Use: Never used  Substance and Sexual Activity  . Alcohol use: Not Currently  . Drug use: Not Currently  . Sexual activity: Not Currently  Other Topics Concern  . Not on file  Social History Narrative  . Not on file   Social Determinants of Health   Financial Resource Strain: Not on file  Food Insecurity: Not on file  Transportation Needs: Not on file  Physical Activity: Not on file  Stress: Not on file  Social Connections: Not on file  Intimate Partner Violence: Not on file     Current Outpatient Medications:  .  diazepam (VALIUM) 5 MG tablet, Take 1 tablet (5 mg total) by mouth every 6 (six) hours as needed (vaginal valium). (Patient taking differently: Take 5 mg by mouth every 6 (six) hours as needed.), Disp: 90 tablet, Rfl: 2 .  gabapentin (NEURONTIN) 300 MG capsule, Take 1 capsule (300 mg total) by mouth 2 (two) times  daily., Disp: 180 capsule, Rfl: 3 .  methocarbamol (ROBAXIN) 500 MG tablet, Take 1 tablet (500 mg total) by mouth 4 (four) times daily., Disp: 16 tablet, Rfl: 0 .  phenytoin (DILANTIN) 125 MG/5ML suspension, Take 125 mg by mouth 2 (two) times daily. , Disp: , Rfl:  .  traZODone (DESYREL) 50 MG tablet, Take 1 tablet (50 mg total) by mouth daily., Disp: 90 tablet, Rfl: 3   Allergies  Allergen Reactions  . Penicillins Anaphylaxis    Did it involve swelling of the face/tongue/throat, SOB, or low BP? No Did it involve sudden or severe rash/hives, skin peeling, or any reaction on the inside of your mouth or nose? No Did you need to seek medical attention at a hospital or doctor's office? No When did it last happen?teenager If all above answers are "NO", may proceed with cephalosporin use.     ROS Review of Systems  Constitutional: Negative.   Respiratory: Negative.   Cardiovascular: Negative.   Musculoskeletal: Positive for arthralgias and joint swelling.  Neurological: Negative.   Psychiatric/Behavioral: Negative.      OBJECTIVE:    Physical Exam Vitals and nursing note reviewed.  Cardiovascular:  Rate and Rhythm: Normal rate and regular rhythm.  Neurological:     Mental Status: She is alert.  Psychiatric:        Mood and Affect: Mood normal.     BP 134/82   Pulse 78   Ht 5\' 4"  (1.626 m)   Wt 232 lb 6.4 oz (105.4 kg)   BMI 39.89 kg/m  Wt Readings from Last 3 Encounters:  03/14/21 232 lb 6.4 oz (105.4 kg)  03/12/21 234 lb (106.1 kg)  11/11/20 108 lb (49 kg)    Health Maintenance Due  Topic Date Due  . Hepatitis C Screening  Never done  . COVID-19 Vaccine (1) Never done  . HIV Screening  Never done  . COLONOSCOPY (Pts 45-38yrs Insurance coverage will need to be confirmed)  Never done  . INFLUENZA VACCINE  Never done    There are no preventive care reminders to display for this patient.  CBC Latest Ref Rng & Units 11/05/2019 09/22/2019 08/03/2019  WBC 4.0 -  10.5 K/uL 9.0 13.3(H) 10.1  Hemoglobin 12.0 - 15.0 g/dL 13.1 14.0 14.0  Hematocrit 36.0 - 46.0 % 39.2 41.2 41.0  Platelets 150 - 400 K/uL 296 288 290   CMP Latest Ref Rng & Units 11/05/2019 09/22/2019 08/03/2019  Glucose 70 - 99 mg/dL 103(H) 96 101(H)  BUN 6 - 20 mg/dL 17 14 22(H)  Creatinine 0.44 - 1.00 mg/dL 0.90 1.04(H) 1.08(H)  Sodium 135 - 145 mmol/L 139 140 142  Potassium 3.5 - 5.1 mmol/L 4.4 4.1 4.2  Chloride 98 - 111 mmol/L 107 105 109  CO2 22 - 32 mmol/L 25 24 25   Calcium 8.9 - 10.3 mg/dL 9.0 9.1 8.6(L)  Total Protein 6.5 - 8.1 g/dL 7.0 7.0 6.7  Total Bilirubin 0.3 - 1.2 mg/dL 0.5 0.5 0.4  Alkaline Phos 38 - 126 U/L 72 79 76  AST 15 - 41 U/L 17 18 18   ALT 0 - 44 U/L 16 13 18     No results found for: TSH Lab Results  Component Value Date   ALBUMIN 4.2 11/05/2019   ANIONGAP 7 11/05/2019   No results found for: CHOL, HDL, LDLCALC, CHOLHDL No results found for: TRIG No results found for: HGBA1C    ASSESSMENT & PLAN:   Problem List Items Addressed This Visit      Other   Acute pain of right knee - Primary    Patient returns with R knee pain, her uric acid level came back wnl so we are continuing with the plan to inject the joint today and get a plain film xray.       Relevant Orders   Joint Injection/Arthrocentesis   DG Knee Complete 4 Views Right (Completed)      No orders of the defined types were placed in this encounter.   Joint Injection/Arthrocentesis  Date/Time: 03/14/2021 9:16 AM Performed by: Beckie Salts, FNP Authorized by: Beckie Salts, FNP  Indications: pain  Body area: knee Joint: right knee Local anesthesia used: no  Anesthesia: Local anesthesia used: no  Sedation: Patient sedated: no  Needle size: 22 G Ultrasound guidance: no Approach: lateral Triamcinolone amount: 40 mg Lidocaine 1% amount: 2 mL Patient tolerance: patient tolerated the procedure well with no immediate complications      Follow-up: No follow-ups on file.     Beckie Salts, [place] [address] [place], [place] [address]

## 2021-03-14 NOTE — Assessment & Plan Note (Signed)
Patient returns with R knee pain, her uric acid level came back wnl so we are continuing with the plan to inject the joint today and get a plain film xray.

## 2021-03-17 ENCOUNTER — Other Ambulatory Visit: Payer: Self-pay

## 2021-03-17 ENCOUNTER — Ambulatory Visit
Admission: RE | Admit: 2021-03-17 | Discharge: 2021-03-17 | Disposition: A | Discharge status: Home / Self Care | Payer: Medicare Other | Source: Ambulatory Visit | Attending: Family Medicine | Admitting: Family Medicine

## 2021-03-17 ENCOUNTER — Ambulatory Visit
Admission: RE | Admit: 2021-03-17 | Discharge: 2021-03-17 | Disposition: A | Discharge status: Home / Self Care | Payer: Medicare Other | Attending: Family Medicine | Admitting: Family Medicine

## 2021-03-17 DIAGNOSIS — M25561 Pain in right knee: Secondary | ICD-10-CM

## 2021-03-17 IMAGING — CR DG KNEE COMPLETE 4+V*R*
1 series · 4 of 4 positions shown · non-contrast
Comparison: Right femur radiograph from [DATE]

CLINICAL DATA: 55-year-old female with right knee pain.

EXAM:
RIGHT KNEE - COMPLETE 4+ VIEW

[Series 1: dg knee complete 4 views right · 0.14mm/px · 4 of 4 slices shown]
[im 1/4]
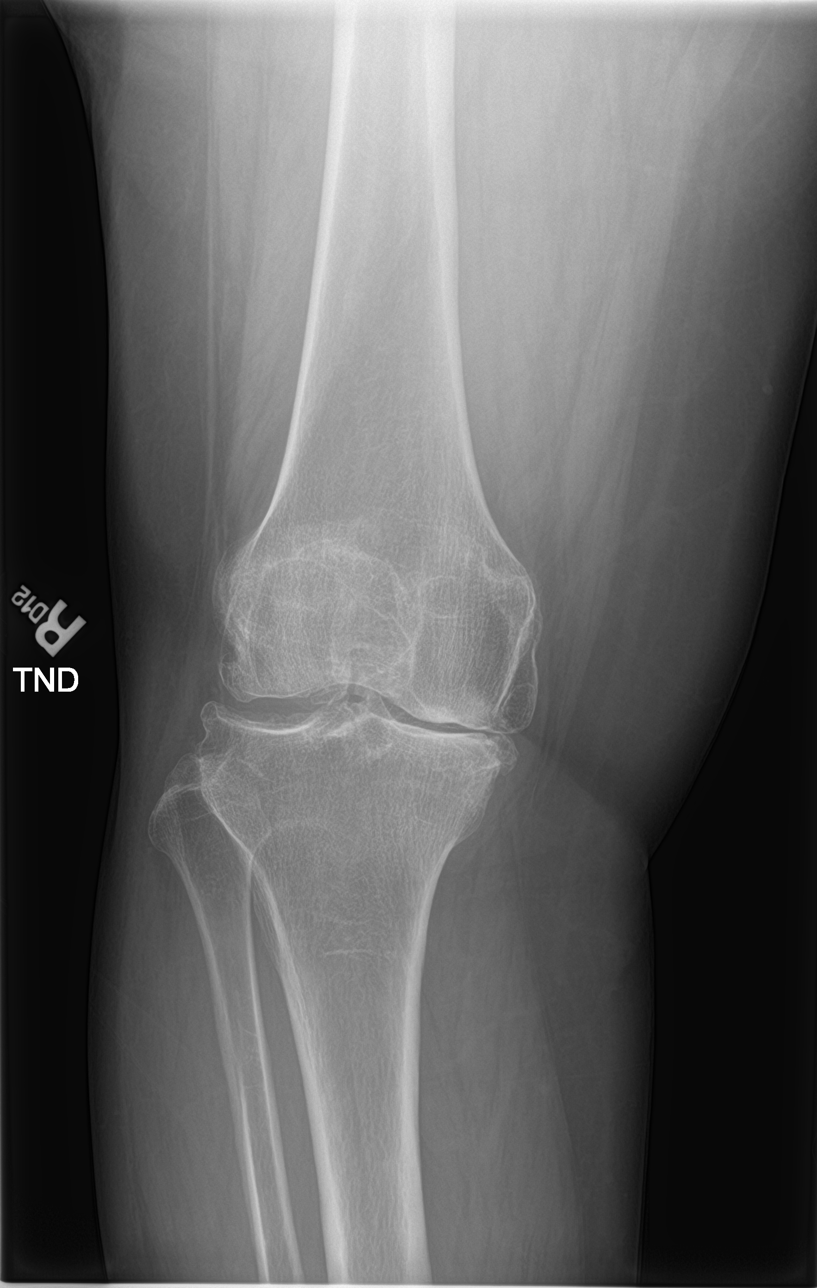
[im 2/4]
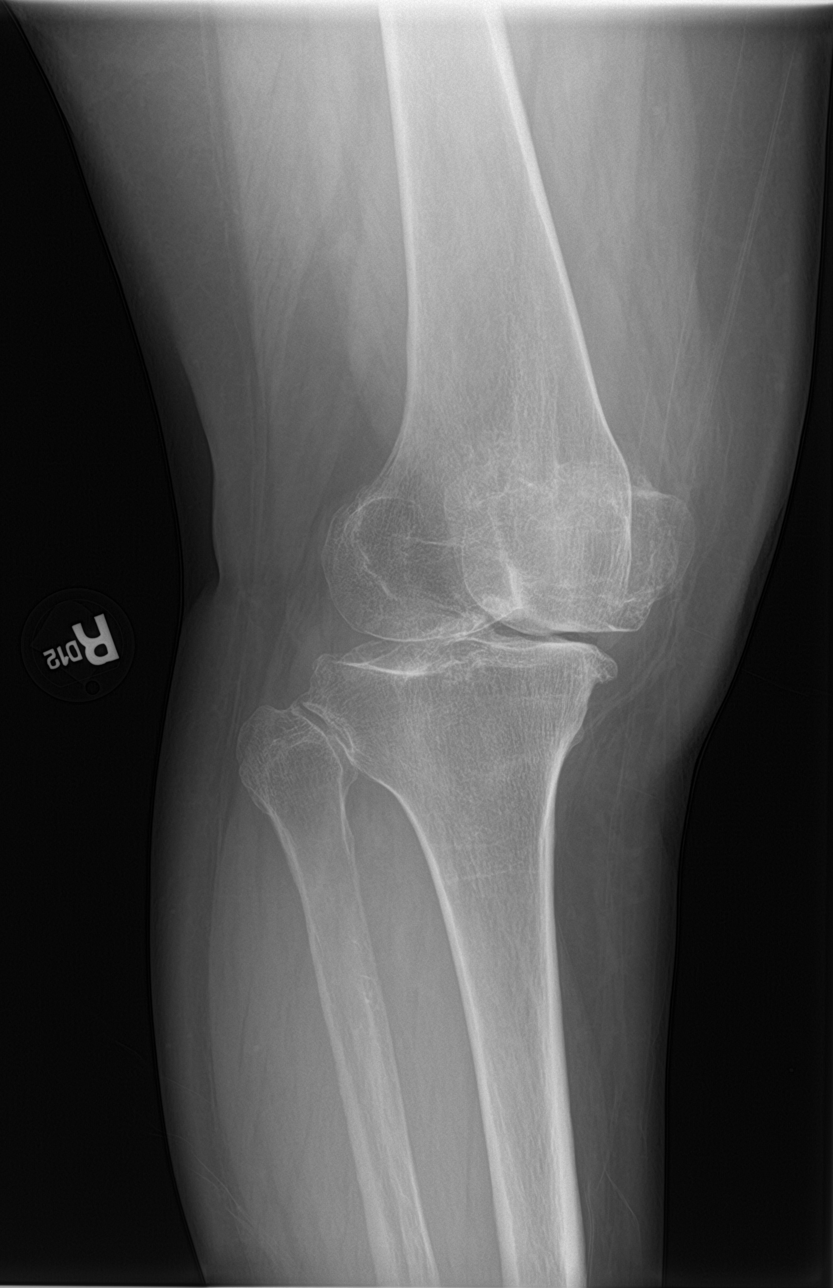
[im 3/4]
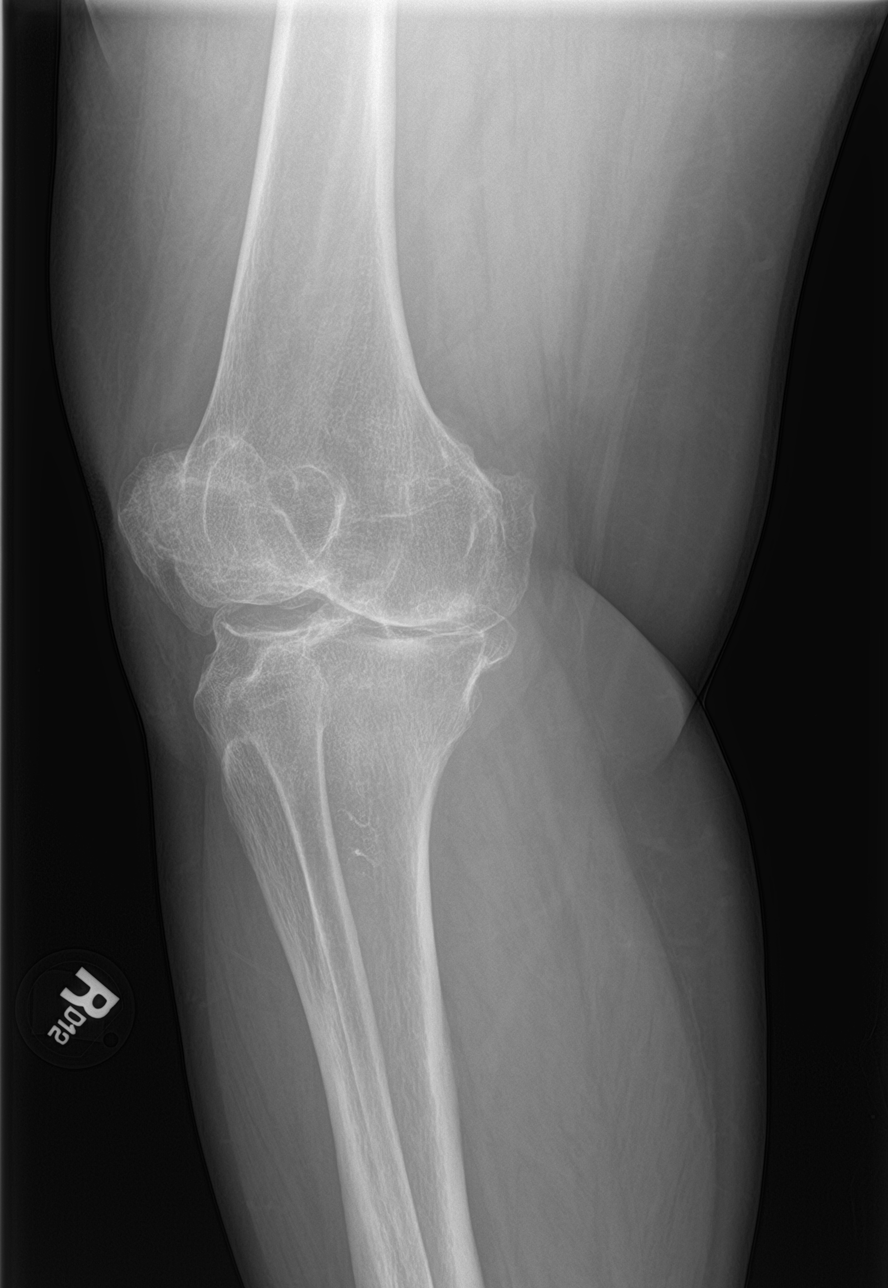
[im 4/4]
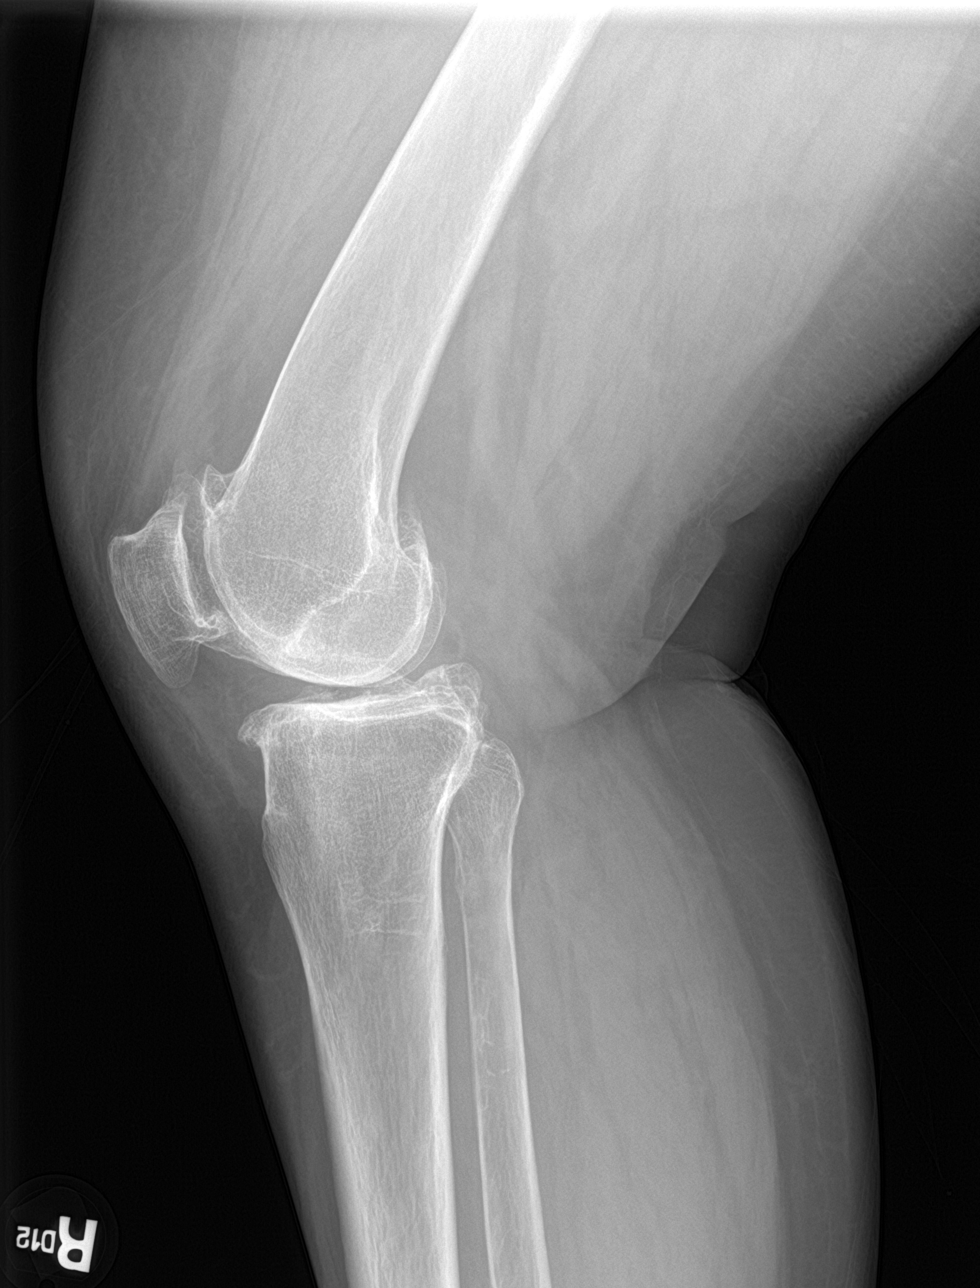

[4 of 4 positions shown; findings below may reference images not displayed]

FINDINGS: No evidence of fracture, dislocation, or joint effusion. Severe
medial compartment joint space narrowing. There is associated
subchondral sclerosis. Periarticular osteophytes are visualized
throughout the knee joint. Soft tissues are unremarkable.
IMPRESSION: Moderate tricompartmental degenerative changes, most pronounced
about the medial compartment, similar to [MX] comparison.

## 2021-03-20 ENCOUNTER — Other Ambulatory Visit: Payer: Self-pay | Admitting: *Deleted

## 2021-03-20 DIAGNOSIS — M25561 Pain in right knee: Secondary | ICD-10-CM

## 2021-04-07 ENCOUNTER — Other Ambulatory Visit: Payer: Self-pay | Admitting: Internal Medicine

## 2021-04-10 ENCOUNTER — Other Ambulatory Visit: Payer: Self-pay | Admitting: Internal Medicine

## 2021-04-10 ENCOUNTER — Ambulatory Visit
Admission: RE | Admit: 2021-04-10 | Discharge: 2021-04-10 | Disposition: A | Discharge status: Home / Self Care | Payer: Medicare Other | Source: Ambulatory Visit | Attending: Internal Medicine | Admitting: Internal Medicine

## 2021-04-10 ENCOUNTER — Other Ambulatory Visit: Payer: Self-pay

## 2021-04-10 DIAGNOSIS — N6320 Unspecified lump in the left breast, unspecified quadrant: Secondary | ICD-10-CM

## 2021-04-10 DIAGNOSIS — Z1231 Encounter for screening mammogram for malignant neoplasm of breast: Secondary | ICD-10-CM | POA: Insufficient documentation

## 2021-04-10 DIAGNOSIS — R928 Other abnormal and inconclusive findings on diagnostic imaging of breast: Secondary | ICD-10-CM

## 2021-04-14 ENCOUNTER — Other Ambulatory Visit: Payer: Self-pay | Admitting: Internal Medicine

## 2021-04-21 ENCOUNTER — Ambulatory Visit
Admission: RE | Admit: 2021-04-21 | Discharge: 2021-04-21 | Disposition: A | Discharge status: Home / Self Care | Payer: Medicare Other | Source: Ambulatory Visit | Attending: Internal Medicine | Admitting: Internal Medicine

## 2021-04-21 ENCOUNTER — Other Ambulatory Visit: Payer: Self-pay

## 2021-04-21 DIAGNOSIS — N6320 Unspecified lump in the left breast, unspecified quadrant: Secondary | ICD-10-CM | POA: Diagnosis not present

## 2021-04-21 DIAGNOSIS — R928 Other abnormal and inconclusive findings on diagnostic imaging of breast: Secondary | ICD-10-CM | POA: Diagnosis not present

## 2021-04-21 DIAGNOSIS — N6489 Other specified disorders of breast: Secondary | ICD-10-CM | POA: Diagnosis not present

## 2021-04-21 IMAGING — MG DIGITAL DIAGNOSTIC BILAT W/ TOMO W/ CAD
6 of 10 series · 6 of 30 positions shown · non-contrast
Comparison: Previous exam(s).

CLINICAL DATA: Patient presents with a palpable lump in the upper
outer left breast. She had a fall a few months ago injuring this
portion of the breast.

EXAM:
DIGITAL DIAGNOSTIC BILATERAL MAMMOGRAM WITH TOMOSYNTHESIS AND CAD;
ULTRASOUND LEFT BREAST LIMITED
TECHNIQUE: Bilateral digital diagnostic mammography and breast tomosynthesis
was performed. The images were evaluated with computer-aided
detection.; Targeted ultrasound examination of the left breast was
performed

[R MLO synth-2D]
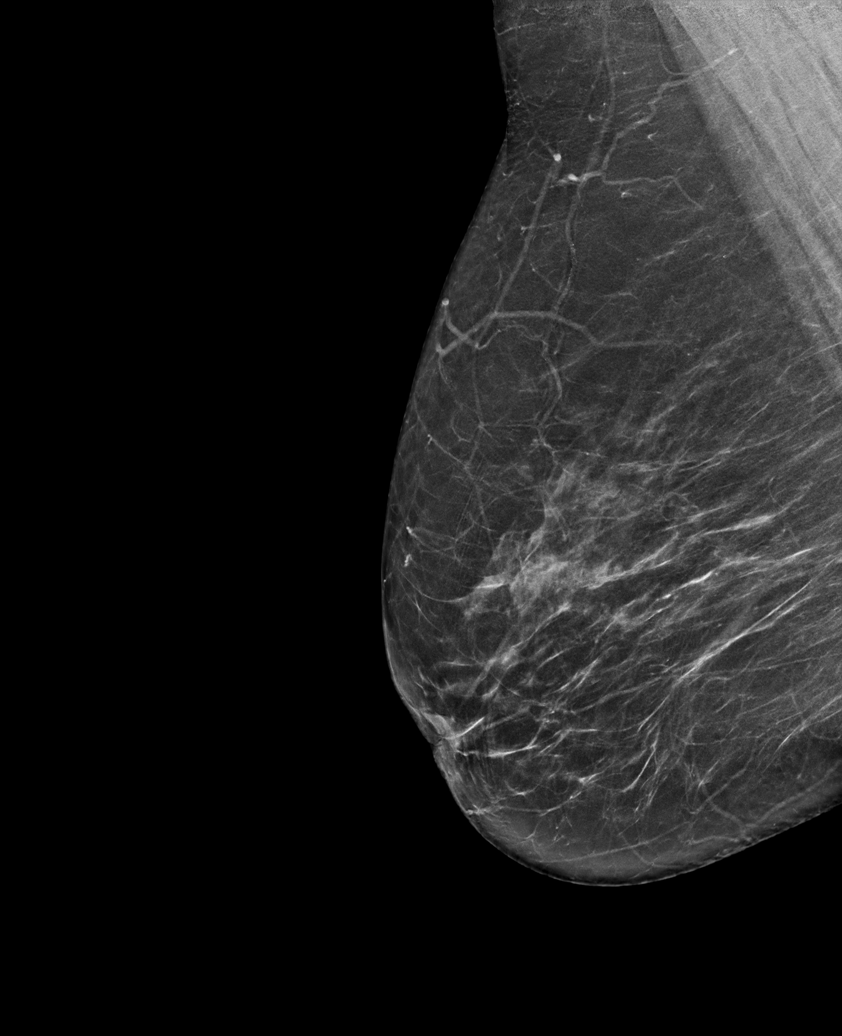

[L TAN synth-2D]
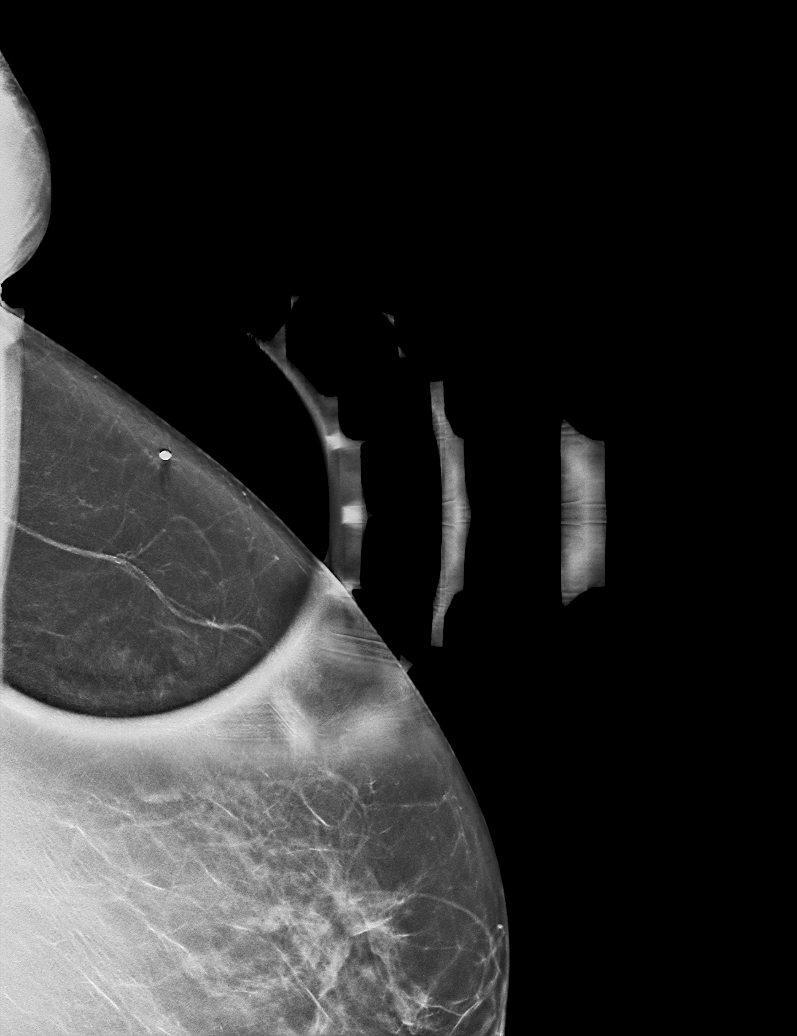

[L CC synth-2D]
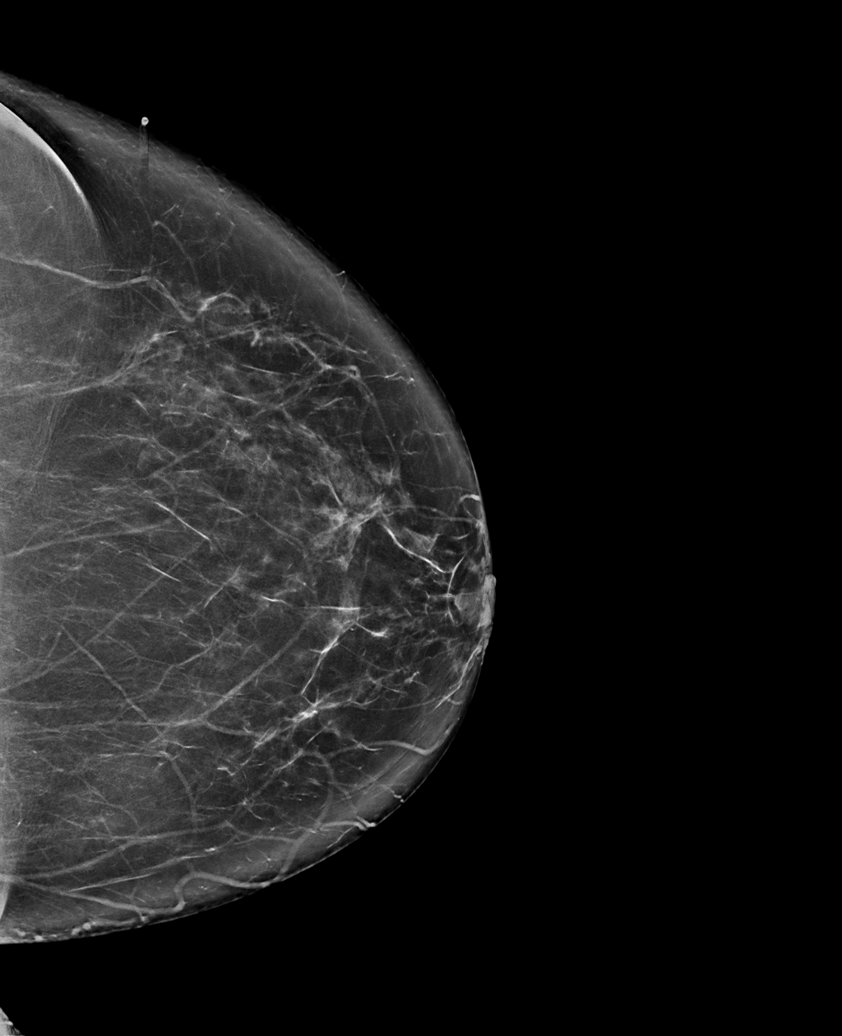

[R CC synth-2D]
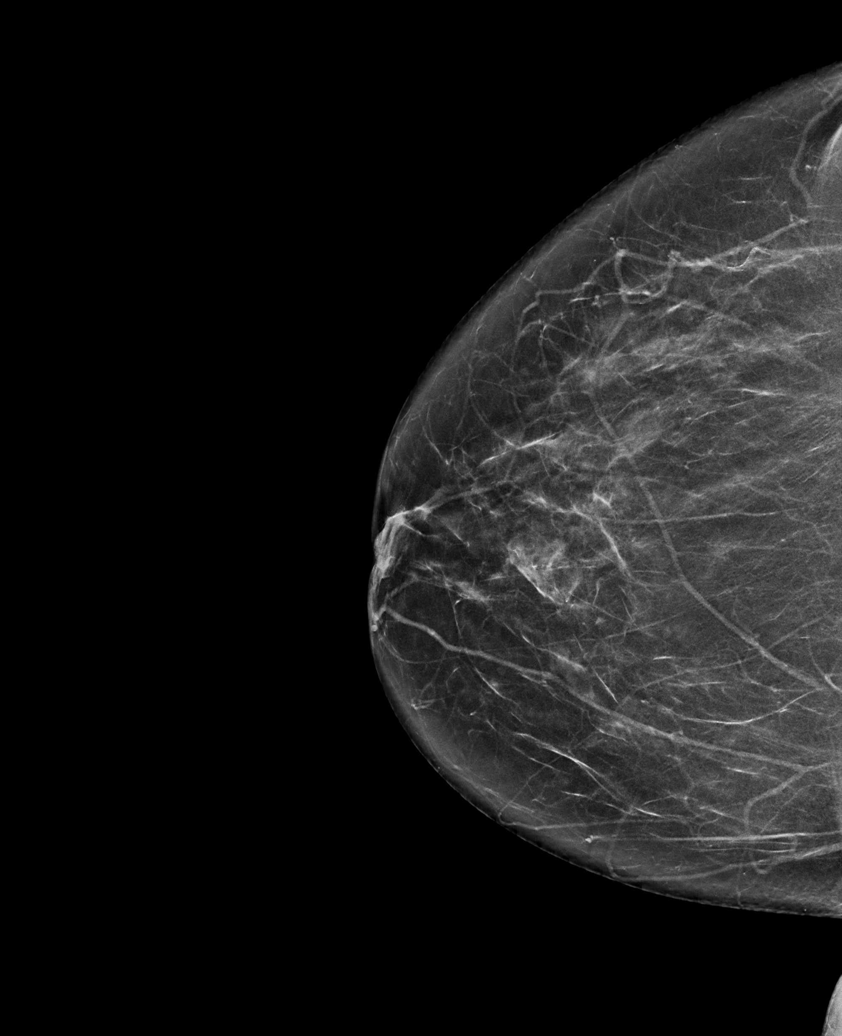

[L MLO synth-2D]
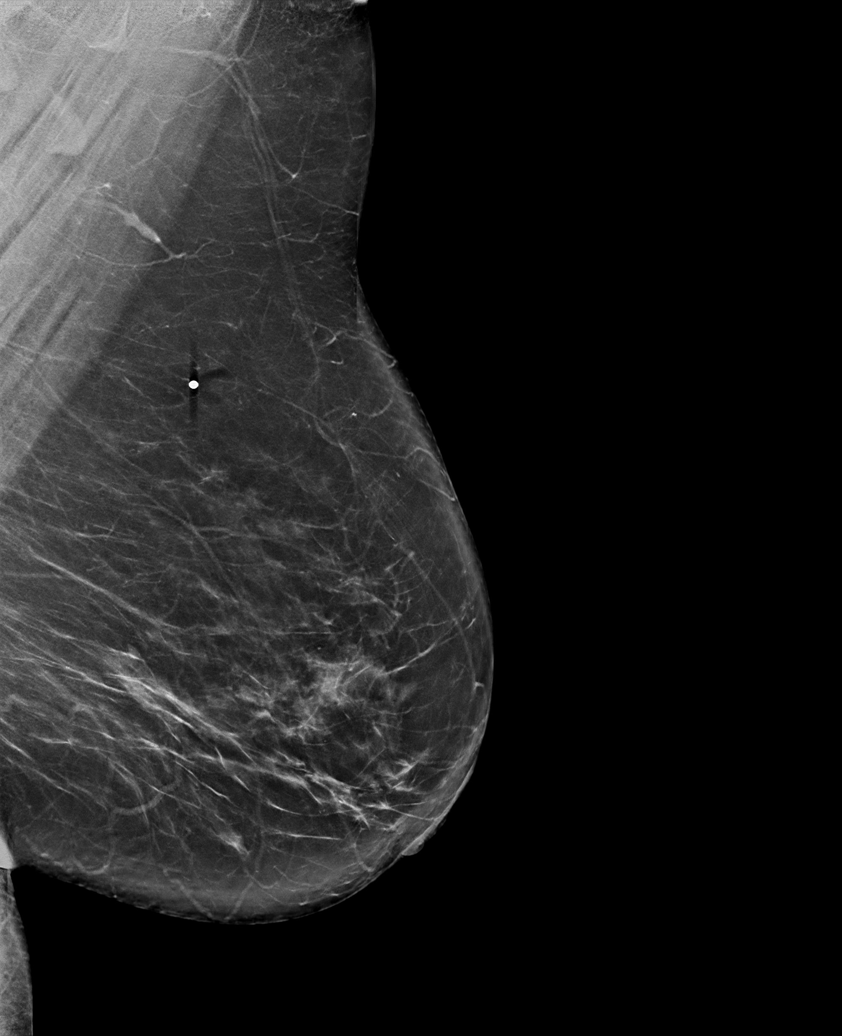

[L MLO tomo · tomo slice 43/84.0]
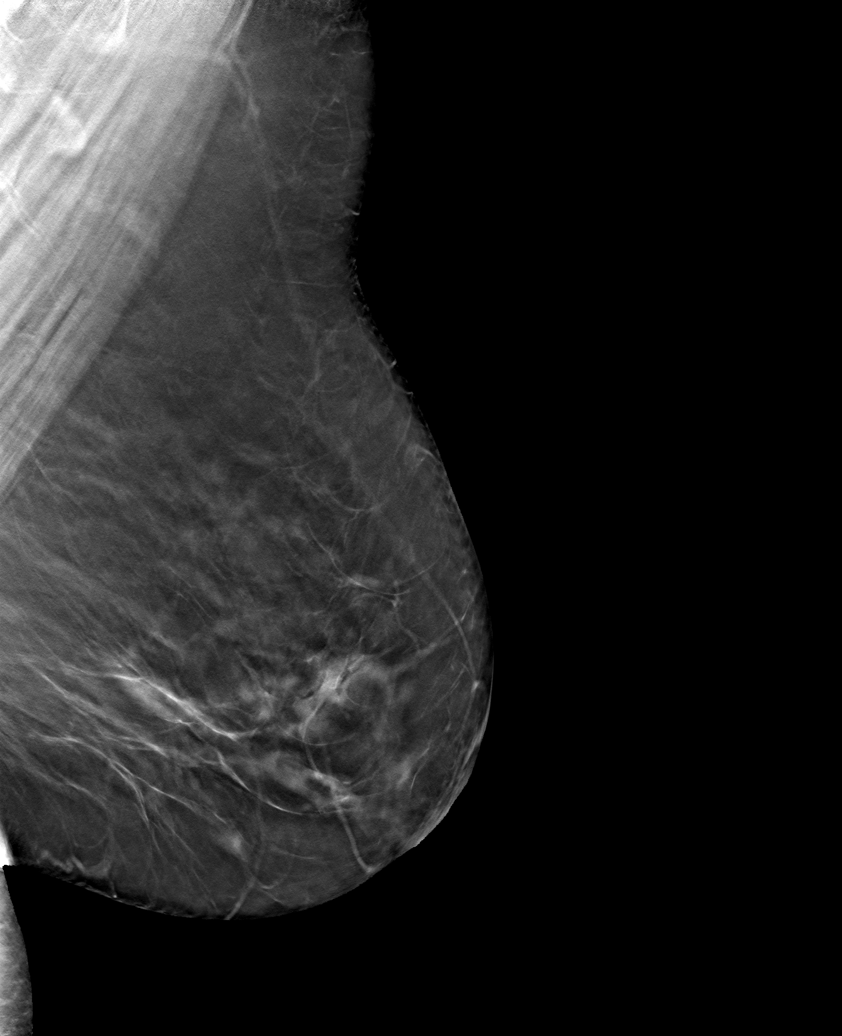

[6 of 30 positions shown; findings below may reference images not displayed]

ACR Breast Density Category b: There are scattered areas of
fibroglandular density.
FINDINGS: There are no masses, areas of architectural distortion, areas of
significant asymmetry or suspicious calcifications. No mammographic
change.

On physical exam, no mass is palpated in the upper outer left
breast.

Targeted ultrasound is performed, showing normal tissue throughout
the upper outer left breast targeting the area concern. No mass,
cyst or suspicious lesion.
IMPRESSION: Negative exam.  No evidence of breast malignancy.

RECOMMENDATION:
Screening mammogram in one year.(Code:[PA])

I have discussed the findings and recommendations with the patient.
If applicable, a reminder letter will be sent to the patient
regarding the next appointment.

BI-RADS CATEGORY  1: Negative.

## 2021-04-21 IMAGING — US US BREAST*L* LIMITED INC AXILLA
1 series · 2 of 2 positions shown · non-contrast
Comparison: Previous exam(s).

CLINICAL DATA: Patient presents with a palpable lump in the upper
outer left breast. She had a fall a few months ago injuring this
portion of the breast.

EXAM:
DIGITAL DIAGNOSTIC BILATERAL MAMMOGRAM WITH TOMOSYNTHESIS AND CAD;
ULTRASOUND LEFT BREAST LIMITED
TECHNIQUE: Bilateral digital diagnostic mammography and breast tomosynthesis
was performed. The images were evaluated with computer-aided
detection.; Targeted ultrasound examination of the left breast was
performed

[Series 1: us breast*left* limited inc axilla · 0.07mm/px · 2 of 2 slices shown]
[im 1/2]
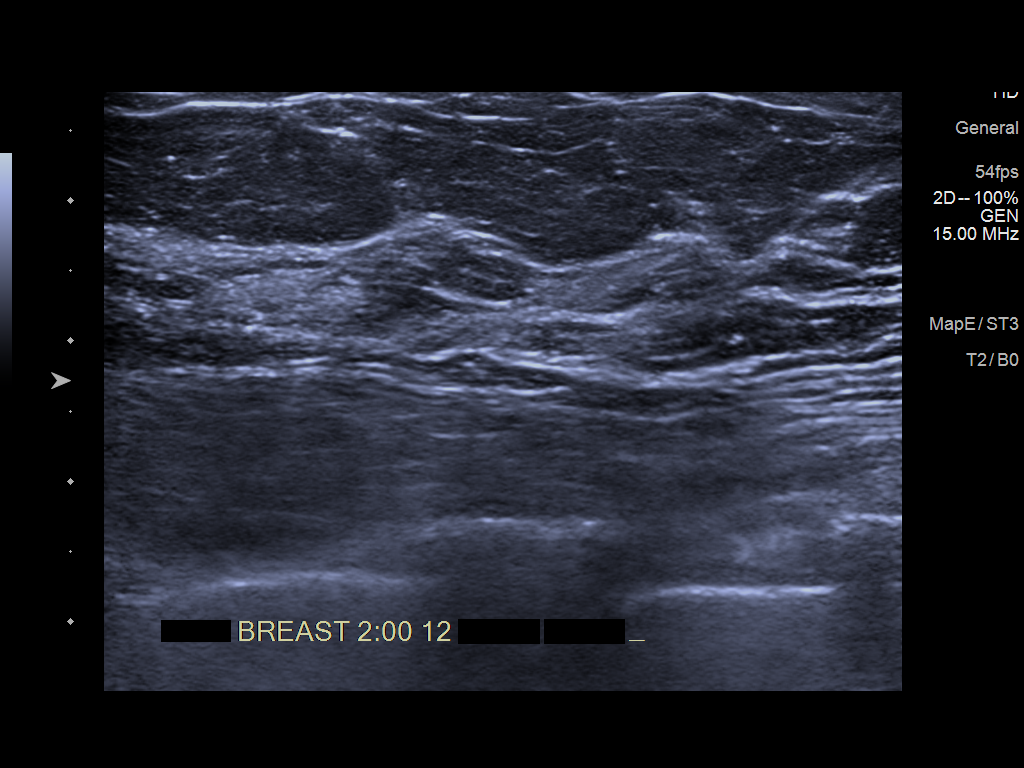
[im 2/2]
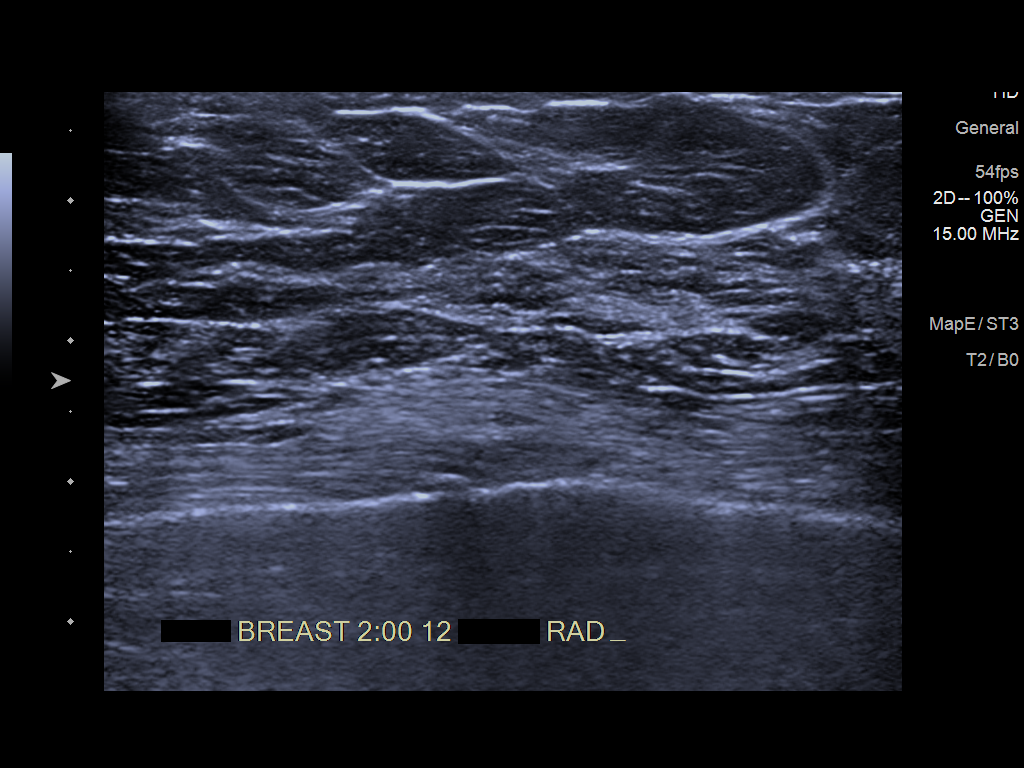

[2 of 2 positions shown; findings below may reference images not displayed]

ACR Breast Density Category b: There are scattered areas of
fibroglandular density.
FINDINGS: There are no masses, areas of architectural distortion, areas of
significant asymmetry or suspicious calcifications. No mammographic
change.

On physical exam, no mass is palpated in the upper outer left
breast.

Targeted ultrasound is performed, showing normal tissue throughout
the upper outer left breast targeting the area concern. No mass,
cyst or suspicious lesion.
IMPRESSION: Negative exam.  No evidence of breast malignancy.

RECOMMENDATION:
Screening mammogram in one year.(Code:[PA])

I have discussed the findings and recommendations with the patient.
If applicable, a reminder letter will be sent to the patient
regarding the next appointment.

BI-RADS CATEGORY  1: Negative.

## 2021-05-29 ENCOUNTER — Other Ambulatory Visit: Payer: Self-pay

## 2021-05-29 NOTE — Progress Notes (Signed)
Patient is going to [facility]

## 2021-06-05 ENCOUNTER — Other Ambulatory Visit: Payer: Self-pay | Admitting: Internal Medicine

## 2021-11-27 ENCOUNTER — Ambulatory Visit (INDEPENDENT_AMBULATORY_CARE_PROVIDER_SITE_OTHER): Payer: Medicare Other | Admitting: *Deleted

## 2021-11-27 DIAGNOSIS — Z0000 Encounter for general adult medical examination without abnormal findings: Secondary | ICD-10-CM | POA: Diagnosis not present

## 2021-11-27 MED ORDER — OMEPRAZOLE 20 MG PO CPDR
20.0000 mg | DELAYED_RELEASE_CAPSULE | Freq: Every day | ORAL | 3 refills | Status: DC
Start: 2021-11-27 — End: 2022-11-25

## 2021-11-27 NOTE — Progress Notes (Signed)
Subjective:   Kristen Hudson is a 55 y.o. female who presents for an Initial Medicare Annual Wellness Visit.   I discussed the limitations of evaluation and management by telemedicine and the availability of in person appointments. Patient expressed understanding and agreed to proceed.   Visit performed using audio  Patient:home Provider:home  Review of Systems    Defer to provider  Cardiac Risk Factors include: hypertension     Objective:    There were no vitals filed for this visit. There is no height or weight on file to calculate BMI.  Advanced Directives 11/27/2021 04/30/2020 03/26/2020 03/19/2020 11/05/2019 09/22/2019 08/03/2019  Does Patient Have a Medical Advance Directive? No No No No No No No  Type of Advance Directive - - - - - - -  Would patient like information on creating a medical advance directive? No - Patient declined No - Patient declined No - Patient declined - No - Patient declined - -    Current Medications (verified) Outpatient Encounter Medications as of 11/27/2021  Medication Sig   diazepam (VALIUM) 5 MG tablet Take 1 tablet (5 mg total) by mouth every 6 (six) hours as needed (vaginal valium). (Patient taking differently: Take 5 mg by mouth every 6 (six) hours as needed.)   gabapentin (NEURONTIN) 300 MG capsule TAKE 1 CAPSULE BY MOUTH  TWICE DAILY   levETIRAcetam (KEPPRA) 500 MG tablet TAKE 1 TABLET BY MOUTH  TWICE DAILY   meloxicam (MOBIC) 15 MG tablet TAKE 1 TABLET BY MOUTH  DAILY   methocarbamol (ROBAXIN) 500 MG tablet Take 1 tablet (500 mg total) by mouth 4 (four) times daily.   omeprazole (PRILOSEC) 20 MG capsule Take 1 capsule (20 mg total) by mouth daily at 2 PM.   phenytoin (DILANTIN) 125 MG/5ML suspension Take 125 mg by mouth 2 (two) times daily.    traZODone (DESYREL) 50 MG tablet TAKE 1 TABLET BY MOUTH  DAILY   [DISCONTINUED] omeprazole (PRILOSEC) 20 MG capsule Take 20 mg by mouth daily at 2 PM.   No facility-administered encounter medications on file  as of 11/27/2021.    Allergies (verified) Penicillins   History: Past Medical History:  Diagnosis Date   Acid reflux    Seizures (HCC)    Past Surgical History:  Procedure Laterality Date   APPENDECTOMY     CESAREAN SECTION     CHOLECYSTECTOMY     COLON RESECTION     OVARY SURGERY     Family History  Problem Relation Age of Onset   Breast cancer Mother 27   Colon cancer Father    Diabetes Father    Breast cancer Sister 76   Ovarian cancer Neg Hx    Social History   Socioeconomic History   Marital status: Single    Spouse name: Not on file   Number of children: Not on file   Years of education: Not on file   Highest education level: Not on file  Occupational History   Not on file  Tobacco Use   Smoking status: Every Day    Packs/day: 0.25    Types: Cigarettes   Smokeless tobacco: Never  Vaping Use   Vaping Use: Never used  Substance and Sexual Activity   Alcohol use: Not Currently   Drug use: Not Currently   Sexual activity: Not Currently  Other Topics Concern   Not on file  Social History Narrative   Not on file   Social Determinants of Health   Financial Resource Strain: Not  on file  Food Insecurity: Not on file  Transportation Needs: No Transportation Needs   Lack of Transportation (Medical): No   Lack of Transportation (Non-Medical): No  Physical Activity: Inactive   Days of Exercise per Week: 0 days   Minutes of Exercise per Session: 0 min  Stress: Not on file  Social Connections: Socially Isolated   Frequency of Communication with Friends and Family: More than three times a week   Frequency of Social Gatherings with Friends and Family: More than three times a week   Attends Religious Services: Never   Marine scientist or Organizations: No   Attends Music therapist: Never   Marital Status: Divorced    Tobacco Counseling Ready to quit: Not Answered Counseling given: Not Answered   Clinical Intake:  Pre-visit  preparation completed: Yes  Pain : No/denies pain     Nutritional Risks: None Diabetes: No  How often do you need to have someone help you when you read instructions, pamphlets, or other written materials from your doctor or pharmacy?: 1 - Never What is the last grade level you completed in school?: 12  Diabetic?no  Interpreter Needed?: No  Information entered by :: Lamanda Rudder,cma   Activities of Daily Living In your present state of health, do you have any difficulty performing the following activities: 11/27/2021  Hearing? N  Vision? N  Difficulty concentrating or making decisions? N  Walking or climbing stairs? Y  Dressing or bathing? N  Doing errands, shopping? N  Preparing Food and eating ? N  Using the Toilet? N  In the past six months, have you accidently leaked urine? N  Do you have problems with loss of bowel control? N  Managing your Medications? N  Managing your Finances? N  Housekeeping or managing your Housekeeping? N  Some recent data might be hidden    Patient Care Team: Beckie Salts, FNP as PCP - General (Family Medicine)  Indicate any recent Medical Services you may have received from other than [facility] providers in the past year (date may be approximate).     Assessment:   This is a routine wellness examination for Kristen Hudson.  Hearing/Vision screen No results found.  Dietary issues and exercise activities discussed: Exercise limited by: None identified   Goals Addressed   None   Depression Screen PHQ 2/9 Scores 11/27/2021  PHQ - 2 Score 0    Fall Risk Fall Risk  11/27/2021  Falls in the past year? 0  Number falls in past yr: 0  Injury with Fall? 0  Risk for fall due to : No Fall Risks  Follow up Falls evaluation completed    [place]:  Any stairs in or around the home? No  If so, are there any without handrails? No  Home free of loose throw rugs in walkways, pet beds, electrical cords, etc? Yes   Adequate lighting in your home to reduce risk of falls? Yes   ASSISTIVE DEVICES UTILIZED TO PREVENT FALLS:  Life alert? No  Use of a cane, walker or w/c? No  Grab bars in the bathroom? No  Shower chair or bench in shower? No  Elevated toilet seat or a handicapped toilet? No   TIMED UP AND GO:  Was the test performed? No .    Gait slow and steady without use of assistive device  Cognitive Function: MMSE - Mini Mental State Exam 11/27/2021  Not completed: Unable to complete  6CIT Screen 11/27/2021  What Year? 0 points  What month? 0 points  What time? 0 points  Count back from 20 0 points  Months in reverse 0 points  Repeat phrase 0 points  Total Score 0    Immunizations Immunization History  Administered Date(s) Administered   Tdap 04/30/2020    TDAP status: Up to date  Flu Vaccine status: Declined, Education has been provided regarding the importance of this vaccine but patient still declined. Advised may receive this vaccine at local pharmacy or Health Dept. Aware to provide a copy of the vaccination record if obtained from local pharmacy or Health Dept. Verbalized acceptance and understanding.  Pneumococcal vaccine status: Declined,  Education has been provided regarding the importance of this vaccine but patient still declined. Advised may receive this vaccine at local pharmacy or Health Dept. Aware to provide a copy of the vaccination record if obtained from local pharmacy or Health Dept. Verbalized acceptance and understanding.   Covid-19 vaccine status: Declined, Education has been provided regarding the importance of this vaccine but patient still declined. Advised may receive this vaccine at local pharmacy or Health Dept.or vaccine clinic. Aware to provide a copy of the vaccination record if obtained from local pharmacy or Health Dept. Verbalized acceptance and understanding.  Qualifies for Shingles Vaccine? Yes   Zostavax completed No   Shingrix Completed?:  No.    Education has been provided regarding the importance of this vaccine. Patient has been advised to call insurance company to determine out of pocket expense if they have not yet received this vaccine. Advised may also receive vaccine at local pharmacy or Health Dept. Verbalized acceptance and understanding.  Screening Tests Health Maintenance  Topic Date Due   HIV Screening  Never done   Hepatitis C Screening  Never done   PAP SMEAR-Modifier  11/27/2021 (Originally 08/24/2021)   Zoster Vaccines- Shingrix (1 of 2) 02/25/2022 (Originally 01/24/2016)   INFLUENZA VACCINE  03/27/2022 (Originally 07/28/2021)   Pneumococcal Vaccine 64-63 Years old (1 - PCV) 11/27/2022 (Originally 01/24/1972)   COLONOSCOPY (Pts 45-14yrs Insurance coverage will need to be confirmed)  11/27/2022 (Originally 01/23/2011)   COVID-19 Vaccine (1) 12/13/2022 (Originally 07/23/1966)   MAMMOGRAM  04/22/2023   TETANUS/TDAP  04/30/2030   HPV VACCINES  Aged Out    Health Maintenance  Health Maintenance Due  Topic Date Due   HIV Screening  Never done   Hepatitis C Screening  Never done   Patient declines colonoscopy at this time   Mammogram status: Completed 04/21/21. Repeat every year    Lung Cancer Screening: (Low Dose CT Chest recommended if Age 77-80 years, 30 pack-year currently smoking OR have quit w/in 15years.) does not qualify.   Lung Cancer Screening Referral: na  Additional Screening:  Hepatitis C Screening: does qualify, will complete at next lab draw   Vision Screening: Recommended annual ophthalmology exams for early detection of glaucoma and other disorders of the eye. Is the patient up to date with their annual eye exam?  No  Who is the provider or what is the name of the office in which the patient attends annual eye exams?  If pt is not established with a provider, would they like to be referred to a provider to establish care? No .   Dental Screening: Recommended annual dental exams for proper  oral hygiene  Community Resource Referral / Chronic Care Management: CRR required this visit?  No   CCM required this visit?  No  Plan:     I have personally reviewed and noted the following in the patient's chart:   Medical and social history Use of alcohol, tobacco or illicit drugs  Current medications and supplements including opioid prescriptions. Patient is not currently taking opioid prescriptions. Functional ability and status Nutritional status Physical activity Advanced directives List of other physicians Hospitalizations, surgeries, and ER visits in previous 12 months Vitals Screenings to include cognitive, depression, and falls Referrals and appointments  In addition, I have reviewed and discussed with patient certain preventive protocols, quality metrics, and best practice recommendations. A written personalized care plan for preventive services as well as general preventive health recommendations were provided to patient.     Lacretia Nicks, [place]   11/27/2021   Nurse Notes:  Ms. Marsiglia , Thank you for taking time to come for your Medicare Wellness Visit. I appreciate your ongoing commitment to your health goals. Please review the following plan we discussed and let me know if I can assist you in the future.   These are the goals we discussed:  Goals   None     This is a list of the screening recommended for you and due dates:  Health Maintenance  Topic Date Due   HIV Screening  Never done   Hepatitis C Screening: USPSTF Recommendation to screen - Ages 78-79 yo.  Never done   Pap Smear  11/27/2021*   Zoster (Shingles) Vaccine (1 of 2) 02/25/2022*   Flu Shot  03/27/2022*   Pneumococcal Vaccination (1 - PCV) 11/27/2022*   Colon Cancer Screening  11/27/2022*   COVID-19 Vaccine (1) 12/13/2022*   Mammogram  04/22/2023   Tetanus Vaccine  04/30/2030   HPV Vaccine  Aged Out  *Topic was postponed. The date shown is not the original due date.         Time spent 40 min

## 2021-11-28 NOTE — Progress Notes (Signed)
I have reviewed this visit and agree with the documentation.   

## 2021-12-04 ENCOUNTER — Other Ambulatory Visit: Payer: Self-pay | Admitting: *Deleted

## 2021-12-04 MED ORDER — DIAZEPAM 10 MG PO TABS: 10.0000 mg | ORAL_TABLET | Freq: Two times a day (BID) | ORAL | 1 refills | Status: DC | PRN

## 2021-12-05 ENCOUNTER — Other Ambulatory Visit: Payer: Self-pay

## 2021-12-05 ENCOUNTER — Ambulatory Visit (INDEPENDENT_AMBULATORY_CARE_PROVIDER_SITE_OTHER): Payer: Medicare Other | Admitting: *Deleted

## 2021-12-05 DIAGNOSIS — E559 Vitamin D deficiency, unspecified: Secondary | ICD-10-CM

## 2021-12-05 DIAGNOSIS — Z1159 Encounter for screening for other viral diseases: Secondary | ICD-10-CM

## 2021-12-05 DIAGNOSIS — Z114 Encounter for screening for human immunodeficiency virus [HIV]: Secondary | ICD-10-CM

## 2021-12-05 DIAGNOSIS — Z0000 Encounter for general adult medical examination without abnormal findings: Secondary | ICD-10-CM | POA: Diagnosis not present

## 2021-12-08 LAB — CBC WITH DIFFERENTIAL/PLATELET
Absolute Monocytes: 602 cells/uL (ref 200–950)
Basophils Absolute: 69 cells/uL (ref 0–200)
Basophils Relative: 0.8 %
Eosinophils Absolute: 155 cells/uL (ref 15–500)
Eosinophils Relative: 1.8 %
HCT: 43 % (ref 35.0–45.0)
Hemoglobin: 14.6 g/dL (ref 11.7–15.5)
Lymphs Abs: 1918 cells/uL (ref 850–3900)
MCH: 30.4 pg (ref 27.0–33.0)
MCHC: 34 g/dL (ref 32.0–36.0)
MCV: 89.4 fL (ref 80.0–100.0)
MPV: 10.9 fL (ref 7.5–12.5)
Monocytes Relative: 7 %
Neutro Abs: 5857 cells/uL (ref 1500–7800)
Neutrophils Relative %: 68.1 %
Platelets: 318 10*3/uL (ref 140–400)
RBC: 4.81 10*6/uL (ref 3.80–5.10)
RDW: 11.9 % (ref 11.0–15.0)
Total Lymphocyte: 22.3 %
WBC: 8.6 10*3/uL (ref 3.8–10.8)

## 2021-12-08 LAB — COMPLETE METABOLIC PANEL WITH GFR
AG Ratio: 1.5 (calc) (ref 1.0–2.5)
ALT: 10 U/L (ref 6–29)
AST: 12 U/L (ref 10–35)
Albumin: 4.3 g/dL (ref 3.6–5.1)
Alkaline phosphatase (APISO): 80 U/L (ref 37–153)
BUN: 17 mg/dL (ref 7–25)
CO2: 24 mmol/L (ref 20–32)
Calcium: 9.7 mg/dL (ref 8.6–10.4)
Chloride: 106 mmol/L (ref 98–110)
Creat: 1.02 mg/dL (ref 0.50–1.03)
Globulin: 2.8 g/dL (calc) (ref 1.9–3.7)
Glucose, Bld: 103 mg/dL — ABNORMAL HIGH (ref 65–99)
Potassium: 4.3 mmol/L (ref 3.5–5.3)
Sodium: 141 mmol/L (ref 135–146)
Total Bilirubin: 0.5 mg/dL (ref 0.2–1.2)
Total Protein: 7.1 g/dL (ref 6.1–8.1)
eGFR: 65 mL/min/{1.73_m2} (ref >=60)

## 2021-12-08 LAB — HIV ANTIBODY (ROUTINE TESTING W REFLEX): HIV 1&2 Ab, 4th Generation: NONREACTIVE

## 2021-12-08 LAB — HEPATITIS C ANTIBODY
Hepatitis C Ab: NONREACTIVE
SIGNAL TO CUT-OFF: <0.02 (ref <1.00)

## 2021-12-08 LAB — LIPID PANEL
Cholesterol: 187 mg/dL (ref <200)
HDL: 39 mg/dL — ABNORMAL LOW (ref >=50)
LDL Cholesterol (Calc): 126 mg/dL (calc) — ABNORMAL HIGH
Non-HDL Cholesterol (Calc): 148 mg/dL (calc) — ABNORMAL HIGH (ref <130)
Total CHOL/HDL Ratio: 4.8 (calc) (ref <5.0)
Triglycerides: 117 mg/dL (ref <150)

## 2021-12-08 LAB — TSH: TSH: 1.91 mIU/L

## 2021-12-08 LAB — VITAMIN D 25 HYDROXY (VIT D DEFICIENCY, FRACTURES): Vit D, 25-Hydroxy: 23 ng/mL — ABNORMAL LOW (ref 30–100)

## 2022-03-10 ENCOUNTER — Other Ambulatory Visit: Payer: Self-pay | Admitting: Internal Medicine

## 2022-03-18 ENCOUNTER — Other Ambulatory Visit: Payer: Self-pay | Admitting: Internal Medicine

## 2022-04-05 ENCOUNTER — Other Ambulatory Visit: Payer: Self-pay | Admitting: Internal Medicine

## 2022-04-15 ENCOUNTER — Other Ambulatory Visit: Payer: Self-pay | Admitting: Internal Medicine

## 2022-07-21 ENCOUNTER — Ambulatory Visit (INDEPENDENT_AMBULATORY_CARE_PROVIDER_SITE_OTHER): Payer: Medicare Other | Admitting: Internal Medicine

## 2022-07-21 ENCOUNTER — Encounter: Payer: Self-pay | Admitting: Internal Medicine

## 2022-07-21 VITALS — BP 109/81 | HR 94 | Temp 97.1°F | Ht 64.0 in | Wt 205.3 lb

## 2022-07-21 DIAGNOSIS — R1031 Right lower quadrant pain: Secondary | ICD-10-CM | POA: Diagnosis not present

## 2022-07-21 DIAGNOSIS — F17210 Nicotine dependence, cigarettes, uncomplicated: Secondary | ICD-10-CM | POA: Diagnosis not present

## 2022-07-21 DIAGNOSIS — Z79899 Other long term (current) drug therapy: Secondary | ICD-10-CM | POA: Diagnosis not present

## 2022-07-21 DIAGNOSIS — N12 Tubulo-interstitial nephritis, not specified as acute or chronic: Secondary | ICD-10-CM | POA: Diagnosis not present

## 2022-07-21 DIAGNOSIS — R1013 Epigastric pain: Secondary | ICD-10-CM | POA: Diagnosis not present

## 2022-07-21 DIAGNOSIS — N23 Unspecified renal colic: Secondary | ICD-10-CM | POA: Diagnosis not present

## 2022-07-21 DIAGNOSIS — R197 Diarrhea, unspecified: Secondary | ICD-10-CM | POA: Diagnosis not present

## 2022-07-21 DIAGNOSIS — Z880 Allergy status to penicillin: Secondary | ICD-10-CM | POA: Diagnosis not present

## 2022-07-21 DIAGNOSIS — Z6833 Body mass index (BMI) 33.0-33.9, adult: Secondary | ICD-10-CM | POA: Diagnosis not present

## 2022-07-21 DIAGNOSIS — R1110 Vomiting, unspecified: Secondary | ICD-10-CM | POA: Diagnosis not present

## 2022-07-21 DIAGNOSIS — R059 Cough, unspecified: Secondary | ICD-10-CM | POA: Diagnosis not present

## 2022-07-21 DIAGNOSIS — R569 Unspecified convulsions: Secondary | ICD-10-CM | POA: Diagnosis not present

## 2022-07-21 DIAGNOSIS — R109 Unspecified abdominal pain: Secondary | ICD-10-CM | POA: Diagnosis not present

## 2022-07-21 DIAGNOSIS — R110 Nausea: Secondary | ICD-10-CM | POA: Diagnosis not present

## 2022-07-21 DIAGNOSIS — R1011 Right upper quadrant pain: Secondary | ICD-10-CM | POA: Diagnosis not present

## 2022-07-21 DIAGNOSIS — R067 Sneezing: Secondary | ICD-10-CM | POA: Diagnosis not present

## 2022-07-21 NOTE — Progress Notes (Signed)
Established Patient Office Visit  Subjective:  Patient ID: Kristen Hudson, female    DOB: 03/27/1966  Age: 56 y.o. MRN: [id number]  CC:  Chief Complaint  Patient presents with   Cough    Patient reports sharp pain in her chest when coughing, deep breaths and when she moves. Vomiting and diarrhea with fever.    Cough    Allyne Barritt presents for rt flank  pain with  nausea    Past Medical History:  Diagnosis Date   Acid reflux    Seizures ([place])     Past Surgical History:  Procedure Laterality Date   APPENDECTOMY     CESAREAN SECTION     CHOLECYSTECTOMY     COLON RESECTION     OVARY SURGERY      Family History  Problem Relation Age of Onset   Breast cancer Mother 70   Colon cancer Father    Diabetes Father    Breast cancer Sister 104   Ovarian cancer Neg Hx     Social History   Socioeconomic History   Marital status: Single    Spouse name: Not on file   Number of children: Not on file   Years of education: Not on file   Highest education level: Not on file  Occupational History   Not on file  Tobacco Use   Smoking status: Every Day    Packs/day: 0.25    Types: Cigarettes   Smokeless tobacco: Never  Vaping Use   Vaping Use: Never used  Substance and Sexual Activity   Alcohol use: Not Currently   Drug use: Not Currently   Sexual activity: Not Currently  Other Topics Concern   Not on file  Social History Narrative   Not on file   Social Determinants of Health   Financial Resource Strain: Not on file  Food Insecurity: Not on file  Transportation Needs: No Transportation Needs (11/27/2021)   PRAPARE - Transportation    Lack of Transportation (Medical): No    Lack of Transportation (Non-Medical): No  Physical Activity: Inactive (11/27/2021)   Exercise Vital Sign    Days of Exercise per Week: 0 days    Minutes of Exercise per Session: 0 min  Stress: Not on file  Social Connections: Socially Isolated (11/27/2021)   Social Connection and Isolation  Panel [NHANES]    Frequency of Communication with Friends and Family: More than three times a week    Frequency of Social Gatherings with Friends and Family: More than three times a week    Attends Religious Services: Never    Marine scientist or Organizations: No    Attends Archivist Meetings: Never    Marital Status: Divorced  Human resources officer Violence: Not At Risk (11/27/2021)   Humiliation, Afraid, Rape, and Kick questionnaire    Fear of Current or Ex-Partner: No    Emotionally Abused: No    Physically Abused: No    Sexually Abused: No     Current Outpatient Medications:    diazepam (VALIUM) 10 MG tablet, Take 1 tablet (10 mg total) by mouth every 12 (twelve) hours as needed for anxiety., Disp: 30 tablet, Rfl: 1   gabapentin (NEURONTIN) 300 MG capsule, TAKE 1 CAPSULE BY MOUTH  TWICE DAILY, Disp: 180 capsule, Rfl: 3   levETIRAcetam (KEPPRA) 500 MG tablet, TAKE 1 TABLET BY MOUTH  TWICE DAILY, Disp: 180 tablet, Rfl: 3   meloxicam (MOBIC) 15 MG tablet, TAKE 1 TABLET BY MOUTH  DAILY, Disp:  100 tablet, Rfl: 2   methocarbamol (ROBAXIN) 500 MG tablet, Take 1 tablet (500 mg total) by mouth 4 (four) times daily., Disp: 16 tablet, Rfl: 0   omeprazole (PRILOSEC) 20 MG capsule, Take 1 capsule (20 mg total) by mouth daily at 2 PM., Disp: 90 capsule, Rfl: 3   phenytoin (DILANTIN) 125 MG/5ML suspension, Take 125 mg by mouth 2 (two) times daily. , Disp: , Rfl:    traZODone (DESYREL) 50 MG tablet, TAKE 1 TABLET BY MOUTH  DAILY, Disp: 90 tablet, Rfl: 3   Allergies  Allergen Reactions   Penicillins Anaphylaxis    Did it involve swelling of the face/tongue/throat, SOB, or low BP? No Did it involve sudden or severe rash/hives, skin peeling, or any reaction on the inside of your mouth or nose? No Did you need to seek medical attention at a hospital or doctor's office? No When did it last happen?     teenager  If all above answers are "NO", may proceed with cephalosporin use.      ROS Review of Systems  Respiratory:  Positive for cough.   Genitourinary:  Positive for flank pain and pelvic pain. Negative for hematuria.  Allergic/Immunologic: Negative.   Neurological: Negative.   Psychiatric/Behavioral: Negative.        Objective:    Physical Exam Abdominal:     Tenderness: There is abdominal tenderness.       Comments: Pain and tenderness  Musculoskeletal:        General: Normal range of motion.  Neurological:     General: No focal deficit present.  Psychiatric:        Mood and Affect: Mood normal.     BP 109/81   Pulse 94   Temp (!) 97.1 F (36.2 C)   Ht '5\' 4"'  (1.626 m)   Wt 205 lb 4.8 oz (93.1 kg)   SpO2 98%   BMI 35.24 kg/m  Wt Readings from Last 3 Encounters:  07/21/22 205 lb 4.8 oz (93.1 kg)  03/14/21 232 lb 6.4 oz (105.4 kg)  03/12/21 234 lb (106.1 kg)     Health Maintenance Due  Topic Date Due   Zoster Vaccines- Shingrix (1 of 2) Never done   PAP SMEAR-Modifier  08/24/2021    There are no preventive care reminders to display for this patient.  Lab Results  Component Value Date   TSH 1.91 12/05/2021   Lab Results  Component Value Date   WBC 8.6 12/05/2021   HGB 14.6 12/05/2021   HCT 43.0 12/05/2021   MCV 89.4 12/05/2021   PLT 318 12/05/2021   Lab Results  Component Value Date   NA 141 12/05/2021   K 4.3 12/05/2021   CO2 24 12/05/2021   GLUCOSE 103 (H) 12/05/2021   BUN 17 12/05/2021   CREATININE 1.02 12/05/2021   BILITOT 0.5 12/05/2021   ALKPHOS 72 11/05/2019   AST 12 12/05/2021   ALT 10 12/05/2021   PROT 7.1 12/05/2021   ALBUMIN 4.2 11/05/2019   CALCIUM 9.7 12/05/2021   ANIONGAP 7 11/05/2019   EGFR 65 12/05/2021   Lab Results  Component Value Date   CHOL 187 12/05/2021   Lab Results  Component Value Date   HDL 39 (L) 12/05/2021   Lab Results  Component Value Date   LDLCALC 126 (H) 12/05/2021   Lab Results  Component Value Date   TRIG 117 12/05/2021   Lab Results  Component Value Date    CHOLHDL 4.8 12/05/2021   No results  found for: "HGBA1C"    Assessment & Plan:   Problem List Items Addressed This Visit       Other   BMI 33.0-33.9,adult - Primary    - I encouraged the patient to lose weight.  - I educated them on making healthy dietary choices including eating more fruits and vegetables and less fried foods. - I encouraged the patient to exercise more, and educated on the benefits of exercise including weight loss, diabetes prevention, and hypertension prevention.   Dietary counseling with a registered dietician  Referral to a weight management support group (e.g. Weight Watchers, Overeaters Anonymous)  If your BMI is greater than 29 or you have gained more than 15 pounds you should work on weight loss.  Attend a healthy cooking class       Renal colic on right side    Patient has right renal colic with nausea and vomiting.  She is very tender in the upper abdomen and in the right flank.  She will be sent to the hospital for further evaluation..       No orders of the defined types were placed in this encounter.   Follow-up: No follow-ups on file.    Cletis Athens, MD

## 2022-07-21 NOTE — Assessment & Plan Note (Signed)

## 2022-07-21 NOTE — Assessment & Plan Note (Signed)
Patient has right renal colic with nausea and vomiting.  She is very tender in the upper abdomen and in the right flank.  She will be sent to the hospital for further evaluation.Marland Kitchen

## 2022-09-22 ENCOUNTER — Ambulatory Visit (INDEPENDENT_AMBULATORY_CARE_PROVIDER_SITE_OTHER): Payer: Medicare Other | Admitting: Internal Medicine

## 2022-09-22 ENCOUNTER — Encounter: Payer: Self-pay | Admitting: Internal Medicine

## 2022-09-22 VITALS — BP 124/84 | HR 82 | Ht 64.0 in | Wt 211.7 lb

## 2022-09-22 DIAGNOSIS — R569 Unspecified convulsions: Secondary | ICD-10-CM | POA: Diagnosis not present

## 2022-09-22 DIAGNOSIS — N23 Unspecified renal colic: Secondary | ICD-10-CM

## 2022-09-22 DIAGNOSIS — Z6833 Body mass index (BMI) 33.0-33.9, adult: Secondary | ICD-10-CM | POA: Diagnosis not present

## 2022-09-22 MED ORDER — LEVETIRACETAM 750 MG PO TABS: 750.0000 mg | ORAL_TABLET | Freq: Two times a day (BID) | ORAL | 1 refills | Status: DC

## 2022-09-22 NOTE — Assessment & Plan Note (Signed)
Stable

## 2022-09-22 NOTE — Assessment & Plan Note (Signed)

## 2022-09-22 NOTE — Assessment & Plan Note (Signed)
We will increase Keppra to 750 mg p.o. twice a day, refer the patient to neurology, patient was advised not to drive for 6 months

## 2022-09-22 NOTE — Progress Notes (Addendum)
Established Patient Office Visit  Subjective:  Patient ID: Kristen Hudson, female    DOB: 1966/03/23  Age: 56 y.o. MRN: [id number]  CC:  Chief Complaint  Patient presents with   Seizures    Patient typically has a seizure once every 4 months, but seems to have had more frequent now. Patient has been on same dose of keppra for over 10 years     Seizures  This is a recurrent problem. The current episode started more than 1 week ago. The problem has not changed since onset.There were 6 to 10 seizures. Associated symptoms include sleepiness, confusion, headaches and muscle weakness. Pertinent negatives include no visual disturbance, no neck stiffness, no chest pain, no cough, no nausea and no vomiting. Characteristics include rhythmic jerking and loss of consciousness. Characteristics do not include bowel incontinence. There has been no fever.  2 seizure last month  [name] presents for   Past Medical History:  Diagnosis Date   Acid reflux    Seizures ([place])     Past Surgical History:  Procedure Laterality Date   APPENDECTOMY     CESAREAN SECTION     CHOLECYSTECTOMY     COLON RESECTION     OVARY SURGERY      Family History  Problem Relation Age of Onset   Breast cancer Mother 24   Colon cancer Father    Diabetes Father    Breast cancer Sister 42   Ovarian cancer Neg Hx     Social History   Socioeconomic History   Marital status: Single    Spouse name: Not on file   Number of children: Not on file   Years of education: Not on file   Highest education level: Not on file  Occupational History   Not on file  Tobacco Use   Smoking status: Every Day    Packs/day: 0.25    Types: Cigarettes   Smokeless tobacco: Never  Vaping Use   Vaping Use: Never used  Substance and Sexual Activity   Alcohol use: Not Currently   Drug use: Not Currently   Sexual activity: Not Currently  Other Topics Concern   Not on file  Social History Narrative   Not on file   Social  Determinants of Health   Financial Resource Strain: Not on file  Food Insecurity: Not on file  Transportation Needs: No Transportation Needs (11/27/2021)   PRAPARE - Transportation    Lack of Transportation (Medical): No    Lack of Transportation (Non-Medical): No  Physical Activity: Inactive (11/27/2021)   Exercise Vital Sign    Days of Exercise per Week: 0 days    Minutes of Exercise per Session: 0 min  Stress: Not on file  Social Connections: Socially Isolated (11/27/2021)   Social Connection and Isolation Panel [NHANES]    Frequency of Communication with Friends and Family: More than three times a week    Frequency of Social Gatherings with Friends and Family: More than three times a week    Attends Religious Services: Never    Marine scientist or Organizations: No    Attends Archivist Meetings: Never    Marital Status: Divorced  Human resources officer Violence: Not At Risk (11/27/2021)   Humiliation, Afraid, Rape, and Kick questionnaire    Fear of Current or Ex-Partner: No    Emotionally Abused: No    Physically Abused: No    Sexually Abused: No     Current Outpatient Medications:    diazepam (VALIUM)  10 MG tablet, Take 1 tablet (10 mg total) by mouth every 12 (twelve) hours as needed for anxiety., Disp: 30 tablet, Rfl: 1   gabapentin (NEURONTIN) 300 MG capsule, TAKE 1 CAPSULE BY MOUTH  TWICE DAILY, Disp: 180 capsule, Rfl: 3   levETIRAcetam (KEPPRA) 750 MG tablet, Take 1 tablet (750 mg total) by mouth 2 (two) times daily., Disp: 60 tablet, Rfl: 1   meloxicam (MOBIC) 15 MG tablet, TAKE 1 TABLET BY MOUTH  DAILY, Disp: 100 tablet, Rfl: 2   methocarbamol (ROBAXIN) 500 MG tablet, Take 1 tablet (500 mg total) by mouth 4 (four) times daily., Disp: 16 tablet, Rfl: 0   omeprazole (PRILOSEC) 20 MG capsule, Take 1 capsule (20 mg total) by mouth daily at 2 PM., Disp: 90 capsule, Rfl: 3   phenytoin (DILANTIN) 125 MG/5ML suspension, Take 125 mg by mouth 2 (two) times daily. , Disp:  , Rfl:    traZODone (DESYREL) 50 MG tablet, TAKE 1 TABLET BY MOUTH  DAILY, Disp: 90 tablet, Rfl: 3   Allergies  Allergen Reactions   Penicillins Anaphylaxis    Did it involve swelling of the face/tongue/throat, SOB, or low BP? No Did it involve sudden or severe rash/hives, skin peeling, or any reaction on the inside of your mouth or nose? No Did you need to seek medical attention at a hospital or doctor's office? No When did it last happen?     teenager  If all above answers are "NO", may proceed with cephalosporin use.     ROS Review of Systems  Constitutional: Negative.   HENT: Negative.    Eyes: Negative.  Negative for visual disturbance.  Respiratory: Negative.  Negative for cough.   Cardiovascular: Negative.  Negative for chest pain.  Gastrointestinal: Negative.  Negative for bowel incontinence, nausea and vomiting.  Endocrine: Negative.   Genitourinary: Negative.   Musculoskeletal: Negative.   Skin: Negative.   Allergic/Immunologic: Negative.   Neurological:  Positive for seizures, loss of consciousness and headaches.  Hematological: Negative.   Psychiatric/Behavioral:  Positive for confusion.   All other systems reviewed and are negative.     Objective:    Physical Exam Vitals reviewed.  Constitutional:      Appearance: Normal appearance.  HENT:     Mouth/Throat:     Mouth: Mucous membranes are moist.  Eyes:     Pupils: Pupils are equal, round, and reactive to light.  Neck:     Vascular: No carotid bruit.  Cardiovascular:     Rate and Rhythm: Normal rate and regular rhythm.     Pulses: Normal pulses.     Heart sounds: Normal heart sounds.  Pulmonary:     Effort: Pulmonary effort is normal.     Breath sounds: Normal breath sounds.  Abdominal:     General: Bowel sounds are normal.     Palpations: Abdomen is soft. There is no hepatomegaly, splenomegaly or mass.     Tenderness: There is no abdominal tenderness.     Hernia: No hernia is present.   Musculoskeletal:        General: No tenderness.     Cervical back: Neck supple.     Right lower leg: No edema.     Left lower leg: No edema.  Skin:    Findings: No rash.  Neurological:     General: No focal deficit present.     Mental Status: She is alert and oriented to person, place, and time. Mental status is at baseline.  Motor: No weakness.     Gait: Gait abnormal.     Comments: Seizure disorder  Psychiatric:        Mood and Affect: Mood and affect normal.        Behavior: Behavior normal.     BP 124/84   Pulse 82   Ht 5' 4" (1.626 m)   Wt 211 lb 11.2 oz (96 kg)   BMI 36.34 kg/m  Wt Readings from Last 3 Encounters:  09/22/22 211 lb 11.2 oz (96 kg)  07/21/22 205 lb 4.8 oz (93.1 kg)  03/14/21 232 lb 6.4 oz (105.4 kg)     Health Maintenance Due  Topic Date Due   Zoster Vaccines- Shingrix (1 of 2) Never done   PAP SMEAR-Modifier  08/24/2021   INFLUENZA VACCINE  Never done    There are no preventive care reminders to display for this patient.  Lab Results  Component Value Date   TSH 1.91 12/05/2021   Lab Results  Component Value Date   WBC 8.6 12/05/2021   HGB 14.6 12/05/2021   HCT 43.0 12/05/2021   MCV 89.4 12/05/2021   PLT 318 12/05/2021   Lab Results  Component Value Date   NA 141 12/05/2021   K 4.3 12/05/2021   CO2 24 12/05/2021   GLUCOSE 103 (H) 12/05/2021   BUN 17 12/05/2021   CREATININE 1.02 12/05/2021   BILITOT 0.5 12/05/2021   ALKPHOS 72 11/05/2019   AST 12 12/05/2021   ALT 10 12/05/2021   PROT 7.1 12/05/2021   ALBUMIN 4.2 11/05/2019   CALCIUM 9.7 12/05/2021   ANIONGAP 7 11/05/2019   EGFR 65 12/05/2021   Lab Results  Component Value Date   CHOL 187 12/05/2021   Lab Results  Component Value Date   HDL 39 (L) 12/05/2021   Lab Results  Component Value Date   LDLCALC 126 (H) 12/05/2021   Lab Results  Component Value Date   TRIG 117 12/05/2021   Lab Results  Component Value Date   CHOLHDL 4.8 12/05/2021   No results  found for: "HGBA1C"    Assessment & Plan:   Problem List Items Addressed This Visit       Other   BMI 33.0-33.9,adult    - I encouraged the patient to lose weight.  - I educated them on making healthy dietary choices including eating more fruits and vegetables and less fried foods. - I encouraged the patient to exercise more, and educated on the benefits of exercise including weight loss, diabetes prevention, and hypertension prevention.   Dietary counseling with a registered dietician  Referral to a weight management support group (e.g. Weight Watchers, Overeaters Anonymous)  If your BMI is greater than 29 or you have gained more than 15 pounds you should work on weight loss.  Attend a healthy cooking class       Renal colic on right side    Stable      Seizures ([place]) - Primary    We will increase Keppra to 750 mg p.o. twice a day, refer the patient to neurology, patient was advised not to drive for 6 months      Relevant Medications   levETIRAcetam (KEPPRA) 750 MG tablet   Other Relevant Orders   EKG 12-Lead   CBC with Differential/Platelet   COMPLETE METABOLIC PANEL WITH GFR   Levetiracetam level   Ambulatory referral to Neurology  Report of the electrocardiogram. Normal sinus rhythm left atrial enlargement no acute changes are noted.  Meds  ordered this encounter  Medications   levETIRAcetam (KEPPRA) 750 MG tablet    Sig: Take 1 tablet (750 mg total) by mouth 2 (two) times daily.    Dispense:  60 tablet    Refill:  1    Follow-up: No follow-ups on file.    Cletis Athens, MD

## 2022-09-23 ENCOUNTER — Encounter: Payer: Self-pay | Admitting: Neurology

## 2022-10-06 ENCOUNTER — Encounter: Payer: Self-pay | Admitting: Internal Medicine

## 2022-10-06 ENCOUNTER — Ambulatory Visit (INDEPENDENT_AMBULATORY_CARE_PROVIDER_SITE_OTHER): Payer: Medicare Other | Admitting: Internal Medicine

## 2022-10-06 ENCOUNTER — Ambulatory Visit: Payer: Medicare Other | Admitting: Internal Medicine

## 2022-10-06 VITALS — BP 117/85 | HR 73 | Ht 64.0 in | Wt 206.7 lb

## 2022-10-06 DIAGNOSIS — N951 Menopausal and female climacteric states: Secondary | ICD-10-CM

## 2022-10-06 DIAGNOSIS — R569 Unspecified convulsions: Secondary | ICD-10-CM

## 2022-10-06 DIAGNOSIS — R350 Frequency of micturition: Secondary | ICD-10-CM | POA: Diagnosis not present

## 2022-10-06 DIAGNOSIS — G8929 Other chronic pain: Secondary | ICD-10-CM | POA: Diagnosis not present

## 2022-10-06 DIAGNOSIS — R102 Pelvic and perineal pain: Secondary | ICD-10-CM | POA: Diagnosis not present

## 2022-10-06 DIAGNOSIS — Z6833 Body mass index (BMI) 33.0-33.9, adult: Secondary | ICD-10-CM

## 2022-10-06 LAB — POCT URINALYSIS DIPSTICK
Appearance: NEGATIVE
Bilirubin, UA: NEGATIVE
Blood, UA: NEGATIVE
Glucose, UA: NEGATIVE
Ketones, UA: NEGATIVE
Leukocytes, UA: NEGATIVE
Nitrite, UA: NEGATIVE
Odor: NEGATIVE
Protein, UA: NEGATIVE
Spec Grav, UA: 1.02 (ref 1.010–1.025)
Urobilinogen, UA: NEGATIVE E.U./dL — AB
pH, UA: 6 (ref 5.0–8.0)

## 2022-10-06 NOTE — Assessment & Plan Note (Signed)

## 2022-10-06 NOTE — Addendum Note (Signed)
Addended by: Lacretia Nicks L on: 10/06/2022 03:05 PM   Modules accepted: Orders

## 2022-10-06 NOTE — Assessment & Plan Note (Signed)
Refer to gynecologist

## 2022-10-06 NOTE — Assessment & Plan Note (Signed)
Patient was advised to lose weight 

## 2022-10-06 NOTE — Progress Notes (Signed)
Established Patient Office Visit  Subjective:  Patient ID: Kristen Hudson, female    DOB: 11-15-1966  Age: 56 y.o. MRN: [id number]  CC:  Chief Complaint  Patient presents with   Seizures    Patient here for 2 week follow up     Seizures     Adleigh Cassels presents for seizure  Past Medical History:  Diagnosis Date   Acid reflux    Seizures ([place])     Past Surgical History:  Procedure Laterality Date   APPENDECTOMY     CESAREAN SECTION     CHOLECYSTECTOMY     COLON RESECTION     OVARY SURGERY      Family History  Problem Relation Age of Onset   Breast cancer Mother 55   Colon cancer Father    Diabetes Father    Breast cancer Sister 45   Ovarian cancer Neg Hx     Social History   Socioeconomic History   Marital status: Single    Spouse name: Not on file   Number of children: Not on file   Years of education: Not on file   Highest education level: Not on file  Occupational History   Not on file  Tobacco Use   Smoking status: Every Day    Packs/day: 0.25    Types: Cigarettes   Smokeless tobacco: Never  Vaping Use   Vaping Use: Never used  Substance and Sexual Activity   Alcohol use: Not Currently   Drug use: Not Currently   Sexual activity: Not Currently  Other Topics Concern   Not on file  Social History Narrative   Not on file   Social Determinants of Health   Financial Resource Strain: Not on file  Food Insecurity: Not on file  Transportation Needs: No Transportation Needs (11/27/2021)   PRAPARE - Transportation    Lack of Transportation (Medical): No    Lack of Transportation (Non-Medical): No  Physical Activity: Inactive (11/27/2021)   Exercise Vital Sign    Days of Exercise per Week: 0 days    Minutes of Exercise per Session: 0 min  Stress: Not on file  Social Connections: Socially Isolated (11/27/2021)   Social Connection and Isolation Panel [NHANES]    Frequency of Communication with Friends and Family: More than three times a week     Frequency of Social Gatherings with Friends and Family: More than three times a week    Attends Religious Services: Never    Marine scientist or Organizations: No    Attends Archivist Meetings: Never    Marital Status: Divorced  Human resources officer Violence: Not At Risk (11/27/2021)   Humiliation, Afraid, Rape, and Kick questionnaire    Fear of Current or Ex-Partner: No    Emotionally Abused: No    Physically Abused: No    Sexually Abused: No     Current Outpatient Medications:    levETIRAcetam (KEPPRA) 750 MG tablet, Take 1 tablet (750 mg total) by mouth 2 (two) times daily., Disp: 60 tablet, Rfl: 1   omeprazole (PRILOSEC) 20 MG capsule, Take 1 capsule (20 mg total) by mouth daily at 2 PM., Disp: 90 capsule, Rfl: 3   Allergies  Allergen Reactions   Penicillins Anaphylaxis    Did it involve swelling of the face/tongue/throat, SOB, or low BP? No Did it involve sudden or severe rash/hives, skin peeling, or any reaction on the inside of your mouth or nose? No Did you need to seek medical attention at  a hospital or doctor's office? No When did it last happen?     teenager  If all above answers are "NO", may proceed with cephalosporin use.     ROS Review of Systems  Constitutional: Negative.   HENT: Negative.    Eyes: Negative.   Respiratory: Negative.    Cardiovascular: Negative.   Gastrointestinal: Negative.   Endocrine: Negative.   Genitourinary: Negative.   Musculoskeletal: Negative.   Skin: Negative.   Allergic/Immunologic: Negative.   Neurological:  Positive for seizures.  Hematological: Negative.   Psychiatric/Behavioral: Negative.    All other systems reviewed and are negative.     Objective:    Physical Exam Vitals reviewed.  Constitutional:      Appearance: Normal appearance.  HENT:     Mouth/Throat:     Mouth: Mucous membranes are moist.  Eyes:     Pupils: Pupils are equal, round, and reactive to light.  Neck:     Vascular: No carotid  bruit.  Cardiovascular:     Rate and Rhythm: Normal rate and regular rhythm.     Pulses: Normal pulses.     Heart sounds: Normal heart sounds.  Pulmonary:     Effort: Pulmonary effort is normal.     Breath sounds: Normal breath sounds.  Abdominal:     General: Bowel sounds are normal.     Palpations: Abdomen is soft. There is no hepatomegaly, splenomegaly or mass.     Tenderness: There is no abdominal tenderness.     Hernia: No hernia is present.  Musculoskeletal:        General: No tenderness.     Cervical back: Neck supple.     Right lower leg: No edema.     Left lower leg: No edema.  Skin:    Findings: No rash.  Neurological:     Mental Status: She is alert and oriented to person, place, and time.     Motor: No weakness.  Psychiatric:        Mood and Affect: Mood and affect normal.        Behavior: Behavior normal.     BP 117/85   Pulse 73   Ht _0  (1.626 m)   Wt 206 lb 11.2 oz (93.8 kg)   BMI 35.48 kg/m  Wt Readings from Last 3 Encounters:  10/06/22 206 lb 11.2 oz (93.8 kg)  09/22/22 211 lb 11.2 oz (96 kg)  07/21/22 205 lb 4.8 oz (93.1 kg)     Health Maintenance Due  Topic Date Due   PAP SMEAR-Modifier  08/24/2021    There are no preventive care reminders to display for this patient.  Lab Results  Component Value Date   TSH 1.91 12/05/2021   Lab Results  Component Value Date   WBC 8.6 12/05/2021   HGB 14.6 12/05/2021   HCT 43.0 12/05/2021   MCV 89.4 12/05/2021   PLT 318 12/05/2021   Lab Results  Component Value Date   NA 141 12/05/2021   K 4.3 12/05/2021   CO2 24 12/05/2021   GLUCOSE 103 (H) 12/05/2021   BUN 17 12/05/2021   CREATININE 1.02 12/05/2021   BILITOT 0.5 12/05/2021   ALKPHOS 72 11/05/2019   AST 12 12/05/2021   ALT 10 12/05/2021   PROT 7.1 12/05/2021   ALBUMIN 4.2 11/05/2019   CALCIUM 9.7 12/05/2021   ANIONGAP 7 11/05/2019   EGFR 65 12/05/2021   Lab Results  Component Value Date   CHOL 187 12/05/2021   Lab Results  Component Value Date   HDL 39 (L) 12/05/2021   Lab Results  Component Value Date   LDLCALC 126 (H) 12/05/2021   Lab Results  Component Value Date   TRIG 117 12/05/2021   Lab Results  Component Value Date   CHOLHDL 4.8 12/05/2021   No results found for: "HGBA1C"    Assessment & Plan:   Problem List Items Addressed This Visit       Other   Chronic pelvic pain in female    Refer to gynecologist      Vasomotor symptoms due to menopause - Primary    Patient was advised to lose weight      BMI 33.0-33.9,adult    - I encouraged the patient to lose weight.  - I educated them on making healthy dietary choices including eating more fruits and vegetables and less fried foods. - I encouraged the patient to exercise more, and educated on the benefits of exercise including weight loss, diabetes prevention, and hypertension prevention.   Dietary counseling with a registered dietician  Referral to a weight management support group (e.g. Weight Watchers, Overeaters Anonymous)  If your BMI is greater than 29 or you have gained more than 15 pounds you should work on weight loss.  Attend a healthy cooking class       Seizures ([place])    Refer to neurologist, patient is going to the hospital to get the [place] level done      Other Visit Diagnoses     Frequent urination       Relevant Orders   POCT urinalysis dipstick (Completed)     Patient was advised not to drive and stop smoking, and try to lose weight, she will be referred to gynecologist for incontinence and postmenopausal symptoms.  No orders of the defined types were placed in this encounter.   Follow-up: No follow-ups on file.    Cletis Athens, MD

## 2022-10-06 NOTE — Assessment & Plan Note (Signed)
Refer to neurologist, patient is going to the hospital to get the [place] level done

## 2022-10-19 ENCOUNTER — Other Ambulatory Visit: Payer: Self-pay | Admitting: *Deleted

## 2022-10-19 MED ORDER — LEVETIRACETAM 750 MG PO TABS: 750.0000 mg | ORAL_TABLET | Freq: Two times a day (BID) | ORAL | 1 refills | Status: DC

## 2022-11-10 ENCOUNTER — Ambulatory Visit: Payer: Medicare Other | Admitting: Internal Medicine

## 2022-11-25 ENCOUNTER — Encounter: Payer: Self-pay | Admitting: Neurology

## 2022-11-25 ENCOUNTER — Other Ambulatory Visit (INDEPENDENT_AMBULATORY_CARE_PROVIDER_SITE_OTHER): Payer: Medicare Other

## 2022-11-25 ENCOUNTER — Ambulatory Visit (INDEPENDENT_AMBULATORY_CARE_PROVIDER_SITE_OTHER): Payer: Medicare Other | Admitting: Neurology

## 2022-11-25 VITALS — BP 122/78 | HR 98 | Ht 64.0 in | Wt 204.0 lb

## 2022-11-25 DIAGNOSIS — G40209 Localization-related (focal) (partial) symptomatic epilepsy and epileptic syndromes with complex partial seizures, not intractable, without status epilepticus: Secondary | ICD-10-CM | POA: Diagnosis not present

## 2022-11-25 DIAGNOSIS — R413 Other amnesia: Secondary | ICD-10-CM

## 2022-11-25 DIAGNOSIS — Z87898 Personal history of other specified conditions: Secondary | ICD-10-CM | POA: Diagnosis not present

## 2022-11-25 DIAGNOSIS — Q078 Other specified congenital malformations of nervous system: Secondary | ICD-10-CM

## 2022-11-25 NOTE — Progress Notes (Signed)
NEUROLOGY CONSULTATION NOTE  Kristen Hudson MRN: [id number] DOB: 07-Jun-1966  Referring provider: Dr. Cletis Athens Primary care provider: Dr. Cletis [place]  Reason for consult:  seizures  Dear Dr Lavera Guise:  Thank you for your kind referral of Kristen Hudson for consultation of the above symptoms. Although her history is well known to you, please allow me to reiterate it for the purpose of our medical record. The patient was accompanied to the clinic by her niece Mickel Baas who also provides collateral information. Records and images were personally reviewed where available.   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 Records were reviewed. She describes seizures as starting with left arm shaking, her lip would shake, then she loses consciousness and would not remember anything. No tongue bite or incontinence. Mickel Baas has witnessed the seizures and notes that she would stare off or alert family that she feels something coming on, sits, then her left arm then whole body shakes with eyes closed, head turned to the left side. It would last a few minutes and when she comes to, she repeatedly asks for water. She thinks she also has nocturnal seizures. She has seizures every few months, last seizure was at church in August. When she came back, she "went back into it" and had 4 so they called EMS. She denies any olfactory/gustatory hallucinations, deja vu, rising epigastric sensation, focal numbness/tingling/weakness, myoclonic jerks. She was previously admitted at [facility] in 2014 for dizziness. During her stay, she started having recurrent episodes of left-sided shaking. Neurology notes reviewed, she had 2 events captured with irregular shaking of head and left arm lasting 3-5 minutes. She did not  respond and kept her eyes closed. EEG did not show any epileptiform correlate. Baseline showed mild diffuse slowing, no epileptiform abnormalities. She continued to have recurrent episodes during her hospitalization with irregular side to side head movements, side to side shaking movements of her left arm, and similar side to side shaking movements of both legs, eyes closed. She was admitted to inpatient psychiatry after with a diagnosis of depression with somatization. 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 Normal hippocampi, mild chronic microvascular disease. Due to abnormalities on brain MRI, she was continued on seizure medication, taking Levetiracetam '750mg'$  BID (dose increased in 08/2022 by PCP). She denies any side effects on medication and denies any seizures since increase in dose.   She denies any headaches, dizziness, diplopia, dysarthria/dysphagia, neck pain, bowel/bladder dysfunction. SHe has back pain. She reports poor sleep with a total of 3 hours of interrupted sleep. She feels tired all the time. Mickel Baas is concerned about her memory getting worse. Patient reports memory is not good. She lives alone. Mickel Baas takes her to pay bills and does not forget payments. A couple of months ago she left the stove on. She has not been driving since August and denies getting lost. She denies missing medications. She repeats herself a lot. Mickel Baas has noticed over the past year that she talks to herself and answers herself. She would ask yes/no questions, Mickel Baas answers, then she does the opposite thing. She does not remember simple things such as instructions where to sign a document.  Mickel Baas reports she is very moody frequently, which is new in the past few months. She was previously taking over the counter "mood pills" for menopause which were not  helping. Years ago she reports taking an antidepressant while in [place] but she had visual hallucinations (seeing faces) and stopped medication. 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 She was in special education classes and finished the 8th grade. There is no history of febrile convulsions, CNS infections such as meningitis/encephalitis, significant traumatic brain injury, neurosurgical procedures, or family history of seizures.  Prior AEDs: Dilantin Laboratory Data:  EEGs: MRI:   PAST MEDICAL HISTORY: Past Medical History:  Diagnosis Date   Acid reflux    Seizures ([place])     PAST SURGICAL HISTORY: Past Surgical History:  Procedure Laterality Date   APPENDECTOMY     CESAREAN SECTION     CHOLECYSTECTOMY     COLON RESECTION     OVARY SURGERY      MEDICATIONS: Current Outpatient Medications on File Prior to Visit  Medication Sig Dispense Refill   levETIRAcetam (KEPPRA) 750 MG tablet Take 1 tablet (750 mg total) by mouth 2 (two) times daily. 180 tablet 1   No current facility-administered medications on file prior to visit.    ALLERGIES: Allergies  Allergen Reactions   Penicillins Anaphylaxis    Did it involve swelling of the face/tongue/throat, SOB, or low BP? No Did it involve sudden or severe rash/hives, skin peeling, or any reaction on the inside of your mouth or nose? No Did you need to seek medical attention at a hospital or doctor's office? No When did it last happen?     teenager  If all above answers are "NO", may proceed with cephalosporin use.     FAMILY HISTORY: Family History  Problem Relation Age of Onset   Dementia Mother    Breast cancer Mother 21   Colon cancer Father    Diabetes Father    Lung cancer Father    Breast cancer Sister 34   Ovarian cancer Neg Hx     SOCIAL HISTORY: Social History    Socioeconomic History   Marital status: Single    Spouse name: Not on file   Number of children: Not on file   Years of education: Not on file   Highest education level: Not on file  Occupational History   Not on file  Tobacco Use   Smoking status: Every Day    Packs/day: 0.25    Types: Cigarettes   Smokeless tobacco: Never  Vaping Use   Vaping Use: Never used  Substance and Sexual Activity   Alcohol use: Not Currently   Drug use: Not Currently   Sexual activity: Not Currently  Other Topics Concern   Not on file  Social History Narrative   Are you right handed or left handed? Left Handed    Are you currently employed ? No    What is your current occupation? No   Do you live at home alone? yes   Who lives with you?    What type of home do you live in: 1 story or 2 story? Lives in an apartment        Social Determinants of Health   Financial Resource Strain: Not on file  Food Insecurity: Not on file  Transportation Needs: No Transportation Needs (11/27/2021)   PRAPARE -  Hydrologist (Medical): No    Lack of Transportation (Non-Medical): No  Physical Activity: Inactive (11/27/2021)   Exercise Vital Sign    Days of Exercise per Week: 0 days    Minutes of Exercise per Session: 0 min  Stress: Not on file  Social Connections: Socially Isolated (11/27/2021)   Social Connection and Isolation Panel [NHANES]    Frequency of Communication with Friends and Family: More than three times a week    Frequency of Social Gatherings with Friends and Family: More than three times a week    Attends Religious Services: Never    Marine scientist or Organizations: No    Attends Archivist Meetings: Never    Marital Status: Divorced  Human resources officer Violence: Not At Risk (11/27/2021)   Humiliation, Afraid, Rape, and Kick questionnaire    Fear of Current or Ex-Partner: No    Emotionally Abused: No    Physically Abused: No    Sexually Abused:  No     PHYSICAL EXAM: Vitals:   11/25/22 1408  BP: 122/78  Pulse: 98  SpO2: 96%   General: No acute distress Head:  Normocephalic/atraumatic, edentulous Skin/Extremities: No rash, no edema Neurological Exam: Mental status: alert and awake, no dysarthria or aphasia, Fund of knowledge is appropriate.  Recent and remote memory impaired. Attention and concentration are normal.    Able to name objects, difficulty with repetition. MMSE 24/30.    11/25/2022   10:00 PM 11/27/2021   10:05 PM  MMSE - Mini Mental State Exam  Not completed:  Unable to complete  Orientation to time 5   Orientation to Place 5   Registration 3   Attention/ Calculation 5   Recall 0   Language- name 2 objects 2   Language- repeat 0   Language- follow 3 step command 2   Language- read & follow direction 1   Write a sentence 1   Copy design 0   Total score 24     Cranial nerves: CN I: not tested CN II: pupils equal, round, visual fields intact CN III, IV, VI:  full range of motion, no nystagmus, no ptosis CN V: facial sensation intact CN VII: upper and lower face symmetric CN VIII: hearing intact to conversation Bulk & Tone: normal, no fasciculations. Motor: 5/5 throughout with no pronator drift. Sensation: intact to light touch, cold, pin, vibration sense.  No extinction to double simultaneous stimulation.  Romberg test negative Deep Tendon Reflexes: +2 throughout Cerebellar: no incoordination on finger to nose testing Gait: narrow-based and steady, able to tandem walk adequately. 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 She does have risk factors for seizure with gray matter heterotopia along the right lateral ventricle, however she had psychogenic non-epileptic events with left-sided irregular  shaking and eyes closed, which are still how they describe her current seizures. She has not had any seizures since Levetiracetam increased to '750mg'$  BID, last seizure 07/2022. Family also concerned about memory loss. MMSE today 24/30. Check TSH, B12. MRI brain with and without contrast and EEG will be ordered. We discussed mood swings and how memory changes can also be seen with mood changes (pseudodementia). She was previously diagnosed with depression with somatization, discuss starting SSRI with PCP. [place] driving laws were discussed with  the patient, and she knows to stop driving after a seizure, until 6 months seizure-free. Follow-up in 3 months, call for any changes.    Thank you for allowing me to participate in the care of this patient. Please do not hesitate to call for any questions or concerns.   Ellouise Newer, M.D.  CC: Dr. Lavera Guise

## 2022-11-25 NOTE — Patient Instructions (Signed)
Good to meet you.  Have bloodwork done for TSH, B12  2. Schedule MRI brain with and without contrast  3. Schedule EEG  4. Discuss starting mood medication with PCP  5. Follow-up in 3 months, call for any changes   Seizure Precautions: 1. If medication has been prescribed for you to prevent seizures, take it exactly as directed.  Do not stop taking the medicine without talking to your doctor first, even if you have not had a seizure in a long time.   2. Avoid activities in which a seizure would cause danger to yourself or to others.  Don't operate dangerous machinery, swim alone, or climb in high or dangerous places, such as on ladders, roofs, or girders.  Do not drive unless your doctor says you may.  3. If you have any warning that you may have a seizure, lay down in a safe place where you can't hurt yourself.    4.  No driving for 6 months from last seizure, as per [facility].   Please refer to the following link on the [facility] website for more information: http://www.epilepsyfoundation.org/answerplace/Social/driving/drivingu.cfm   5.  Maintain good sleep hygiene. Avoid alcohol.  6.  Contact your doctor if you have any problems that may be related to the medicine you are taking.  7.  Call 911 and bring the patient back to the ED if:        A.  The seizure lasts longer than 5 minutes.       B.  The patient doesn't awaken shortly after the seizure  C.  The patient has new problems such as difficulty seeing, speaking or moving  D.  The patient was injured during the seizure  E.  The patient has a temperature over 102 F (39C)  F.  The patient vomited and now is having trouble breathing

## 2022-11-26 LAB — VITAMIN B12: Vitamin B-12: 432 pg/mL (ref 211–911)

## 2022-11-26 LAB — TSH: TSH: 1.14 u[IU]/mL (ref 0.35–5.50)

## 2022-11-27 ENCOUNTER — Telehealth: Payer: Self-pay

## 2022-11-27 NOTE — Telephone Encounter (Signed)
-----   Message from Cameron Sprang, MD sent at 11/26/2022  3:58 PM EST ----- Pls let patient/niece know the bloodwork is normal, thanks

## 2022-11-27 NOTE — Telephone Encounter (Signed)
Pt called informed bloodwork is normal,

## 2022-12-07 ENCOUNTER — Ambulatory Visit (INDEPENDENT_AMBULATORY_CARE_PROVIDER_SITE_OTHER): Payer: Medicare Other | Admitting: Neurology

## 2022-12-07 DIAGNOSIS — Q078 Other specified congenital malformations of nervous system: Secondary | ICD-10-CM

## 2022-12-07 DIAGNOSIS — G40209 Localization-related (focal) (partial) symptomatic epilepsy and epileptic syndromes with complex partial seizures, not intractable, without status epilepticus: Secondary | ICD-10-CM | POA: Diagnosis not present

## 2022-12-07 NOTE — Progress Notes (Signed)
EEG complete - results pending 

## 2022-12-14 NOTE — Procedures (Signed)
ELECTROENCEPHALOGRAM REPORT  Date of Study: 12/07/2022  Patient's Name: Kristen Hudson MRN: [id number] Date of Birth: 11-27-1966  Referring Provider: Dr. Ellouise Newer  Clinical History: This is a 56 year old woman with recurrent seizures, memory loss. EEG for classification.  Medications: Keppra  Technical Summary: A multichannel digital EEG recording measured by the international 10-20 system with electrodes applied with paste and impedances below 5000 ohms performed in our laboratory with EKG monitoring in an awake and asleep patient.  Hyperventilation and photic stimulation were performed.  The digital EEG was referentially recorded, reformatted, and digitally filtered in a variety of bipolar and referential montages for optimal display.    Description: The patient is awake and asleep during the recording.  During maximal wakefulness, there is a symmetric, medium voltage 9 Hz posterior dominant rhythm that attenuates with eye opening.  The record is symmetric.  During drowsiness and sleep, there is an increase in theta slowing of the background.  Vertex waves and symmetric sleep spindles were seen. Hyperventilation and photic stimulation did not elicit any abnormalities.  There were no epileptiform discharges or electrographic seizures seen.    EKG lead was unremarkable.  Impression: This awake and asleep EEG is normal.    Clinical Correlation: A normal EEG does not exclude a clinical diagnosis of epilepsy.  If further clinical questions remain, prolonged EEG may be helpful.  Clinical correlation is advised.   Ellouise Newer, M.D.

## 2022-12-23 ENCOUNTER — Ambulatory Visit (INDEPENDENT_AMBULATORY_CARE_PROVIDER_SITE_OTHER): Payer: Medicare Other | Admitting: Family Medicine

## 2022-12-23 ENCOUNTER — Encounter: Payer: Self-pay | Admitting: Family Medicine

## 2022-12-23 ENCOUNTER — Other Ambulatory Visit (HOSPITAL_COMMUNITY)
Admission: RE | Admit: 2022-12-23 | Discharge: 2022-12-23 | Disposition: A | Discharge status: Home / Self Care | Payer: Medicare Other | Source: Ambulatory Visit | Attending: Family Medicine | Admitting: Family Medicine

## 2022-12-23 VITALS — BP 109/75 | HR 78 | Ht 64.0 in | Wt 203.0 lb

## 2022-12-23 DIAGNOSIS — R102 Pelvic and perineal pain: Secondary | ICD-10-CM

## 2022-12-23 DIAGNOSIS — Z01419 Encounter for gynecological examination (general) (routine) without abnormal findings: Secondary | ICD-10-CM | POA: Insufficient documentation

## 2022-12-23 DIAGNOSIS — Z1231 Encounter for screening mammogram for malignant neoplasm of breast: Secondary | ICD-10-CM

## 2022-12-23 DIAGNOSIS — Z1151 Encounter for screening for human papillomavirus (HPV): Secondary | ICD-10-CM | POA: Diagnosis not present

## 2022-12-23 DIAGNOSIS — Z1211 Encounter for screening for malignant neoplasm of colon: Secondary | ICD-10-CM | POA: Diagnosis not present

## 2022-12-23 DIAGNOSIS — G8929 Other chronic pain: Secondary | ICD-10-CM

## 2022-12-23 DIAGNOSIS — Z124 Encounter for screening for malignant neoplasm of cervix: Secondary | ICD-10-CM | POA: Insufficient documentation

## 2022-12-23 NOTE — Assessment & Plan Note (Addendum)
99203 - Given that she has had surgical menopause and most uterine pain is related to hormone changes with endometriosis or adenomyosis or fibroids, doubt it is the cause of her pain. She has also had 8 abdominal surgeries and so risks of surgeries are higher. We discussed possibility of injury to other organs with her surgical hx and we decided that since pain is so less likely to be uterine related, we should not pursue hysterectomy at this point. Will have her see Urology for possible IC as a diagnosis.

## 2022-12-23 NOTE — Progress Notes (Signed)
Subjective:     Kristen Hudson is a 56 y.o. female and is here for a comprehensive physical exam. The patient reports problems - pelvic pain and would like a hysterectomy for the same . Reports constant pelvic pain. Had her ovaries removed years ago. Has had constant pain since before then. She has had C-section x 3, appy, cholecystectomy, colon surgery, lap BSO removed in 2 separate surgeries.  Still having some hot flashes. Reports pain started with recurrent UTI's as a child though has no dysuria at present. Does leak with coughing and sneezing and notes some urge incontinence.  The following portions of the patient's history were reviewed and updated as appropriate: allergies, current medications, past family history, past medical history, past social history, past surgical history, and problem list.  Review of Systems Pertinent items noted in HPI and remainder of comprehensive ROS otherwise negative.   Objective:    BP 109/75   Pulse 78   Ht '5\' 4"'$  (1.626 m)   Wt 203 lb (92.1 kg)   BMI 34.84 kg/m  General appearance: alert, cooperative, and appears stated age Head: Normocephalic, without obvious abnormality, atraumatic Neck: no adenopathy, supple, symmetrical, trachea midline, and thyroid not enlarged, symmetric, no tenderness/mass/nodules Lungs: clear to auscultation bilaterally Breasts: normal appearance, no masses or tenderness, right nipple inverted, always this way, per patient Heart: regular rate and rhythm, S1, S2 normal, no murmur, click, rub or gallop Abdomen: soft, non-tender; bowel sounds normal; no masses,  no organomegaly Pelvic: external genitalia normal, vagina normal without discharge, and small uterus diffuse anterior tenderness Extremities: extremities normal, atraumatic, no cyanosis or edema Pulses: 2+ and symmetric Skin: Skin color, texture, turgor normal. No rashes or lesions Lymph nodes: Cervical, supraclavicular, and axillary nodes normal. Neurologic: Grossly  normal    Assessment:    GYN female exam.      Plan:   Problem List Items Addressed This Visit       Unprioritized   Chronic pelvic pain in female    Given that she has had surgical menopause and most uterine pain is related to hormone changes with endometriosis or adenomyosis or fibroids, doubt it is the cause of her pain. She has also had 8 abdominal surgeries and so risks of surgeries are higher. We discussed possibility of injury to other organs with her surgical hx and we decided that since pain is so less likely to be uterine related, we should not pursue hysterectomy at this point. Will have her see Urology for possible IC as a diagnosis.      Relevant Orders   Ambulatory referral to Urology   Other Visit Diagnoses     Screening for malignant neoplasm of cervix    -  Primary   [phone]   Relevant Orders   Cytology - PAP   Encounter for gynecological examination without abnormal finding       99386   Screen for colon cancer       [phone]   Relevant Orders   Ambulatory referral to Gastroenterology   Encounter for screening mammogram for malignant neoplasm of breast       99386   Relevant Orders   MM 3D SCREEN BREAST BILATERAL       Return in 1 year (on 12/24/2023).    See After Visit Summary for Counseling Recommendations

## 2022-12-24 ENCOUNTER — Ambulatory Visit
Admission: RE | Admit: 2022-12-24 | Discharge: 2022-12-24 | Disposition: A | Discharge status: Home / Self Care | Payer: Medicare Other | Source: Ambulatory Visit | Attending: Neurology | Admitting: Neurology

## 2022-12-24 DIAGNOSIS — I739 Peripheral vascular disease, unspecified: Secondary | ICD-10-CM | POA: Diagnosis not present

## 2022-12-24 DIAGNOSIS — G40209 Localization-related (focal) (partial) symptomatic epilepsy and epileptic syndromes with complex partial seizures, not intractable, without status epilepticus: Secondary | ICD-10-CM

## 2022-12-24 DIAGNOSIS — Q078 Other specified congenital malformations of nervous system: Secondary | ICD-10-CM

## 2022-12-24 DIAGNOSIS — R569 Unspecified convulsions: Secondary | ICD-10-CM | POA: Diagnosis not present

## 2022-12-24 LAB — CYTOLOGY - PAP
Comment: NEGATIVE
Diagnosis: NEGATIVE
High risk HPV: NEGATIVE

## 2022-12-24 MED ORDER — GADOBENATE DIMEGLUMINE 529 MG/ML IV SOLN
19.0000 mL | Freq: Once | INTRAVENOUS | Status: AC | PRN
  Administered 2022-12-24: 19 mL via INTRAVENOUS

## 2023-01-06 ENCOUNTER — Other Ambulatory Visit: Payer: Self-pay

## 2023-01-06 ENCOUNTER — Telehealth: Payer: Self-pay

## 2023-01-06 ENCOUNTER — Ambulatory Visit
Admission: RE | Admit: 2023-01-06 | Discharge: 2023-01-06 | Disposition: A | Discharge status: Home / Self Care | Payer: 59 | Source: Ambulatory Visit | Attending: Family Medicine | Admitting: Family Medicine

## 2023-01-06 DIAGNOSIS — Z1231 Encounter for screening mammogram for malignant neoplasm of breast: Secondary | ICD-10-CM

## 2023-01-06 DIAGNOSIS — Z1211 Encounter for screening for malignant neoplasm of colon: Secondary | ICD-10-CM

## 2023-01-06 MED ORDER — NA SULFATE-K SULFATE-MG SULF 17.5-3.13-1.6 GM/177ML PO SOLN: 1.0000 | Freq: Once | ORAL | 0 refills | Status: AC

## 2023-01-06 NOTE — Telephone Encounter (Signed)
Patient called you back to schedule her colonoscopy. Please give her a call back when you have the chance. Thank you.

## 2023-01-06 NOTE — Telephone Encounter (Signed)
Gastroenterology Pre-Procedure Review  Request Date: 01/20/23 Requesting Physician: Dr. Vicente Males  PATIENT REVIEW QUESTIONS: The patient responded to the following health history questions as indicated:    1. Are you having any GI issues?  Hemorrhoids, history of hernia surgery 2. Do you have a personal history of Polyps?  no 3. Do you have a family history of Colon Cancer or Polyps? no 4. Diabetes Mellitus? no 5. Joint replacements in the past 12 months?no 6. Major health problems in the past 3 months?no 7. Any artificial heart valves, MVP, or defibrillator?no    MEDICATIONS & ALLERGIES:    Patient reports the following regarding taking any anticoagulation/antiplatelet therapy:   Plavix, Coumadin, Eliquis, Xarelto, Lovenox, Pradaxa, Brilinta, or Effient? no Aspirin? no  Patient confirms/reports the following medications:  Current Outpatient Medications  Medication Sig Dispense Refill   levETIRAcetam (KEPPRA) 750 MG tablet Take 1 tablet (750 mg total) by mouth 2 (two) times daily. 180 tablet 1   No current facility-administered medications for this visit.    Patient confirms/reports the following allergies:  Allergies  Allergen Reactions   Penicillins Anaphylaxis    Did it involve swelling of the face/tongue/throat, SOB, or low BP? No Did it involve sudden or severe rash/hives, skin peeling, or any reaction on the inside of your mouth or nose? No Did you need to seek medical attention at a hospital or doctor's office? No When did it last happen?     teenager  If all above answers are "NO", may proceed with cephalosporin use.     No orders of the defined types were placed in this encounter.   AUTHORIZATION INFORMATION Primary Insurance: 1D#: Group #:  Secondary Insurance: 1D#: Group #:  SCHEDULE INFORMATION: Date: 01/20/23 Time: Location: [facility]

## 2023-01-09 ENCOUNTER — Other Ambulatory Visit: Payer: Self-pay | Admitting: Internal Medicine

## 2023-01-14 ENCOUNTER — Telehealth: Payer: Self-pay

## 2023-01-14 NOTE — Telephone Encounter (Signed)
Pt called informed brain MRI looks fine, no tumor, stroke, or bleed. No changes from her prior scan in 2020. Continue all medications.

## 2023-01-14 NOTE — Telephone Encounter (Signed)
-----  Message from Cameron Sprang, MD sent at 01/14/2023  1:19 PM EST ----- Pls let her know brain MRI looks fine, no tumor, stroke, or bleed. No changes from her prior scan in 2020. Continue all medications. Thanks

## 2023-01-20 ENCOUNTER — Encounter
Admission: RE | Disposition: A | Discharge status: Home / Self Care | Payer: Self-pay | Source: Home / Self Care | Attending: Gastroenterology

## 2023-01-20 ENCOUNTER — Ambulatory Visit: Payer: 59 | Admitting: Anesthesiology

## 2023-01-20 ENCOUNTER — Ambulatory Visit
Admission: RE | Admit: 2023-01-20 | Discharge: 2023-01-20 | Disposition: A | Discharge status: Home / Self Care | Payer: 59 | Attending: Gastroenterology | Admitting: Gastroenterology

## 2023-01-20 DIAGNOSIS — K219 Gastro-esophageal reflux disease without esophagitis: Secondary | ICD-10-CM | POA: Insufficient documentation

## 2023-01-20 DIAGNOSIS — F17210 Nicotine dependence, cigarettes, uncomplicated: Secondary | ICD-10-CM | POA: Insufficient documentation

## 2023-01-20 DIAGNOSIS — E669 Obesity, unspecified: Secondary | ICD-10-CM | POA: Insufficient documentation

## 2023-01-20 DIAGNOSIS — Z800 Family history of malignant neoplasm of digestive organs: Secondary | ICD-10-CM | POA: Insufficient documentation

## 2023-01-20 DIAGNOSIS — D125 Benign neoplasm of sigmoid colon: Secondary | ICD-10-CM | POA: Diagnosis not present

## 2023-01-20 DIAGNOSIS — D122 Benign neoplasm of ascending colon: Secondary | ICD-10-CM | POA: Insufficient documentation

## 2023-01-20 DIAGNOSIS — D126 Benign neoplasm of colon, unspecified: Secondary | ICD-10-CM

## 2023-01-20 DIAGNOSIS — Z1211 Encounter for screening for malignant neoplasm of colon: Secondary | ICD-10-CM | POA: Diagnosis not present

## 2023-01-20 DIAGNOSIS — K635 Polyp of colon: Secondary | ICD-10-CM | POA: Diagnosis not present

## 2023-01-20 DIAGNOSIS — R569 Unspecified convulsions: Secondary | ICD-10-CM | POA: Diagnosis not present

## 2023-01-20 DIAGNOSIS — Z6834 Body mass index (BMI) 34.0-34.9, adult: Secondary | ICD-10-CM | POA: Insufficient documentation

## 2023-01-20 HISTORY — PX: COLONOSCOPY WITH PROPOFOL: SHX5780

## 2023-01-20 SURGERY — COLONOSCOPY WITH PROPOFOL
Anesthesia: General

## 2023-01-20 MED ORDER — LACTATED RINGERS IV SOLN: INTRAVENOUS | Status: DC | PRN

## 2023-01-20 MED ORDER — ONDANSETRON HCL 4 MG/2ML IJ SOLN
INTRAMUSCULAR | Status: DC | PRN
  Administered 2023-01-20: 4 mg via INTRAVENOUS

## 2023-01-20 MED ORDER — PROPOFOL 10 MG/ML IV BOLUS
INTRAVENOUS | Status: AC
  Filled 2023-01-20: qty 20

## 2023-01-20 MED ORDER — PROPOFOL 500 MG/50ML IV EMUL
INTRAVENOUS | Status: DC | PRN
  Administered 2023-01-20: 150 ug/kg/min via INTRAVENOUS
  Administered 2023-01-20: 50 mg via INTRAVENOUS
  Administered 2023-01-20: 80 mg via INTRAVENOUS

## 2023-01-20 MED ORDER — SODIUM CHLORIDE 0.9 % IV SOLN: INTRAVENOUS | Status: DC

## 2023-01-20 NOTE — Anesthesia Postprocedure Evaluation (Signed)
Anesthesia Post Note  Patient: Kristen Hudson  Procedure(s) Performed: COLONOSCOPY WITH PROPOFOL  Patient location during evaluation: PACU Anesthesia Type: General Level of consciousness: awake and alert, oriented and patient cooperative Pain management: pain level controlled Vital Signs Assessment: post-procedure vital signs reviewed and stable Respiratory status: spontaneous breathing, nonlabored ventilation and respiratory function stable Cardiovascular status: blood pressure returned to baseline and stable Postop Assessment: adequate PO intake Anesthetic complications: no   No notable events documented.   Last Vitals:  Vitals:   01/20/23 0945 01/20/23 0955  BP: 104/79 116/72  Pulse: 82 81  Resp: 18 (!) 26  Temp:    SpO2: 98% 98%    Last Pain:  Vitals:   01/20/23 0945  TempSrc:   PainSc: 0-No pain                 Darrin Nipper

## 2023-01-20 NOTE — Transfer of Care (Signed)
Immediate Anesthesia Transfer of Care Note  Patient: Kristen Hudson  Procedure(s) Performed: COLONOSCOPY WITH PROPOFOL  Patient Location: PACU  Anesthesia Type:MAC  Level of Consciousness: awake  Airway & Oxygen Therapy: Patient Spontanous Breathing and Patient connected to nasal cannula oxygen  Post-op Assessment: Report given to RN and Post -op Vital signs reviewed and stable  Post vital signs: Reviewed  Last Vitals:  Vitals Value Taken Time  BP 102/69 01/20/23 0935  Temp 35.9 C 01/20/23 0935  Pulse 84 01/20/23 0940  Resp 21 01/20/23 0940  SpO2 99 % 01/20/23 0940  Vitals shown include unvalidated device data.  Last Pain:  Vitals:   01/20/23 0935  TempSrc: Temporal  PainSc: 0-No pain         Complications: No notable events documented.

## 2023-01-20 NOTE — Op Note (Signed)
[facility] Gastroenterology Patient Name: Kristen Hudson Procedure Date: 01/20/2023 8:52 AM MRN: [id number] Account #: [id number] Date of Birth: 08-05-1966 Admit Type: Outpatient Age: 57 Room: [facility] ENDO ROOM 3 Gender: Female Note Status: Finalized Instrument Name: Jasper Riling [id number] Procedure:             Colonoscopy Indications:           Screening for colorectal malignant neoplasm Providers:             Jonathon Bellows MD, MD Medicines:             Monitored Anesthesia Care Complications:         No immediate complications. Procedure:             Pre-Anesthesia Assessment:                        - Prior to the procedure, a History and Physical was                         performed, and patient medications, allergies and                         sensitivities were reviewed. The patient's tolerance                         of previous anesthesia was reviewed.                        - The risks and benefits of the procedure and the                         sedation options and risks were discussed with the                         patient. All questions were answered and informed                         consent was obtained.                        - ASA Grade Assessment: II - A patient with mild                         systemic disease.                        After obtaining informed consent, the colonoscope was                         passed under direct vision. Throughout the procedure,                         the patient's blood pressure, pulse, and oxygen                         saturations were monitored continuously. The                         Colonoscope was introduced through the anus and  advanced to the the cecum, identified by the                         appendiceal orifice. The colonoscopy was performed                         without difficulty. The patient tolerated the                         procedure well. The quality of the bowel  preparation                         was excellent. The ileocecal valve, appendiceal                         orifice, and rectum were photographed. Findings:      The perianal and digital rectal examinations were normal.      Two sessile polyps were found in the sigmoid colon and ascending colon.       The polyps were 5 to 7 mm in size. These polyps were removed with a cold       snare. Resection and retrieval were complete.      A 3 mm polyp was found in the sigmoid colon. The polyp was sessile. The       polyp was removed with a cold biopsy forceps. Resection and retrieval       were complete.      No additional abnormalities were found on retroflexion.      The exam was otherwise without abnormality on direct and retroflexion       views. Impression:            - Two 5 to 7 mm polyps in the sigmoid colon and in the                         ascending colon, removed with a cold snare. Resected                         and retrieved.                        - One 3 mm polyp in the sigmoid colon, removed with a                         cold biopsy forceps. Resected and retrieved.                        - The examination was otherwise normal on direct and                         retroflexion views. Recommendation:        - Discharge patient to home (with escort).                        - Resume previous diet.                        - Continue present medications.                        -  Await pathology results.                        - Repeat colonoscopy for surveillance based on                         pathology results. Procedure Code(s):     --- Professional ---                        [phone], Colonoscopy, flexible; with removal of                         tumor(s), polyp(s), or other lesion(s) by snare                         technique                        45380, 10, Colonoscopy, flexible; with biopsy, single                         or multiple Diagnosis Code(s):     --- Professional  ---                        Z12.11, Encounter for screening for malignant neoplasm                         of colon                        D12.5, Benign neoplasm of sigmoid colon                        D12.2, Benign neoplasm of ascending colon CPT copyright 2022 American Medical Association. All rights reserved. The codes documented in this report are preliminary and upon coder review may  be revised to meet current compliance requirements. Jonathon Bellows, MD Jonathon Bellows MD, MD 01/20/2023 9:32:12 AM This report has been signed electronically. Number of Addenda: 0 Note Initiated On: 01/20/2023 8:52 AM Scope Withdrawal Time: 0 hours 11 minutes 45 seconds  Total Procedure Duration: 0 hours 14 minutes 10 seconds  Estimated Blood Loss:  Estimated blood loss: none.      [facility]

## 2023-01-20 NOTE — Anesthesia Preprocedure Evaluation (Addendum)
Anesthesia Evaluation  Patient identified by MRN, date of birth, ID band Patient awake    Reviewed: Allergy & Precautions, NPO status , Patient's Chart, lab work & pertinent test results  History of Anesthesia Complications Negative for: history of anesthetic complications  Airway Mallampati: III   Neck ROM: Full    Dental  (+) Edentulous Upper, Edentulous Lower   Pulmonary Current Smoker (1-2 cigarettes per day) and Patient abstained from smoking.   Pulmonary exam normal breath sounds clear to auscultation       Cardiovascular Exercise Tolerance: Good negative cardio ROS Normal cardiovascular exam Rhythm:Regular Rate:Normal     Neuro/Psych Seizures - (last sz 12/27/22),     GI/Hepatic ,GERD  ,,  Endo/Other  Obesity   Renal/GU negative Renal ROS     Musculoskeletal   Abdominal   Peds  Hematology negative hematology ROS (+)   Anesthesia Other Findings   Reproductive/Obstetrics                             Anesthesia Physical Anesthesia Plan  ASA: 2  Anesthesia Plan: General   Post-op Pain Management:    Induction: Intravenous  PONV Risk Score and Plan: 2 and Propofol infusion, TIVA and Treatment may vary due to age or medical condition  Airway Management Planned: Natural Airway  Additional Equipment:   Intra-op Plan:   Post-operative Plan:   Informed Consent: I have reviewed the patients History and Physical, chart, labs and discussed the procedure including the risks, benefits and alternatives for the proposed anesthesia with the patient or authorized representative who has indicated his/her understanding and acceptance.       Plan Discussed with: CRNA  Anesthesia Plan Comments: (LMA/GETA backup discussed.  Patient consented for risks of anesthesia including but not limited to:  - adverse reactions to medications - damage to eyes, teeth, lips or other oral mucosa -  nerve damage due to positioning  - sore throat or hoarseness - damage to heart, brain, nerves, lungs, other parts of body or loss of life  Informed patient about role of CRNA in peri- and intra-operative care.  Patient voiced understanding.)       Anesthesia Quick Evaluation

## 2023-01-20 NOTE — H&P (Signed)
Jonathon Bellows, MD [address], [place], [place], [place], [address] [address], [place], [place], [place], [address] Phone: [phone]  Fax: [phone]  Primary Care Physician:  Cletis Athens, MD   Pre-Procedure History & Physical: HPI:  Kristen Hudson is a 57 y.o. female is here for an colonoscopy.   Past Medical History:  Diagnosis Date   Acid reflux    Seizures ([place])     Past Surgical History:  Procedure Laterality Date   APPENDECTOMY     CESAREAN SECTION     CHOLECYSTECTOMY     COLON RESECTION     OVARY SURGERY      Prior to Admission medications   Medication Sig Start Date End Date Taking? Authorizing Provider  levETIRAcetam (KEPPRA) 750 MG tablet Take 1 tablet (750 mg total) by mouth 2 (two) times daily. 10/19/22   Cletis Athens, MD    Allergies as of 01/06/2023 - Review Complete 12/23/2022  Allergen Reaction Noted   Penicillins Anaphylaxis 07/11/2018    Family History  Problem Relation Age of Onset   Dementia Mother    Breast cancer Mother 70   Colon cancer Father    Diabetes Father    Lung cancer Father    Breast cancer Sister 64   Ovarian cancer Neg Hx     Social History   Socioeconomic History   Marital status: Single    Spouse name: Not on file   Number of children: Not on file   Years of education: Not on file   Highest education level: Not on file  Occupational History   Not on file  Tobacco Use   Smoking status: Every Day    Packs/day: 0.25    Types: Cigarettes   Smokeless tobacco: Never  Vaping Use   Vaping Use: Never used  Substance and Sexual Activity   Alcohol use: Not Currently   Drug use: Not Currently   Sexual activity: Not Currently  Other Topics Concern   Not on file  Social History Narrative   Are you right handed or left handed? Left Handed    Are you currently employed ? No    What is your current occupation? No   Do you live at home alone? yes   Who lives with you?    What type of home do you live in:  1 story or 2 story? Lives in an apartment        Social Determinants of Health   Financial Resource Strain: Not on file  Food Insecurity: Not on file  Transportation Needs: No Transportation Needs (11/27/2021)   PRAPARE - Hydrologist (Medical): No    Lack of Transportation (Non-Medical): No  Physical Activity: Inactive (11/27/2021)   Exercise Vital Sign    Days of Exercise per Week: 0 days    Minutes of Exercise per Session: 0 min  Stress: Not on file  Social Connections: Socially Isolated (11/27/2021)   Social Connection and Isolation Panel [NHANES]    Frequency of Communication with Friends and Family: More than three times a week    Frequency of Social Gatherings with Friends and Family: More than three times a week    Attends Religious Services: Never    Marine scientist or Organizations: No    Attends Archivist Meetings: Never    Marital Status: Divorced  Human resources officer Violence: Not At Risk (11/27/2021)   Humiliation, Afraid, Rape, and Kick questionnaire  Fear of Current or Ex-Partner: No    Emotionally Abused: No    Physically Abused: No    Sexually Abused: No    Review of Systems: See HPI, otherwise negative ROS  Physical Exam: Ht '5\' 3"'$  (1.6 m)   Wt 89.4 kg   BMI 34.90 kg/m  General:   Alert,  pleasant and cooperative in NAD Head:  Normocephalic and atraumatic. Neck:  Supple; no masses or thyromegaly. Lungs:  Clear throughout to auscultation, normal respiratory effort.    Heart:  +S1, +S2, Regular rate and rhythm, No edema. Abdomen:  Soft, nontender and nondistended. Normal bowel sounds, without guarding, and without rebound.   Neurologic:  Alert and  oriented x4;  grossly normal neurologically.  Impression/Plan: [name] is here for an colonoscopy to be performed for Screening colonoscopy average risk   Risks, benefits, limitations, and alternatives regarding  colonoscopy have been reviewed with the patient.   Questions have been answered.  All parties agreeable.   Jonathon Bellows, MD  01/20/2023, 8:53 AM

## 2023-01-21 ENCOUNTER — Encounter: Payer: Self-pay | Admitting: Gastroenterology

## 2023-01-21 LAB — SURGICAL PATHOLOGY

## 2023-01-23 ENCOUNTER — Other Ambulatory Visit: Payer: Self-pay | Admitting: Internal Medicine

## 2023-02-08 ENCOUNTER — Ambulatory Visit: Payer: 59 | Admitting: Urology

## 2023-02-09 ENCOUNTER — Encounter: Payer: Self-pay | Admitting: Urology

## 2023-03-01 ENCOUNTER — Ambulatory Visit: Payer: 59 | Admitting: Urology

## 2023-03-08 ENCOUNTER — Ambulatory Visit: Payer: 59 | Admitting: Urology

## 2023-04-26 ENCOUNTER — Encounter: Payer: Self-pay | Admitting: Urology

## 2023-04-26 ENCOUNTER — Ambulatory Visit (INDEPENDENT_AMBULATORY_CARE_PROVIDER_SITE_OTHER): Payer: 59 | Admitting: Urology

## 2023-04-26 VITALS — BP 117/85 | HR 90 | Ht 63.0 in | Wt 197.0 lb

## 2023-04-26 DIAGNOSIS — N3020 Other chronic cystitis without hematuria: Secondary | ICD-10-CM

## 2023-04-26 DIAGNOSIS — R102 Pelvic and perineal pain: Secondary | ICD-10-CM

## 2023-04-26 DIAGNOSIS — N3010 Interstitial cystitis (chronic) without hematuria: Secondary | ICD-10-CM | POA: Diagnosis not present

## 2023-04-26 LAB — URINALYSIS, COMPLETE
Bilirubin, UA: NEGATIVE
Glucose, UA: NEGATIVE
Ketones, UA: NEGATIVE
Nitrite, UA: NEGATIVE
Protein,UA: NEGATIVE
RBC, UA: NEGATIVE
Specific Gravity, UA: <1.005 — ABNORMAL LOW (ref 1.005–1.030)
Urobilinogen, Ur: 0.2 mg/dL (ref 0.2–1.0)
pH, UA: 5.5 (ref 5.0–7.5)

## 2023-04-26 LAB — MICROSCOPIC EXAMINATION: RBC, Urine: NONE SEEN /hpf (ref 0–2)

## 2023-04-26 MED ORDER — OXYBUTYNIN CHLORIDE ER 10 MG PO TB24: 10.0000 mg | ORAL_TABLET | Freq: Every day | ORAL | 11 refills | Status: AC

## 2023-04-26 NOTE — Addendum Note (Signed)
Addended by: Sueanne Margarita on: 04/26/2023 10:52 AM   Modules accepted: Orders

## 2023-04-26 NOTE — Patient Instructions (Signed)

## 2023-04-26 NOTE — Progress Notes (Signed)
04/26/2023 10:16 AM   Kristen Hudson Sep 03, 1966 [id number]  Referring provider: Corky Downs, MD [address] [place],  [place] [address]  Chief Complaint  Patient presents with   Cystitis    Pelvic pain    HPI: I was consulted to assist the patient's suprapubic discomfort present for 2 years.  Is not daily.  It can come and go.  Sometimes it is relieved by voiding.  She describes seeing a gynecologist in [place] and was told not to have a hysterectomy  She can void every 10 minutes.  She cannot hold it for an hour.  She voids frequently due to fullness and urgency not pain.  She gets up 3 times at night.  Often she will have urgency and only void a small amount.  During the day her flow is more reasonable and she does feel empty.  She does stop and start  She had a TIA long time ago.  She saw Dr. Shawnie P[place] who thought that her pain might be due to her uterus with endometriosis or fibroids but she had had 8 abdominal surgeries and surgical risks were high.  She did not recommend a hysterectomy but want to have her assessed for possible interstitial cystitis.  I do not see a recent CT scan or pelvic ultrasound  No history of kidney stones bladder surgery or bladder infections.  No neurologic issues.  Bowel movements normal   PMH: Past Medical History:  Diagnosis Date   Acid reflux    Seizures ([facility])     Surgical History: Past Surgical History:  Procedure Laterality Date   APPENDECTOMY     CESAREAN SECTION     CHOLECYSTECTOMY     COLON RESECTION     COLONOSCOPY WITH PROPOFOL N/A 01/20/2023   Procedure: COLONOSCOPY WITH PROPOFOL;  Surgeon: Wyline Mood, MD;  Location: [facility] ENDOSCOPY;  Service: Gastroenterology;  Laterality: N/A;   OVARY SURGERY      Home Medications:  Allergies as of 04/26/2023       Reactions   Penicillins Anaphylaxis   Did it involve swelling of the face/tongue/throat, SOB, or low BP? No Did it involve sudden or severe rash/hives, skin  peeling, or any reaction on the inside of your mouth or nose? No Did you need to seek medical attention at a hospital or doctor's office? No When did it last happen?     teenager  If all above answers are "NO", may proceed with cephalosporin use.        Medication List        Accurate as of April 26, 2023 10:16 AM. If you have any questions, ask your nurse or doctor.          gabapentin 300 MG capsule Commonly known as: NEURONTIN Take by mouth.   levETIRAcetam 750 MG tablet Commonly known as: Keppra Take 1 tablet (750 mg total) by mouth 2 (two) times daily.   meloxicam 15 MG tablet Commonly known as: MOBIC Take 15 mg by mouth daily.   traZODone 50 MG tablet Commonly known as: DESYREL Take 50 mg by mouth at bedtime.        Allergies:  Allergies  Allergen Reactions   Penicillins Anaphylaxis    Did it involve swelling of the face/tongue/throat, SOB, or low BP? No Did it involve sudden or severe rash/hives, skin peeling, or any reaction on the inside of your mouth or nose? No Did you need to seek medical attention at a hospital or doctor's office? No  When did it last happen?     teenager  If all above answers are "NO", may proceed with cephalosporin use.     Family History: Family History  Problem Relation Age of Onset   Dementia Mother    Breast cancer Mother 26   Colon cancer Father    Diabetes Father    Lung cancer Father    Breast cancer Sister 69   Ovarian cancer Neg Hx     Social History:  reports that she has been smoking cigarettes. She has been smoking an average of .25 packs per day. She has been exposed to tobacco smoke. She has never used smokeless tobacco. She reports that she does not currently use alcohol. She reports that she does not currently use drugs.  ROS:                                        Physical Exam: BP 117/85   Pulse 90   Ht 5\' 3"  (1.6 m)   Wt 89.4 kg   BMI 34.90 kg/m   Constitutional:  Alert  and oriented, No acute distress. HEENT: [place] AT, moist mucus membranes.  Trachea midline, no masses. Cardiovascular: No clubbing, cyanosis, or edema. Respiratory: Normal respiratory effort, no increased work of breathing. GI: Abdomen is soft, nontender, nondistended, no abdominal masses GU: Patient did have a tighter introitus but I was able to examine her quite well.  At first she was tender but then she was not.  Well supported bladder neck.  No stress incontinence or prolapse Skin: No rashes, bruises or suspicious lesions. Lymph: No cervical or inguinal adenopathy. Neurologic: Grossly intact, no focal deficits, moving all 4 extremities. Psychiatric: Normal mood and affect.  Laboratory Data: Lab Results  Component Value Date   WBC 8.6 12/05/2021   HGB 14.6 12/05/2021   HCT 43.0 12/05/2021   MCV 89.4 12/05/2021   PLT 318 12/05/2021    Lab Results  Component Value Date   CREATININE 1.02 12/05/2021    No results found for: "PSA"  No results found for: "TESTOSTERONE"  No results found for: "HGBA1C"  Urinalysis    Component Value Date/Time   COLORURINE STRAW (A) 11/11/2020 1138   APPEARANCEUR CLEAR (A) 11/11/2020 1138   LABSPEC 1.003 (L) 11/11/2020 1138   PHURINE 7.0 11/11/2020 1138   GLUCOSEU NEGATIVE 11/11/2020 1138   HGBUR NEGATIVE 11/11/2020 1138   BILIRUBINUR neg 10/06/2022 1137   KETONESUR NEGATIVE 11/11/2020 1138   PROTEINUR Negative 10/06/2022 1137   PROTEINUR NEGATIVE 11/11/2020 1138   UROBILINOGEN negative (A) 10/06/2022 1137   NITRITE neg 10/06/2022 1137   NITRITE NEGATIVE 11/11/2020 1138   LEUKOCYTESUR Negative 10/06/2022 1137   LEUKOCYTESUR TRACE (A) 11/11/2020 1138    Pertinent Imaging: Urine reviewed.  Urine sent for culture.  Chart reviewed.  Assessment & Plan: Patient has ill-defined abdominal pain.  She has urgency and frequency.  Some of the details were more challenging.  I thought I would put her on oxybutynin ER 10 mg 3 x 11 due to lack of  samples, get a CT scan for safety reasons and have her come back for cystoscopy.  She may need a hydrodistention in the future.  We will proceed accordingly  1. Interstitial cystitis  - Urinalysis, Complete   No follow-ups on file.  Martina Sinner, MD  [facility] [address], [address] [place], [place] [address] [phone]  [phone]  

## 2023-05-10 ENCOUNTER — Ambulatory Visit
Admission: RE | Admit: 2023-05-10 | Discharge: 2023-05-10 | Disposition: A | Discharge status: Home / Self Care | Payer: 59 | Source: Ambulatory Visit | Attending: Urology | Admitting: Urology

## 2023-05-10 DIAGNOSIS — N3010 Interstitial cystitis (chronic) without hematuria: Secondary | ICD-10-CM | POA: Diagnosis not present

## 2023-05-10 DIAGNOSIS — R109 Unspecified abdominal pain: Secondary | ICD-10-CM | POA: Diagnosis not present

## 2023-05-10 MED ORDER — IOHEXOL 300 MG/ML  SOLN
100.0000 mL | Freq: Once | INTRAMUSCULAR | Status: AC | PRN
  Administered 2023-05-10: 100 mL via INTRAVENOUS

## 2023-06-15 ENCOUNTER — Ambulatory Visit: Payer: 59 | Admitting: Neurology

## 2023-06-15 ENCOUNTER — Encounter: Payer: Self-pay | Admitting: Neurology

## 2023-06-28 ENCOUNTER — Other Ambulatory Visit: Payer: 59 | Admitting: Urology

## 2023-06-29 ENCOUNTER — Encounter: Payer: Self-pay | Admitting: Urology

## 2023-09-27 ENCOUNTER — Emergency Department
Admission: EM | Admit: 2023-09-27 | Discharge: 2023-09-27 | Disposition: A | Discharge status: Home / Self Care | Payer: 59 | Attending: Emergency Medicine | Admitting: Emergency Medicine

## 2023-09-27 ENCOUNTER — Emergency Department: Payer: 59

## 2023-09-27 DIAGNOSIS — S299XXA Unspecified injury of thorax, initial encounter: Secondary | ICD-10-CM | POA: Diagnosis present

## 2023-09-27 DIAGNOSIS — W182XXA Fall in (into) shower or empty bathtub, initial encounter: Secondary | ICD-10-CM | POA: Diagnosis not present

## 2023-09-27 DIAGNOSIS — Y92002 Bathroom of unspecified non-institutional (private) residence single-family (private) house as the place of occurrence of the external cause: Secondary | ICD-10-CM | POA: Diagnosis not present

## 2023-09-27 DIAGNOSIS — S20211A Contusion of right front wall of thorax, initial encounter: Secondary | ICD-10-CM | POA: Diagnosis not present

## 2023-09-27 DIAGNOSIS — Z043 Encounter for examination and observation following other accident: Secondary | ICD-10-CM | POA: Diagnosis not present

## 2023-09-27 DIAGNOSIS — W19XXXA Unspecified fall, initial encounter: Secondary | ICD-10-CM

## 2023-09-27 MED ORDER — LIDOCAINE 5 % EX PTCH
1.0000 | MEDICATED_PATCH | Freq: Once | CUTANEOUS | Status: DC
  Administered 2023-09-27: 1 via TRANSDERMAL
  Filled 2023-09-27: qty 1

## 2023-09-27 MED ORDER — HYDROCODONE-ACETAMINOPHEN 5-325 MG PO TABS
1.0000 | ORAL_TABLET | Freq: Once | ORAL | Status: AC
  Administered 2023-09-27: 1 via ORAL
  Filled 2023-09-27: qty 1

## 2023-09-27 MED ORDER — LIDOCAINE 5 % EX PTCH: 1.0000 | MEDICATED_PATCH | Freq: Two times a day (BID) | CUTANEOUS | 0 refills | Status: AC | PRN

## 2023-09-27 MED ORDER — IBUPROFEN 800 MG PO TABS: 800.0000 mg | ORAL_TABLET | Freq: Three times a day (TID) | ORAL | 0 refills | Status: DC | PRN

## 2023-09-27 MED ORDER — HYDROCODONE-ACETAMINOPHEN 5-325 MG PO TABS: 1.0000 | ORAL_TABLET | Freq: Three times a day (TID) | ORAL | 0 refills | Status: AC | PRN

## 2023-09-27 NOTE — Discharge Instructions (Signed)
Your exam and XR are negative for a rib fracture or lung injury. You will experience chest wall pain and disability. Take the prescription meds as directed. Apply warm and/or cool compresses to reduce pain. Follow-up with your primary provider for ongoing symptoms.

## 2023-09-27 NOTE — ED Notes (Addendum)
..  The patient is A&OX4, ambulatory at d/c, but wheeled out of ED via wheelchair. NAD. Pt verbalized understanding of d/c instructions, prescription and follow up care. Pt reports her daughter who lives with her will be driving her home.

## 2023-09-27 NOTE — ED Triage Notes (Signed)
Pt presents to the ED POV from home. Pt states that she was dropped off by daughter. Pt states that she was at home and slipped and fell into the bathtub. Pt states that the right side of her rib cage hit the tub. Pt denies LOC or hitting her head. Breath sounds auscultated bilaterally.

## 2023-09-28 NOTE — ED Provider Notes (Signed)
[facility] Emergency Department Provider Note     Event Date/Time   First MD Initiated Contact with Patient 09/27/23 2037     (approximate)   History   Fall   HPI  Kristen Hudson is a 57 y.o. female with a noncontributory medical history, presents to the ED from home for evaluation of injury sustained following mechanical fall.  Patient reports she slipped at home falling hitting the right side of her chest wall on the bathtub.  She denies any head injury or LOC.  Her primary complaint is right-sided chest pain in the ribs.  No nausea, vomiting, diarrhea reported.  Physical Exam   Triage Vital Signs: ED Triage Vitals [09/27/23 1836]  Encounter Vitals Group     BP 122/80     Systolic BP Percentile      Diastolic BP Percentile      Pulse Rate 73     Resp 18     Temp 98.4 F (36.9 C)     Temp Source Oral     SpO2 97 %     Weight 206 lb (93.4 kg)     Height 5\' 3"  (1.6 m)     Head Circumference      Peak Flow      Pain Score 10     Pain Loc      Pain Education      Exclude from Growth Chart     Most recent vital signs: Vitals:   09/27/23 1836 09/27/23 2138  BP: 122/80 131/82  Pulse: 73 65  Resp: 18 17  Temp: 98.4 F (36.9 C)   SpO2: 97% 97%    General Awake, no distress. NAD HEENT NCAT. PERRL. EOMI. No rhinorrhea. Mucous membranes are moist. CV:  Good peripheral perfusion. RRR RESP:  Normal effort. CTA ABD:  No distention.  MSK:  Normal spinal alignment without midline tenderness, spasm, deformity, or step-off.   ED Results / Procedures / Treatments   Labs (all labs ordered are listed, but only abnormal results are displayed) Labs Reviewed - No data to display   EKG  RADIOLOGY  I personally viewed and evaluated these images as part of my medical decision making, as well as reviewing the written report by the radiologist.  ED Provider Interpretation: No acute rib fracture.  No pneumothorax noted.  DG Chest 2  View  Result Date: 09/27/2023 CLINICAL DATA:  Fall EXAM: CHEST - 2 VIEW COMPARISON:  07/11/2018 FINDINGS: The heart size and mediastinal contours are within normal limits. Both lungs are clear. The visualized skeletal structures are unremarkable. IMPRESSION: No active cardiopulmonary disease. Electronically Signed   By: Charlett Nose M.D.   On: 09/27/2023 21:22     PROCEDURES:  Critical Care performed: No  Procedures   MEDICATIONS ORDERED IN ED: Medications  lidocaine (LIDODERM) 5 % 1 patch (1 patch Transdermal Patch Applied 09/27/23 2137)  HYDROcodone-acetaminophen (NORCO/VICODIN) 5-325 MG per tablet 1 tablet (1 tablet Oral Given 09/27/23 2138)     IMPRESSION / MDM / ASSESSMENT AND PLAN / ED COURSE  I reviewed the triage vital signs and the nursing notes.                              Differential diagnosis includes, but is not limited to, chest wall contusion, rib fracture, pneumothorax, myalgias  Patient's presentation is most consistent with acute complicated illness / injury requiring diagnostic workup.  Patient's diagnosis is  consistent with mechanical fall resulting in chest wall contusion without radiologic evidence of any acute fracture or pneumothorax.  My interpretation of images did not reveal any acute findings. Patient will be discharged home with prescriptions for hydrocodone, ibuprofen, Lidoderm patches. Patient is to follow up with her primary provider as discussed, as needed or otherwise directed. Patient is given ED precautions to return to the ED for any worsening or new symptoms.  FINAL CLINICAL IMPRESSION(S) / ED DIAGNOSES   Final diagnoses:  Fall in home, initial encounter  Chest wall contusion, right, initial encounter     Rx / DC Orders   ED Discharge Orders          Ordered    lidocaine (LIDODERM) 5 %  Every 12 hours PRN        09/27/23 2129    HYDROcodone-acetaminophen (NORCO) 5-325 MG tablet  3 times daily PRN        09/27/23 2129    ibuprofen  (ADVIL) 800 MG tablet  Every 8 hours PRN        09/27/23 2129             Note:  This document was prepared using Dragon voice recognition software and may include unintentional dictation errors.    Lissa Hoard, PA-C 09/28/23 1610    Dionne Bucy, MD 09/29/23 [phone]

## 2023-10-18 DIAGNOSIS — M160 Bilateral primary osteoarthritis of hip: Secondary | ICD-10-CM | POA: Diagnosis not present

## 2023-10-18 DIAGNOSIS — M25552 Pain in left hip: Secondary | ICD-10-CM | POA: Diagnosis not present

## 2023-11-07 ENCOUNTER — Emergency Department: Payer: 59

## 2023-11-07 ENCOUNTER — Emergency Department
Admission: EM | Admit: 2023-11-07 | Discharge: 2023-11-07 | Disposition: A | Discharge status: Home / Self Care | Payer: 59 | Attending: Emergency Medicine | Admitting: Emergency Medicine

## 2023-11-07 ENCOUNTER — Encounter: Payer: Self-pay | Admitting: Emergency Medicine

## 2023-11-07 ENCOUNTER — Other Ambulatory Visit: Payer: Self-pay

## 2023-11-07 DIAGNOSIS — M1712 Unilateral primary osteoarthritis, left knee: Secondary | ICD-10-CM | POA: Diagnosis not present

## 2023-11-07 DIAGNOSIS — M79605 Pain in left leg: Secondary | ICD-10-CM | POA: Diagnosis not present

## 2023-11-07 DIAGNOSIS — M160 Bilateral primary osteoarthritis of hip: Secondary | ICD-10-CM | POA: Diagnosis not present

## 2023-11-07 DIAGNOSIS — M1612 Unilateral primary osteoarthritis, left hip: Secondary | ICD-10-CM | POA: Diagnosis not present

## 2023-11-07 DIAGNOSIS — M25562 Pain in left knee: Secondary | ICD-10-CM | POA: Diagnosis not present

## 2023-11-07 DIAGNOSIS — M7989 Other specified soft tissue disorders: Secondary | ICD-10-CM | POA: Diagnosis not present

## 2023-11-07 DIAGNOSIS — M25552 Pain in left hip: Secondary | ICD-10-CM | POA: Diagnosis present

## 2023-11-07 MED ORDER — OXYCODONE-ACETAMINOPHEN 5-325 MG PO TABS
1.0000 | ORAL_TABLET | Freq: Once | ORAL | Status: AC
  Administered 2023-11-07: 1 via ORAL
  Filled 2023-11-07: qty 1

## 2023-11-07 MED ORDER — KETOROLAC TROMETHAMINE 60 MG/2ML IM SOLN
30.0000 mg | Freq: Once | INTRAMUSCULAR | Status: AC
  Administered 2023-11-07: 30 mg via INTRAMUSCULAR
  Filled 2023-11-07: qty 2

## 2023-11-07 MED ORDER — METHOCARBAMOL 500 MG PO TABS
500.0000 mg | ORAL_TABLET | Freq: Once | ORAL | Status: AC
  Administered 2023-11-07: 500 mg via ORAL
  Filled 2023-11-07: qty 1

## 2023-11-07 NOTE — ED Notes (Signed)
Assisted to restroom at this time by daughter.

## 2023-11-07 NOTE — Discharge Instructions (Signed)
Please follow-up with orthopedics in the near future for ongoing management of your severe arthritis of your hips and your left knee.

## 2023-11-07 NOTE — ED Triage Notes (Signed)
Pt reports pain to her left leg for awhile that is worsening. Pt reports was told she needed surgery years ago but did not want surgery. Pt reports pain has continued to get worse and worse. Pt reports has been using arthritis cream but it is not as effective.

## 2023-11-07 NOTE — ED Notes (Signed)
Pt verbalizes understanding of discharge instructions. Opportunity for questioning and answers were provided. Pt discharged from ED to home with family.    

## 2023-11-07 NOTE — ED Provider Notes (Signed)
[facility] Provider Note    Event Date/Time   First MD Initiated Contact with Patient 11/07/23 1103     (approximate)   History   Leg Pain   HPI Kristen Hudson is a 57 y.o. female presenting today for left leg pain.  Patient states pain at her left hip and left knee that has been ongoing and worsening over the past 2 weeks.  Notes difficulty walking.  Denies any direct trauma or falls that she is aware of.  Feels she has noted swelling to her left leg more so than her right leg.  Otherwise denies fevers, back pain, numbness, redness to the leg.  Patient reports prior history of severe osteoarthritis in her left knee and was told by her PCP that this is likely arthritis symptoms.  Has not had follow-up with orthopedics.     Physical Exam   Triage Vital Signs: ED Triage Vitals  Encounter Vitals Group     BP 11/07/23 1046 130/85     Systolic BP Percentile --      Diastolic BP Percentile --      Pulse Rate 11/07/23 1046 88     Resp 11/07/23 1046 18     Temp 11/07/23 1046 97.7 F (36.5 C)     Temp Source 11/07/23 1046 Oral     SpO2 11/07/23 1046 98 %     Weight 11/07/23 1032 205 lb 0.4 oz (93 kg)     Height 11/07/23 1032 5\' 3"  (1.6 m)     Head Circumference --      Peak Flow --      Pain Score 11/07/23 1031 10     Pain Loc --      Pain Education --      Exclude from Growth Chart --     Most recent vital signs: Vitals:   11/07/23 1100 11/07/23 1330  BP: 100/73 (!) 123/94  Pulse: 84 (!) 57  Resp: 17 18  Temp:    SpO2: 98% 98%   I have reviewed the vital signs. General:  Awake, alert, no acute distress. Head:  Normocephalic, Atraumatic. EENT:  PERRL, EOMI, Oral mucosa pink and moist, Neck is supple. Cardiovascular: Regular rate, 2+ distal pulses. Respiratory:  Normal respiratory effort, symmetrical expansion, no distress.   Extremities: Tenderness palpation noted throughout left lower extremity without any obvious deformity, swelling, or  erythema. Neuro:  Alert and oriented.  Interacting appropriately.  Neuro intact throughout left lower extremity Skin:  Warm, dry, no rash.   Psych: Appropriate affect.    ED Results / Procedures / Treatments   Labs (all labs ordered are listed, but only abnormal results are displayed) Labs Reviewed - No data to display   EKG    RADIOLOGY Independently interpreted x-rays showing evidence of severe osteoarthritis of bilateral hips and left knee.  Independently evaluated ultrasound with no acute DVT   PROCEDURES:  Critical Care performed: No  Procedures   MEDICATIONS ORDERED IN ED: Medications  ketorolac (TORADOL) injection 30 mg (30 mg Intramuscular Given 11/07/23 1125)  oxyCODONE-acetaminophen (PERCOCET/ROXICET) 5-325 MG per tablet 1 tablet (1 tablet Oral Given 11/07/23 1125)  methocarbamol (ROBAXIN) tablet 500 mg (500 mg Oral Given 11/07/23 1125)     IMPRESSION / MDM / ASSESSMENT AND PLAN / ED COURSE  I reviewed the triage vital signs and the nursing notes.  Differential diagnosis includes, but is not limited to, osteoarthritis, DVT  Patient's presentation is most consistent with acute complicated illness / injury requiring diagnostic workup.  Patient is a 57 year old female presenting today for pain and swelling throughout her left lower extremity worsening over the past 2 weeks.  Primary concern is for osteoarthritis but given her concerns with swelling and tenderness throughout, will evaluate for DVT.  Vital signs otherwise stable.  X-rays show severe osteoarthritis of bilateral hips as well as left knee.  Venous duplex negative for DVT of the left lower extremity.  Suspect all her symptoms at this time are related to severe osteoarthritis.  Pain symptoms improved with medications here in the ED.  Was given referral for follow-up with orthopedics for chronic management.  Patient given strict return precautions.  Clinical Course as of  11/07/23 1419  Sun Nov 07, 2023  1313 X-rays showing severe osteoarthritis of both the hip and the left knee [DW]    Clinical Course User Index [DW] Janith Lima, MD     FINAL CLINICAL IMPRESSION(S) / ED DIAGNOSES   Final diagnoses:  Primary osteoarthritis of both hips  Primary osteoarthritis of left knee     Rx / DC Orders   ED Discharge Orders          Ordered    AMB referral to orthopedics       Comments: Severe osteoarthritis bilateral hips and left knee   11/07/23 1419             Note:  This document was prepared using Dragon voice recognition software and may include unintentional dictation errors.   Janith Lima, MD 11/07/23 1420

## 2023-11-07 NOTE — ED Notes (Signed)
Pt very uncomfortable while moving from wheelchair to bed.

## 2023-11-16 DIAGNOSIS — M79605 Pain in left leg: Secondary | ICD-10-CM | POA: Diagnosis not present

## 2023-11-16 DIAGNOSIS — M1712 Unilateral primary osteoarthritis, left knee: Secondary | ICD-10-CM | POA: Diagnosis not present

## 2023-11-16 DIAGNOSIS — G8929 Other chronic pain: Secondary | ICD-10-CM | POA: Diagnosis not present

## 2023-11-16 DIAGNOSIS — M25552 Pain in left hip: Secondary | ICD-10-CM | POA: Diagnosis not present

## 2023-11-18 ENCOUNTER — Telehealth: Payer: Self-pay | Admitting: Neurology

## 2023-11-18 NOTE — Telephone Encounter (Signed)
Clearance for surgery from [facility] was put in [employer]

## 2023-11-19 NOTE — Telephone Encounter (Signed)
She has not been seen in a year, I cannot fill out form without seeing her. Thanks

## 2023-11-22 ENCOUNTER — Other Ambulatory Visit: Payer: Self-pay | Admitting: Student

## 2023-11-22 DIAGNOSIS — M79605 Pain in left leg: Secondary | ICD-10-CM

## 2023-11-22 DIAGNOSIS — M25552 Pain in left hip: Secondary | ICD-10-CM

## 2023-11-22 DIAGNOSIS — G8929 Other chronic pain: Secondary | ICD-10-CM

## 2023-11-23 ENCOUNTER — Ambulatory Visit
Admission: RE | Admit: 2023-11-23 | Discharge: 2023-11-23 | Disposition: A | Discharge status: Home / Self Care | Payer: 59 | Source: Ambulatory Visit | Attending: Student | Admitting: Student

## 2023-11-23 DIAGNOSIS — M25552 Pain in left hip: Secondary | ICD-10-CM | POA: Insufficient documentation

## 2023-11-23 DIAGNOSIS — G8929 Other chronic pain: Secondary | ICD-10-CM | POA: Insufficient documentation

## 2023-11-23 DIAGNOSIS — M160 Bilateral primary osteoarthritis of hip: Secondary | ICD-10-CM | POA: Diagnosis not present

## 2023-11-23 DIAGNOSIS — M79605 Pain in left leg: Secondary | ICD-10-CM | POA: Diagnosis not present

## 2023-11-23 DIAGNOSIS — S73192A Other sprain of left hip, initial encounter: Secondary | ICD-10-CM | POA: Diagnosis not present

## 2023-12-01 DIAGNOSIS — F17200 Nicotine dependence, unspecified, uncomplicated: Secondary | ICD-10-CM | POA: Diagnosis not present

## 2023-12-01 DIAGNOSIS — Z23 Encounter for immunization: Secondary | ICD-10-CM | POA: Diagnosis not present

## 2023-12-01 DIAGNOSIS — Z8669 Personal history of other diseases of the nervous system and sense organs: Secondary | ICD-10-CM | POA: Diagnosis not present

## 2023-12-01 DIAGNOSIS — Z13220 Encounter for screening for lipoid disorders: Secondary | ICD-10-CM | POA: Diagnosis not present

## 2023-12-01 DIAGNOSIS — Z131 Encounter for screening for diabetes mellitus: Secondary | ICD-10-CM | POA: Diagnosis not present

## 2023-12-01 DIAGNOSIS — Z1159 Encounter for screening for other viral diseases: Secondary | ICD-10-CM | POA: Diagnosis not present

## 2023-12-01 DIAGNOSIS — Z1389 Encounter for screening for other disorder: Secondary | ICD-10-CM | POA: Diagnosis not present

## 2023-12-01 DIAGNOSIS — M1990 Unspecified osteoarthritis, unspecified site: Secondary | ICD-10-CM | POA: Diagnosis not present

## 2023-12-07 ENCOUNTER — Telehealth (INDEPENDENT_AMBULATORY_CARE_PROVIDER_SITE_OTHER): Payer: 59 | Admitting: Neurology

## 2023-12-07 DIAGNOSIS — Z91199 Patient's noncompliance with other medical treatment and regimen due to unspecified reason: Secondary | ICD-10-CM

## 2023-12-07 NOTE — Progress Notes (Signed)
Patient was called 5 times with no answer, voicemail left each time. Will need to reschedule appointment.

## 2023-12-30 ENCOUNTER — Encounter: Payer: Self-pay | Admitting: Neurology

## 2023-12-30 ENCOUNTER — Telehealth: Payer: 59 | Admitting: Neurology

## 2023-12-30 DIAGNOSIS — Z87898 Personal history of other specified conditions: Secondary | ICD-10-CM

## 2023-12-30 DIAGNOSIS — Q078 Other specified congenital malformations of nervous system: Secondary | ICD-10-CM

## 2023-12-30 MED ORDER — LEVETIRACETAM 750 MG PO TABS: 750.0000 mg | ORAL_TABLET | Freq: Two times a day (BID) | ORAL | 3 refills | Status: AC

## 2023-12-30 NOTE — Progress Notes (Signed)
 Virtual Visit via Video Note The purpose of this virtual visit is to provide medical care while limiting exposure to the novel coronavirus.    Consent was obtained for video visit:  Yes.   Answered questions that patient had about telehealth interaction:  Yes.    Pt location: Home Physician Location: office Name of referring provider:  Britta King, MD I connected with Alvin Senters at patients initiation/request on 12/30/2023 at  8:30 AM EST by video enabled telemedicine application and verified that I am speaking with the correct person using two identifiers. Pt MRN:  [id number] Pt DOB:  01-10-1966 Video Participants:  Alvin Senters   History of Present Illness:  The patient had a virtual video visit on 12/30/2023. She was last seen in the neurology clinic in November 2023. 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 Records and images available were reviewed. 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 Despite captured PNES on EEG, she was continued on Levetiracetam  750mg  BID due to abnormal MRI. I personally reviewed brain MRI with and without contrast done 12/2022 which did not report the heterotopia, impression was stable mild multifocal white matter hyperintensity that may be seen in setting of chronic microangiopathy. On my review, similar changes are seen in the right lateral ventricle, unchanged from 2020 imaging. Her wake and sleep EEG in 11/2022 was normal.   She denies any seizures since 58/2023. No episodes of left-sided shaking previously reported. She states that she has not been told of any staring spells recently. She states she would rock, but denies any loss of awareness with them, I don't rock all the time. She denies any olfactory/gustatory hallucinations, focal numbness/tingling. No headaches, dizziness, no falls  since 08/2023 when she broke 2 ribs. She denies any side effects on Levetiracetam  750mg  BID, mood is okay. She gets 9-10 hours of sleep. She has been dealing with pain in her left leg, she states both the left knee and hip are bothering her, she is not sure which will get surgery first. We have a clearance form for left total knee arthroplasty.    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 Records were reviewed. She describes seizures as starting with left arm shaking, her lip would shake, then she loses consciousness and would not remember anything. No tongue bite or incontinence. Leita has witnessed the seizures and notes that she would stare off or alert family that she feels something coming on, sits, then her left arm then whole body shakes with eyes closed, head turned to the left side. It would last a few minutes and when she comes to, she repeatedly asks for water. She thinks she also has nocturnal seizures. She has seizures every few months, last seizure was at church in August. When she came back, she went back into it and had 4 so they called EMS. She denies any olfactory/gustatory hallucinations, deja vu, rising epigastric sensation, focal numbness/tingling/weakness, myoclonic jerks. She was previously admitted at [facility] in 2014 for dizziness. During her stay, she started having recurrent episodes of left-sided shaking. Neurology notes reviewed, she had 2 events captured with irregular shaking of head and left arm  lasting 3-5 minutes. She did not respond and kept her eyes closed. EEG did not show any epileptiform correlate. Baseline showed mild diffuse slowing, no epileptiform abnormalities. She continued to have recurrent episodes during her hospitalization with  irregular side to side head movements, side to side shaking movements of her left arm, and similar side to side shaking movements of both legs, eyes closed. She was admitted to inpatient psychiatry after with a diagnosis of depression with somatization. 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 Normal hippocampi, mild chronic microvascular disease. Due to abnormalities on brain MRI, she was continued on seizure medication, taking Levetiracetam  750mg  BID (dose increased in 08/2022 by PCP). She denies any side effects on medication and denies any seizures since increase in dose.   She denies any headaches, dizziness, diplopia, dysarthria/dysphagia, neck pain, bowel/bladder dysfunction. SHe has back pain. She reports poor sleep with a total of 3 hours of interrupted sleep. She feels tired all the time. Leita is concerned about her memory getting worse. Patient reports memory is not good. She lives alone. Leita takes her to pay bills and does not forget payments. A couple of months ago she left the stove on. She has not been driving since August and denies getting lost. She denies missing medications. She repeats herself a lot. Leita has noticed over the past year that she talks to herself and answers herself. She would ask yes/no questions, Leita answers, then she does the opposite thing. She does not remember simple things such as instructions where to sign a document. Leita reports she is very moody frequently, which is new in the past few months. She was previously taking over the counter mood pills for menopause which were not helping. Years ago she reports taking an antidepressant while in [place] but she had visual hallucinations (seeing faces) and stopped medication. She states sometimes I cry for no reason.   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 She was in special education classes and finished the 8th grade. There is no history of febrile convulsions, CNS infections such as meningitis/encephalitis, significant traumatic brain injury, neurosurgical procedures, or family history of seizures.  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 Normal hippocampi, mild chronic microvascular disease. I personally reviewed brain MRI with and without contrast done 12/2022 which did not report the heterotopia, impression was stable mild multifocal white matter hyperintensity that may be seen in setting of chronic microangiopathy. On my review, similar changes are seen in the right lateral ventricle, unchanged from 2020 imaging.   EEG: Her wake and sleep EEG in 11/2022 was normal.     Current Outpatient Medications on File Prior to Visit  Medication Sig Dispense Refill   gabapentin  (NEURONTIN ) 300 MG capsule Take by mouth.     ibuprofen  (ADVIL ) 800 MG tablet Take 1 tablet (800 mg total) by mouth every 8 (eight) hours as needed. 30 tablet 0   levETIRAcetam  (KEPPRA ) 750 MG tablet Take 1 tablet (750 mg total) by mouth 2 (two) times daily. 180 tablet 1   meloxicam  (MOBIC ) 15 MG tablet Take 15 mg by  mouth daily.     oxybutynin  (DITROPAN -XL) 10 MG 24 hr tablet Take 1 tablet (10 mg total) by mouth at bedtime. 30 tablet 11   traZODone  (DESYREL ) 50 MG tablet Take 50 mg by mouth at bedtime.     No current facility-administered medications on file prior to visit.     Observations/Objective:   GEN:  The patient appears stated age and is in NAD.  Neurological examination: Patient is awake, alert. No aphasia or dysarthria. Intact fluency and comprehension.  Cranial nerves: Extraocular movements intact. No facial asymmetry. Motor: moves all extremities symmetrically, at least anti-gravity x 4.    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 She does have risk factors for seizure with gray matter heterotopia along the right lateral ventricle, however she had psychogenic non-epileptic events with left-sided irregular shaking and eyes closed, which are still seizures were described on initial visit in 10/2022. Her EEG was normal. She denies any seizures since 07/2022 on Levetiracetam  750mg  BID, refills sent. She is hoping to proceed with left knee surgery. From the standpoint of seizures, there is no contraindication to medically necessary surgery. The patient should take all seizure medications on time, even if NPO for surgery. Due to transportation issues, she would like to continue Neurology follow-up closer to home. Referral will be sent to [facility].    Follow Up Instructions:   -I discussed the assessment and treatment plan with the patient. The patient was provided an opportunity to ask questions and all were answered. The patient agreed with the plan and demonstrated an understanding of the instructions.   The patient was advised to call back or seek an in-person evaluation if the symptoms worsen or if the condition fails to improve as anticipated.    Darice CHRISTELLA Shivers, MD

## 2023-12-30 NOTE — Patient Instructions (Signed)
 Good to see you. Wishing you the best for upcoming surgery. Continue Keppra  750mg  twice a day. Referral will be sent to [facility] for follow-up closer to home. Call for any changes.    Seizure Precautions: 1. If medication has been prescribed for you to prevent seizures, take it exactly as directed.  Do not stop taking the medicine without talking to your doctor first, even if you have not had a seizure in a long time.   2. Avoid activities in which a seizure would cause danger to yourself or to others.  Don't operate dangerous machinery, swim alone, or climb in high or dangerous places, such as on ladders, roofs, or girders.  Do not drive unless your doctor says you may.  3. If you have any warning that you may have a seizure, lay down in a safe place where you can't hurt yourself.    4.  No driving for 6 months from last seizure, as per [place]  state law.   Please refer to the following link on the [employer]ile[employer] of [employer]'s website for more information: http://www.epilepsyfoundation.org/answerplace/Social/driving/drivingu.cfm   5.  Maintain good sleep hygiene. Avoid alcohol.  6.  Contact your doctor if you have any problems that may be related to the medicine you are taking.  7.  Call 911 and bring the patient back to the ED if:        A.  The seizure lasts longer than 5 minutes.       B.  The patient doesn't awaken shortly after the seizure  C.  The patient has new problems such as difficulty seeing, speaking or moving  D.  The patient was injured during the seizure  E.  The patient has a temperature over 102 F (39C)  F.  The patient vomited and now is having trouble breathing

## 2024-01-29 DIAGNOSIS — M1712 Unilateral primary osteoarthritis, left knee: Secondary | ICD-10-CM

## 2024-01-29 HISTORY — DX: Unilateral primary osteoarthritis, left knee: M17.12

## 2024-02-07 DIAGNOSIS — M1712 Unilateral primary osteoarthritis, left knee: Secondary | ICD-10-CM | POA: Diagnosis not present

## 2024-02-07 DIAGNOSIS — M1612 Unilateral primary osteoarthritis, left hip: Secondary | ICD-10-CM | POA: Diagnosis not present

## 2024-02-15 ENCOUNTER — Other Ambulatory Visit: Payer: Self-pay | Admitting: Surgery

## 2024-02-16 ENCOUNTER — Encounter
Admission: RE | Admit: 2024-02-16 | Discharge: 2024-02-16 | Disposition: A | Discharge status: Home / Self Care | Payer: 59 | Source: Ambulatory Visit | Attending: Surgery | Admitting: Surgery

## 2024-02-16 ENCOUNTER — Other Ambulatory Visit: Payer: Self-pay

## 2024-02-16 DIAGNOSIS — Z01818 Encounter for other preprocedural examination: Secondary | ICD-10-CM | POA: Insufficient documentation

## 2024-02-16 DIAGNOSIS — Z01812 Encounter for preprocedural laboratory examination: Secondary | ICD-10-CM

## 2024-02-16 DIAGNOSIS — Z01810 Encounter for preprocedural cardiovascular examination: Secondary | ICD-10-CM | POA: Diagnosis not present

## 2024-02-16 HISTORY — DX: Depression, unspecified: F32.A

## 2024-02-16 HISTORY — DX: Obstructive sleep apnea (adult) (pediatric): G47.33

## 2024-02-16 HISTORY — DX: Fibromyalgia: M79.7

## 2024-02-16 HISTORY — DX: Stress incontinence (female) (male): N39.3

## 2024-02-16 HISTORY — DX: Polyp of colon: K63.5

## 2024-02-16 LAB — COMPREHENSIVE METABOLIC PANEL
ALT: 27 U/L (ref 0–44)
AST: 21 U/L (ref 15–41)
Albumin: 4 g/dL (ref 3.5–5.0)
Alkaline Phosphatase: 64 U/L (ref 38–126)
Anion gap: 11 (ref 5–15)
BUN: 22 mg/dL — ABNORMAL HIGH (ref 6–20)
CO2: 23 mmol/L (ref 22–32)
Calcium: 9 mg/dL (ref 8.9–10.3)
Chloride: 105 mmol/L (ref 98–111)
Creatinine, Ser: 1 mg/dL (ref 0.44–1.00)
GFR, Estimated: >60 mL/min (ref >60)
Glucose, Bld: 77 mg/dL (ref 70–99)
Potassium: 3.8 mmol/L (ref 3.5–5.1)
Sodium: 139 mmol/L (ref 135–145)
Total Bilirubin: 0.5 mg/dL (ref 0.0–1.2)
Total Protein: 7.1 g/dL (ref 6.5–8.1)

## 2024-02-16 LAB — URINALYSIS, ROUTINE W REFLEX MICROSCOPIC
Bacteria, UA: NONE SEEN
Bilirubin Urine: NEGATIVE
Glucose, UA: NEGATIVE mg/dL
Hgb urine dipstick: NEGATIVE
Ketones, ur: NEGATIVE mg/dL
Nitrite: NEGATIVE
Protein, ur: NEGATIVE mg/dL
RBC / HPF: 0 RBC/hpf (ref 0–5)
Specific Gravity, Urine: 1.01 (ref 1.005–1.030)
pH: 5 (ref 5.0–8.0)

## 2024-02-16 LAB — CBC WITH DIFFERENTIAL/PLATELET
Abs Immature Granulocytes: 0.05 10*3/uL (ref 0.00–0.07)
Basophils Absolute: 0.1 10*3/uL (ref 0.0–0.1)
Basophils Relative: 1 %
Eosinophils Absolute: 0.1 10*3/uL (ref 0.0–0.5)
Eosinophils Relative: 1 %
HCT: 40.5 % (ref 36.0–46.0)
Hemoglobin: 13.9 g/dL (ref 12.0–15.0)
Immature Granulocytes: 0 %
Lymphocytes Relative: 20 %
Lymphs Abs: 2.4 10*3/uL (ref 0.7–4.0)
MCH: 30.5 pg (ref 26.0–34.0)
MCHC: 34.3 g/dL (ref 30.0–36.0)
MCV: 89 fL (ref 80.0–100.0)
Monocytes Absolute: 0.8 10*3/uL (ref 0.1–1.0)
Monocytes Relative: 7 %
Neutro Abs: 8.5 10*3/uL — ABNORMAL HIGH (ref 1.7–7.7)
Neutrophils Relative %: 71 %
Platelets: 300 10*3/uL (ref 150–400)
RBC: 4.55 MIL/uL (ref 3.87–5.11)
RDW: 12.6 % (ref 11.5–15.5)
WBC: 11.9 10*3/uL — ABNORMAL HIGH (ref 4.0–10.5)
nRBC: 0 % (ref 0.0–0.2)

## 2024-02-16 LAB — SURGICAL PCR SCREEN
MRSA, PCR: NEGATIVE
Staphylococcus aureus: NEGATIVE

## 2024-02-16 NOTE — Patient Instructions (Addendum)
Your procedure is scheduled on: Tuesday, February 25 Report to the Registration Desk on the 1st floor of the [facility] To find out your arrival time, please call [phone] between 1PM - 3PM on: Monday, February 24 If your arrival time is 6:00 am, do not arrive before that time as the [facility] entrance doors do not open until 6:00 am.  REMEMBER: Instructions that are not followed completely may result in serious medical risk, up to and including death; or upon the discretion of your surgeon and anesthesiologist your surgery may need to be rescheduled.  Do not eat food after midnight the night before surgery.  No gum chewing or hard candies.  You may however, drink CLEAR liquids up to 2 hours before you are scheduled to arrive for your surgery. Do not drink anything within 2 hours of your scheduled arrival time.  Clear liquids include: - water  - apple juice without pulp - gatorade (not RED colors) - black coffee or tea (Do NOT add milk or creamers to the coffee or tea) Do NOT drink anything that is not on this list.  In addition, your doctor has ordered for you to drink the provided:  Ensure Pre-Surgery Clear Carbohydrate Drink  Drinking this carbohydrate drink up to two hours before surgery helps to reduce insulin resistance and improve patient outcomes. Please complete drinking 2 hours before scheduled arrival time.  One week prior to surgery: starting today February 19 Stop meloxicam and Anti-inflammatories (NSAIDS) such as Advil, Aleve, Ibuprofen, Motrin, Naproxen, Naprosyn and Aspirin based products such as Excedrin, Goody's Powder, BC Powder. Stop ANY OVER THE COUNTER supplements until after surgery.  You may however, continue to take Tylenol if needed for pain up until the day of surgery.  Continue taking all of your other prescription medications up until the day of surgery.  ON THE DAY OF SURGERY ONLY TAKE THESE MEDICATIONS WITH SIPS OF  WATER:  Gabapentin Levetiracetam (Keppra)  No Alcohol for 24 hours before or after surgery.  No Smoking including e-cigarettes for 24 hours before surgery.  No chewable tobacco products for at least 6 hours before surgery.  No nicotine patches on the day of surgery.  Do not use any "recreational" drugs for at least a week (preferably 2 weeks) before your surgery.  Please be advised that the combination of cocaine and anesthesia may have negative outcomes, up to and including death. If you test positive for cocaine, your surgery will be cancelled.  On the morning of surgery brush your teeth with toothpaste and water, you may rinse your mouth with mouthwash if you wish. Do not swallow any toothpaste or mouthwash.  Use CHG Soap as directed on instruction sheet.  Do not wear jewelry, make-up, hairpins, clips or nail polish.  For welded (permanent) jewelry: bracelets, anklets, waist bands, etc.  Please have this removed prior to surgery.  If it is not removed, there is a chance that hospital personnel will need to cut it off on the day of surgery.  Do not wear lotions, powders, or perfumes.   Do not shave body hair from the neck down 48 hours before surgery.  Contact lenses, hearing aids and dentures may not be worn into surgery.  Do not bring valuables to the hospital. [facility] is not responsible for any missing/lost belongings or valuables.   Notify your doctor if there is any change in your medical condition (cold, fever, infection).  Wear comfortable clothing (specific to your surgery type) to the  hospital.  After surgery, you can help prevent lung complications by doing breathing exercises.  Take deep breaths and cough every 1-2 hours. Your doctor may order a device called an Incentive Spirometer to help you take deep breaths.  If you are being admitted to the hospital overnight, leave your suitcase in the car. After surgery it may be brought to your room.  In case of  increased patient census, it may be necessary for you, the patient, to continue your postoperative care in the Same Day Surgery department.  If you are being discharged the day of surgery, you will not be allowed to drive home. You will need a responsible individual to drive you home and stay with you for 24 hours after surgery.   If you are taking public transportation, you will need to have a responsible individual with you.  Please call the Pre-admissions Testing Dept. at [phone] if you have any questions about these instructions.  Surgery Visitation Policy:  Patients having surgery or a procedure may have two visitors.  Children under the age of 28 must have an adult with them who is not the patient.  Temporary Visitor Restrictions Due to increasing cases of flu, RSV and COVID-19: Children ages 28 and under will not be able to visit patients in [facility] hospitals under most circumstances.  Inpatient Visitation:    Visiting hours are 7 a.m. to 8 p.m. Up to four visitors are allowed at one time in a patient room. The visitors may rotate out with other people during the day.  One visitor age 15 or older may stay with the patient overnight and must be in the room by 8 p.m.    Pre-operative 5 CHG Bath Instructions   You can play a key role in reducing the risk of infection after surgery. Your skin needs to be as free of germs as possible. You can reduce the number of germs on your skin by washing with CHG (chlorhexidine gluconate) soap before surgery. CHG is an antiseptic soap that kills germs and continues to kill germs even after washing.   DO NOT use if you have an allergy to chlorhexidine/CHG or antibacterial soaps. If your skin becomes reddened or irritated, stop using the CHG and notify one of our RNs at [phone].   Please shower with the CHG soap starting 4 days before surgery using the following schedule:     Please keep in mind the following:  DO NOT shave,  including legs and underarms, starting the day of your first shower.   You may shave your face at any point before/day of surgery.  Place clean sheets on your bed the day you start using CHG soap. Use a clean washcloth (not used since being washed) for each shower. DO NOT sleep with pets once you start using the CHG.   CHG Shower Instructions:  If you choose to wash your hair and private area, wash first with your normal shampoo/soap.  After you use shampoo/soap, rinse your hair and body thoroughly to remove shampoo/soap residue.  Turn the water OFF and apply about 3 tablespoons (45 ml) of CHG soap to a CLEAN washcloth.  Apply CHG soap ONLY FROM YOUR NECK DOWN TO YOUR TOES (washing for 3-5 minutes)  DO NOT use CHG soap on face, private areas, open wounds, or sores.  Pay special attention to the area where your surgery is being performed.  If you are having back surgery, having someone wash your back for you may  be helpful. Wait 2 minutes after CHG soap is applied, then you may rinse off the CHG soap.  Pat dry with a clean towel  Put on clean clothes/pajamas   If you choose to wear lotion, please use ONLY the CHG-compatible lotions on the back of this paper.     Additional instructions for the day of surgery: DO NOT APPLY any lotions, deodorants, cologne, or perfumes.   Put on clean/comfortable clothes.  Brush your teeth.  Ask your nurse before applying any prescription medications to the skin.      CHG Compatible Lotions   Aveeno Moisturizing lotion  Cetaphil Moisturizing Cream  Cetaphil Moisturizing Lotion  Clairol Herbal Essence Moisturizing Lotion, Dry Skin  Clairol Herbal Essence Moisturizing Lotion, Extra Dry Skin  Clairol Herbal Essence Moisturizing Lotion, Normal Skin  Curel Age Defying Therapeutic Moisturizing Lotion with Alpha Hydroxy  Curel Extreme Care Body Lotion  Curel Soothing Hands Moisturizing Hand Lotion  Curel Therapeutic Moisturizing Cream, Fragrance-Free   Curel Therapeutic Moisturizing Lotion, Fragrance-Free  Curel Therapeutic Moisturizing Lotion, Original Formula  Eucerin Daily Replenishing Lotion  Eucerin Dry Skin Therapy Plus Alpha Hydroxy Crme  Eucerin Dry Skin Therapy Plus Alpha Hydroxy Lotion  Eucerin Original Crme  Eucerin Original Lotion  Eucerin Plus Crme Eucerin Plus Lotion  Eucerin TriLipid Replenishing Lotion  Keri Anti-Bacterial Hand Lotion  Keri Deep Conditioning Original Lotion Dry Skin Formula Softly Scented  Keri Deep Conditioning Original Lotion, Fragrance Free Sensitive Skin Formula  Keri Lotion Fast Absorbing Fragrance Free Sensitive Skin Formula  Keri Lotion Fast Absorbing Softly Scented Dry Skin Formula  Keri Original Lotion  Keri Skin Renewal Lotion Keri Silky Smooth Lotion  Keri Silky Smooth Sensitive Skin Lotion  Nivea Body Creamy Conditioning Oil  Nivea Body Extra Enriched Lotion  Nivea Body Original Lotion  Nivea Body Sheer Moisturizing Lotion Nivea Crme  Nivea Skin Firming Lotion  NutraDerm 30 Skin Lotion  NutraDerm Skin Lotion  NutraDerm Therapeutic Skin Cream  NutraDerm Therapeutic Skin Lotion  ProShield Protective Hand Cream  Provon moisturizing lotion   Preoperative Educational Videos for Total Hip, Knee and Shoulder Replacements  To better prepare for surgery, please view our videos that explain the physical activity and discharge planning required to have the best surgical recovery at [facility]  [email or web address]  Questions? Call [phone] or email [email or web address]

## 2024-02-22 ENCOUNTER — Ambulatory Visit: Payer: 59

## 2024-02-22 ENCOUNTER — Other Ambulatory Visit: Payer: Self-pay

## 2024-02-22 ENCOUNTER — Ambulatory Visit: Payer: 59 | Admitting: Registered Nurse

## 2024-02-22 ENCOUNTER — Encounter
Admission: RE | Disposition: A | Discharge status: Home / Self Care | Payer: Self-pay | Source: Home / Self Care | Attending: Surgery

## 2024-02-22 ENCOUNTER — Ambulatory Visit
Admission: RE | Admit: 2024-02-22 | Discharge: 2024-02-23 | Disposition: A | Discharge status: Home / Self Care | Payer: 59 | Attending: Surgery | Admitting: Surgery

## 2024-02-22 ENCOUNTER — Ambulatory Visit: Payer: 59 | Admitting: Urgent Care

## 2024-02-22 ENCOUNTER — Encounter: Payer: Self-pay | Admitting: Surgery

## 2024-02-22 DIAGNOSIS — K219 Gastro-esophageal reflux disease without esophagitis: Secondary | ICD-10-CM | POA: Insufficient documentation

## 2024-02-22 DIAGNOSIS — G4733 Obstructive sleep apnea (adult) (pediatric): Secondary | ICD-10-CM | POA: Insufficient documentation

## 2024-02-22 DIAGNOSIS — G40909 Epilepsy, unspecified, not intractable, without status epilepticus: Secondary | ICD-10-CM | POA: Diagnosis not present

## 2024-02-22 DIAGNOSIS — E669 Obesity, unspecified: Secondary | ICD-10-CM | POA: Insufficient documentation

## 2024-02-22 DIAGNOSIS — Z96652 Presence of left artificial knee joint: Secondary | ICD-10-CM

## 2024-02-22 DIAGNOSIS — M1712 Unilateral primary osteoarthritis, left knee: Secondary | ICD-10-CM | POA: Insufficient documentation

## 2024-02-22 DIAGNOSIS — F32A Depression, unspecified: Secondary | ICD-10-CM | POA: Insufficient documentation

## 2024-02-22 DIAGNOSIS — Z8673 Personal history of transient ischemic attack (TIA), and cerebral infarction without residual deficits: Secondary | ICD-10-CM | POA: Insufficient documentation

## 2024-02-22 DIAGNOSIS — M1612 Unilateral primary osteoarthritis, left hip: Secondary | ICD-10-CM | POA: Diagnosis not present

## 2024-02-22 DIAGNOSIS — Z01812 Encounter for preprocedural laboratory examination: Secondary | ICD-10-CM

## 2024-02-22 DIAGNOSIS — Z6838 Body mass index (BMI) 38.0-38.9, adult: Secondary | ICD-10-CM | POA: Insufficient documentation

## 2024-02-22 DIAGNOSIS — M797 Fibromyalgia: Secondary | ICD-10-CM | POA: Insufficient documentation

## 2024-02-22 DIAGNOSIS — M25561 Pain in right knee: Secondary | ICD-10-CM | POA: Diagnosis not present

## 2024-02-22 DIAGNOSIS — Z471 Aftercare following joint replacement surgery: Secondary | ICD-10-CM | POA: Diagnosis not present

## 2024-02-22 HISTORY — PX: TOTAL KNEE ARTHROPLASTY: SHX125

## 2024-02-22 SURGERY — ARTHROPLASTY, KNEE, TOTAL
Anesthesia: General | Site: Knee | Laterality: Left

## 2024-02-22 MED ORDER — CEFAZOLIN SODIUM-DEXTROSE 2-4 GM/100ML-% IV SOLN
2.0000 g | Freq: Four times a day (QID) | INTRAVENOUS | Status: AC
  Administered 2024-02-22 (×2): 2 g via INTRAVENOUS
  Filled 2024-02-22: qty 100

## 2024-02-22 MED ORDER — PROPOFOL 10 MG/ML IV BOLUS
INTRAVENOUS | Status: DC | PRN
  Administered 2024-02-22: 30 ug/kg/min via INTRAVENOUS
  Administered 2024-02-22: 200 mg via INTRAVENOUS

## 2024-02-22 MED ORDER — PROPOFOL 1000 MG/100ML IV EMUL
INTRAVENOUS | Status: AC
  Filled 2024-02-22: qty 100

## 2024-02-22 MED ORDER — DEXAMETHASONE SODIUM PHOSPHATE 10 MG/ML IJ SOLN
INTRAMUSCULAR | Status: DC | PRN
  Administered 2024-02-22: 10 mg via INTRAVENOUS

## 2024-02-22 MED ORDER — TRANEXAMIC ACID-NACL 1000-0.7 MG/100ML-% IV SOLN
1000.0000 mg | INTRAVENOUS | Status: AC
  Administered 2024-02-22: 1000 mg via INTRAVENOUS

## 2024-02-22 MED ORDER — HYDROMORPHONE HCL 1 MG/ML IJ SOLN
INTRAMUSCULAR | Status: DC | PRN
  Administered 2024-02-22 (×2): .5 mg via INTRAVENOUS

## 2024-02-22 MED ORDER — TRIAMCINOLONE ACETONIDE 40 MG/ML IJ SUSP
INTRAMUSCULAR | Status: AC
Start: 2024-02-22 — End: ?
  Filled 2024-02-22: qty 1

## 2024-02-22 MED ORDER — MAGNESIUM HYDROXIDE 400 MG/5ML PO SUSP: 30.0000 mL | Freq: Every day | ORAL | Status: DC | PRN

## 2024-02-22 MED ORDER — OXYBUTYNIN CHLORIDE ER 5 MG PO TB24
10.0000 mg | ORAL_TABLET | Freq: Every day | ORAL | Status: DC
  Administered 2024-02-22: 10 mg via ORAL
  Filled 2024-02-22: qty 2

## 2024-02-22 MED ORDER — LIDOCAINE HCL (CARDIAC) PF 100 MG/5ML IV SOSY
PREFILLED_SYRINGE | INTRAVENOUS | Status: DC | PRN
  Administered 2024-02-22: 50 mg via INTRAVENOUS

## 2024-02-22 MED ORDER — LACTATED RINGERS IV SOLN: INTRAVENOUS | Status: DC

## 2024-02-22 MED ORDER — ONDANSETRON HCL 4 MG/2ML IJ SOLN: 4.0000 mg | Freq: Four times a day (QID) | INTRAMUSCULAR | Status: DC | PRN

## 2024-02-22 MED ORDER — BUPIVACAINE HCL (PF) 0.5 % IJ SOLN
INTRAMUSCULAR | Status: AC
  Filled 2024-02-22: qty 10

## 2024-02-22 MED ORDER — ONDANSETRON HCL 4 MG/2ML IJ SOLN: 4.0000 mg | Freq: Once | INTRAMUSCULAR | Status: DC | PRN

## 2024-02-22 MED ORDER — ACETAMINOPHEN 325 MG PO TABS: 325.0000 mg | ORAL_TABLET | Freq: Four times a day (QID) | ORAL | Status: DC | PRN

## 2024-02-22 MED ORDER — KETOROLAC TROMETHAMINE 30 MG/ML IJ SOLN
30.0000 mg | Freq: Once | INTRAMUSCULAR | Status: AC
  Administered 2024-02-22: 30 mg via INTRAVENOUS

## 2024-02-22 MED ORDER — PHENYLEPHRINE HCL-NACL 20-0.9 MG/250ML-% IV SOLN
INTRAVENOUS | Status: AC
  Filled 2024-02-22: qty 250

## 2024-02-22 MED ORDER — BISACODYL 10 MG RE SUPP: 10.0000 mg | Freq: Every day | RECTAL | Status: DC | PRN

## 2024-02-22 MED ORDER — FENTANYL CITRATE (PF) 100 MCG/2ML IJ SOLN: 25.0000 ug | INTRAMUSCULAR | Status: DC | PRN

## 2024-02-22 MED ORDER — CEFAZOLIN SODIUM-DEXTROSE 2-4 GM/100ML-% IV SOLN
INTRAVENOUS | Status: AC
  Filled 2024-02-22: qty 100

## 2024-02-22 MED ORDER — ACETAMINOPHEN 10 MG/ML IV SOLN
INTRAVENOUS | Status: AC
  Filled 2024-02-22: qty 100

## 2024-02-22 MED ORDER — BUPIVACAINE HCL (PF) 0.5 % IJ SOLN
INTRAMUSCULAR | Status: AC
  Filled 2024-02-22: qty 30

## 2024-02-22 MED ORDER — KETAMINE HCL 50 MG/5ML IJ SOSY
PREFILLED_SYRINGE | INTRAMUSCULAR | Status: AC
  Filled 2024-02-22: qty 5

## 2024-02-22 MED ORDER — ACETAMINOPHEN 10 MG/ML IV SOLN: 1000.0000 mg | Freq: Once | INTRAVENOUS | Status: DC | PRN

## 2024-02-22 MED ORDER — BUPIVACAINE-EPINEPHRINE (PF) 0.5% -1:200000 IJ SOLN
INTRAMUSCULAR | Status: DC | PRN
  Administered 2024-02-22: 30 mL

## 2024-02-22 MED ORDER — OXYCODONE HCL 5 MG PO TABS
5.0000 mg | ORAL_TABLET | ORAL | Status: DC | PRN
  Administered 2024-02-22 (×2): 10 mg via ORAL
  Administered 2024-02-22 – 2024-02-23 (×2): 5 mg via ORAL
  Filled 2024-02-22: qty 2
  Filled 2024-02-22: qty 1

## 2024-02-22 MED ORDER — PROPOFOL 10 MG/ML IV BOLUS
INTRAVENOUS | Status: AC
  Filled 2024-02-22: qty 20

## 2024-02-22 MED ORDER — METOCLOPRAMIDE HCL 5 MG/ML IJ SOLN: 5.0000 mg | Freq: Three times a day (TID) | INTRAMUSCULAR | Status: DC | PRN

## 2024-02-22 MED ORDER — DEXMEDETOMIDINE HCL IN NACL 80 MCG/20ML IV SOLN
INTRAVENOUS | Status: AC
  Filled 2024-02-22: qty 20

## 2024-02-22 MED ORDER — KETOROLAC TROMETHAMINE 15 MG/ML IJ SOLN
7.5000 mg | Freq: Four times a day (QID) | INTRAMUSCULAR | Status: DC
  Administered 2024-02-23 (×2): 7.5 mg via INTRAVENOUS
  Filled 2024-02-22 (×2): qty 1

## 2024-02-22 MED ORDER — MIDAZOLAM HCL 2 MG/2ML IJ SOLN
INTRAMUSCULAR | Status: AC
  Filled 2024-02-22: qty 2

## 2024-02-22 MED ORDER — OXYCODONE HCL 5 MG/5ML PO SOLN: 5.0000 mg | Freq: Once | ORAL | Status: DC | PRN

## 2024-02-22 MED ORDER — METOCLOPRAMIDE HCL 10 MG PO TABS: 5.0000 mg | ORAL_TABLET | Freq: Three times a day (TID) | ORAL | Status: DC | PRN

## 2024-02-22 MED ORDER — ACETAMINOPHEN 500 MG PO TABS
1000.0000 mg | ORAL_TABLET | Freq: Four times a day (QID) | ORAL | Status: DC
  Administered 2024-02-22 – 2024-02-23 (×2): 1000 mg via ORAL
  Filled 2024-02-22 (×3): qty 2

## 2024-02-22 MED ORDER — GABAPENTIN 100 MG PO CAPS
300.0000 mg | ORAL_CAPSULE | Freq: Two times a day (BID) | ORAL | Status: DC
  Administered 2024-02-22 – 2024-02-23 (×2): 300 mg via ORAL
  Filled 2024-02-22 (×2): qty 3

## 2024-02-22 MED ORDER — TRIAMCINOLONE ACETONIDE 40 MG/ML IJ SUSP
INTRAMUSCULAR | Status: DC | PRN
  Administered 2024-02-22: 80 mg via INTRAMUSCULAR

## 2024-02-22 MED ORDER — DOCUSATE SODIUM 100 MG PO CAPS
100.0000 mg | ORAL_CAPSULE | Freq: Two times a day (BID) | ORAL | Status: DC
  Administered 2024-02-22 – 2024-02-23 (×2): 100 mg via ORAL
  Filled 2024-02-22 (×2): qty 1

## 2024-02-22 MED ORDER — CHLORHEXIDINE GLUCONATE 0.12 % MT SOLN
15.0000 mL | Freq: Once | OROMUCOSAL | Status: AC
Start: 2024-02-22 — End: 2024-02-22
  Administered 2024-02-22: 15 mL via OROMUCOSAL

## 2024-02-22 MED ORDER — KETOROLAC TROMETHAMINE 30 MG/ML IJ SOLN
INTRAMUSCULAR | Status: DC | PRN
  Administered 2024-02-22: 15 mg via INTRAMUSCULAR

## 2024-02-22 MED ORDER — FENTANYL CITRATE (PF) 100 MCG/2ML IJ SOLN
INTRAMUSCULAR | Status: AC
  Filled 2024-02-22: qty 2

## 2024-02-22 MED ORDER — SODIUM CHLORIDE 0.9 % IV SOLN
INTRAVENOUS | Status: DC | PRN
  Administered 2024-02-22: 60 mL

## 2024-02-22 MED ORDER — EPINEPHRINE PF 1 MG/ML IJ SOLN
INTRAMUSCULAR | Status: AC
  Filled 2024-02-22: qty 1

## 2024-02-22 MED ORDER — ONDANSETRON HCL 4 MG/2ML IJ SOLN
INTRAMUSCULAR | Status: DC | PRN
  Administered 2024-02-22: 4 mg via INTRAVENOUS

## 2024-02-22 MED ORDER — OXYCODONE HCL 5 MG PO TABS: 5.0000 mg | ORAL_TABLET | Freq: Once | ORAL | Status: DC | PRN

## 2024-02-22 MED ORDER — SODIUM CHLORIDE (PF) 0.9 % IJ SOLN
INTRAMUSCULAR | Status: AC
  Filled 2024-02-22: qty 40

## 2024-02-22 MED ORDER — ACETAMINOPHEN 10 MG/ML IV SOLN
INTRAVENOUS | Status: DC | PRN
  Administered 2024-02-22: 1000 mg via INTRAVENOUS

## 2024-02-22 MED ORDER — HYDROMORPHONE HCL 1 MG/ML IJ SOLN
INTRAMUSCULAR | Status: AC
Start: 2024-02-22 — End: ?
  Filled 2024-02-22: qty 1

## 2024-02-22 MED ORDER — CHLORHEXIDINE GLUCONATE 0.12 % MT SOLN
OROMUCOSAL | Status: AC
Start: 2024-02-22 — End: ?
  Filled 2024-02-22: qty 15

## 2024-02-22 MED ORDER — KETOROLAC TROMETHAMINE 30 MG/ML IJ SOLN
INTRAMUSCULAR | Status: AC
  Filled 2024-02-22: qty 1

## 2024-02-22 MED ORDER — DEXTROSE-SODIUM CHLORIDE 5-0.9 % IV SOLN: INTRAVENOUS | Status: DC

## 2024-02-22 MED ORDER — FLEET ENEMA RE ENEM: 1.0000 | ENEMA | Freq: Once | RECTAL | Status: DC | PRN

## 2024-02-22 MED ORDER — LIDOCAINE HCL (PF) 2 % IJ SOLN
INTRAMUSCULAR | Status: AC
  Filled 2024-02-22: qty 5

## 2024-02-22 MED ORDER — OXYCODONE HCL 5 MG PO TABS
ORAL_TABLET | ORAL | Status: AC
  Filled 2024-02-22: qty 2

## 2024-02-22 MED ORDER — APIXABAN 2.5 MG PO TABS: 2.5000 mg | ORAL_TABLET | Freq: Two times a day (BID) | ORAL | 0 refills | Status: DC

## 2024-02-22 MED ORDER — DIPHENHYDRAMINE HCL 12.5 MG/5ML PO ELIX: 12.5000 mg | ORAL_SOLUTION | ORAL | Status: DC | PRN

## 2024-02-22 MED ORDER — ORAL CARE MOUTH RINSE: 15.0000 mL | Freq: Once | OROMUCOSAL | Status: AC

## 2024-02-22 MED ORDER — ONDANSETRON HCL 4 MG PO TABS: 4.0000 mg | ORAL_TABLET | Freq: Four times a day (QID) | ORAL | Status: DC | PRN

## 2024-02-22 MED ORDER — LEVETIRACETAM 750 MG PO TABS
750.0000 mg | ORAL_TABLET | Freq: Two times a day (BID) | ORAL | Status: DC
  Administered 2024-02-22 – 2024-02-23 (×2): 750 mg via ORAL
  Filled 2024-02-22 (×2): qty 1

## 2024-02-22 MED ORDER — OXYCODONE HCL 5 MG PO TABS: 5.0000 mg | ORAL_TABLET | ORAL | 0 refills | Status: DC | PRN

## 2024-02-22 MED ORDER — PHENYLEPHRINE HCL-NACL 20-0.9 MG/250ML-% IV SOLN
INTRAVENOUS | Status: DC | PRN
Start: 2024-02-22 — End: 2024-02-22
  Administered 2024-02-22: 20 ug/min via INTRAVENOUS

## 2024-02-22 MED ORDER — ONDANSETRON HCL 4 MG/2ML IJ SOLN
4.0000 mg | Freq: Four times a day (QID) | INTRAMUSCULAR | Status: DC | PRN
Start: 2024-02-22 — End: 2024-02-23

## 2024-02-22 MED ORDER — TRANEXAMIC ACID-NACL 1000-0.7 MG/100ML-% IV SOLN
INTRAVENOUS | Status: AC
  Filled 2024-02-22: qty 100

## 2024-02-22 MED ORDER — TRAZODONE HCL 50 MG PO TABS
50.0000 mg | ORAL_TABLET | Freq: Every day | ORAL | Status: DC
  Administered 2024-02-22: 50 mg via ORAL
  Filled 2024-02-22: qty 1

## 2024-02-22 MED ORDER — APIXABAN 2.5 MG PO TABS
2.5000 mg | ORAL_TABLET | Freq: Two times a day (BID) | ORAL | Status: DC
  Administered 2024-02-23: 2.5 mg via ORAL

## 2024-02-22 MED ORDER — TRIAMCINOLONE ACETONIDE 40 MG/ML IJ SUSP
INTRAMUSCULAR | Status: AC
  Filled 2024-02-22: qty 1

## 2024-02-22 MED ORDER — BUPIVACAINE LIPOSOME 1.3 % IJ SUSP
INTRAMUSCULAR | Status: AC
  Filled 2024-02-22: qty 20

## 2024-02-22 MED ORDER — FENTANYL CITRATE (PF) 100 MCG/2ML IJ SOLN
INTRAMUSCULAR | Status: DC | PRN
  Administered 2024-02-22 (×2): 50 ug via INTRAVENOUS

## 2024-02-22 MED ORDER — MIDAZOLAM HCL 2 MG/2ML IJ SOLN
INTRAMUSCULAR | Status: DC | PRN
  Administered 2024-02-22: 2 mg via INTRAVENOUS

## 2024-02-22 MED ORDER — KETAMINE HCL 50 MG/5ML IJ SOSY
PREFILLED_SYRINGE | INTRAMUSCULAR | Status: DC | PRN
  Administered 2024-02-22 (×2): 25 mg via INTRAVENOUS

## 2024-02-22 MED ORDER — CEFAZOLIN SODIUM-DEXTROSE 2-4 GM/100ML-% IV SOLN
2.0000 g | Freq: Once | INTRAVENOUS | Status: AC
  Administered 2024-02-22: 2 g via INTRAVENOUS

## 2024-02-22 MED ORDER — DEXMEDETOMIDINE HCL IN NACL 80 MCG/20ML IV SOLN
INTRAVENOUS | Status: DC | PRN
  Administered 2024-02-22 (×2): 8 ug via INTRAVENOUS

## 2024-02-22 MED ORDER — ONDANSETRON HCL 4 MG PO TABS
4.0000 mg | ORAL_TABLET | Freq: Four times a day (QID) | ORAL | Status: DC | PRN
Start: 2024-02-22 — End: 2024-02-23

## 2024-02-22 SURGICAL SUPPLY — 59 items
BLADE SAW 90X13X1.19 OSCILLAT (BLADE) ×2 IMPLANT
BLADE SAW SAG 25X90X1.19 (BLADE) ×2 IMPLANT
BLADE SURG SZ20 CARB STEEL (BLADE) ×2 IMPLANT
BNDG ELASTIC 6X5.8 VLCR NS LF (GAUZE/BANDAGES/DRESSINGS) ×2 IMPLANT
CEMENT BONE R 1X40 (Cement) ×4 IMPLANT
CEMENT VACUUM MIXING SYSTEM (MISCELLANEOUS) ×2 IMPLANT
CHLORAPREP W/TINT 26 (MISCELLANEOUS) ×2 IMPLANT
COMP FEM CEMT PERS SZ 7 LT (Joint) ×1 IMPLANT
COMP TIB PS KNEE E 0D LT (Joint) ×1 IMPLANT
COMPONENT FEM CMT PERS SZ7 LT (Joint) IMPLANT
COMPONENT PATELLAR VGD 7.8X3 (Joint) IMPLANT
COMPONET TIB PS KNEE E 0D LT (Joint) IMPLANT
COOLER POLAR GLACIER W/PUMP (MISCELLANEOUS) ×2 IMPLANT
COVER MAYO STAND STRL (DRAPES) ×2 IMPLANT
CUFF TRNQT CYL 24X4X16.5-23 (TOURNIQUET CUFF) IMPLANT
CUFF TRNQT CYL 34X4.125X (TOURNIQUET CUFF) IMPLANT
DRAPE IMP U-DRAPE 54X76 (DRAPES) ×2 IMPLANT
DRAPE SHEET LG 3/4 BI-LAMINATE (DRAPES) ×2 IMPLANT
DRAPE U-SHAPE 47X51 STRL (DRAPES) ×2 IMPLANT
DRSG MEPILEX SACRM 8.7X9.8 (GAUZE/BANDAGES/DRESSINGS) IMPLANT
DRSG OPSITE POSTOP 4X10 (GAUZE/BANDAGES/DRESSINGS) ×2 IMPLANT
DRSG OPSITE POSTOP 4X8 (GAUZE/BANDAGES/DRESSINGS) ×2 IMPLANT
ELECT CAUTERY BLADE 6.4 (BLADE) ×2 IMPLANT
ELECT REM PT RETURN 9FT ADLT (ELECTROSURGICAL) ×1
ELECTRODE REM PT RTRN 9FT ADLT (ELECTROSURGICAL) ×2 IMPLANT
GAUZE XEROFORM 1X8 LF (GAUZE/BANDAGES/DRESSINGS) ×2 IMPLANT
GLOVE BIO SURGEON STRL SZ7.5 (GLOVE) ×8 IMPLANT
GLOVE BIO SURGEON STRL SZ8 (GLOVE) ×8 IMPLANT
GLOVE BIOGEL PI IND STRL 8 (GLOVE) ×2 IMPLANT
GLOVE INDICATOR 8.0 STRL GRN (GLOVE) ×2 IMPLANT
GOWN STRL REUS W/ TWL LRG LVL3 (GOWN DISPOSABLE) ×2 IMPLANT
GOWN STRL REUS W/ TWL XL LVL3 (GOWN DISPOSABLE) ×2 IMPLANT
HOOD PEEL AWAY T7 (MISCELLANEOUS) ×6 IMPLANT
IV NS IRRIG 3000ML ARTHROMATIC (IV SOLUTION) ×2 IMPLANT
KIT TURNOVER KIT A (KITS) ×2 IMPLANT
MANIFOLD NEPTUNE II (INSTRUMENTS) ×2 IMPLANT
NDL SPNL 20GX3.5 QUINCKE YW (NEEDLE) ×2 IMPLANT
NEEDLE SPNL 20GX3.5 QUINCKE YW (NEEDLE) ×1
NS IRRIG 500ML POUR BTL (IV SOLUTION) ×2 IMPLANT
PACK TOTAL KNEE (MISCELLANEOUS) ×2 IMPLANT
PAD WRAPON POLAR KNEE (MISCELLANEOUS) ×2 IMPLANT
PAD WRAPON POLOR MULTI XL (MISCELLANEOUS) IMPLANT
PENCIL SMOKE EVACUATOR (MISCELLANEOUS) ×2 IMPLANT
PIN DRILL HDLS TROCAR 75 4PK (PIN) IMPLANT
PULSAVAC PLUS IRRIG FAN TIP (DISPOSABLE) ×1
SCREW FEMALE HEX FIX 25X2.5 (ORTHOPEDIC DISPOSABLE SUPPLIES) IMPLANT
STAPLER SKIN PROX 35W (STAPLE) ×2 IMPLANT
STEM TIBIAL SZ6-7 EF 12 LT (Knees) IMPLANT
SUCTION TUBE FRAZIER 10FR DISP (SUCTIONS) ×2 IMPLANT
SUT VIC AB 0 CT1 36 (SUTURE) ×6 IMPLANT
SUT VIC AB 2-0 CT1 TAPERPNT 27 (SUTURE) ×6 IMPLANT
SYR 10ML LL (SYRINGE) ×2 IMPLANT
SYR 20ML LL LF (SYRINGE) ×2 IMPLANT
SYR 30ML LL (SYRINGE) IMPLANT
TIP FAN IRRIG PULSAVAC PLUS (DISPOSABLE) ×2 IMPLANT
TRAP FLUID SMOKE EVACUATOR (MISCELLANEOUS) ×2 IMPLANT
WATER STERILE IRR 500ML POUR (IV SOLUTION) ×2 IMPLANT
WRAP-ON POLOR PAD MULTI XL (MISCELLANEOUS) ×1
WRAPON POLAR PAD KNEE (MISCELLANEOUS)

## 2024-02-22 NOTE — Progress Notes (Signed)
 Patient spilled coffee on herself, skin intact with no areas of injury. Soiled clothes and linens. Patient cleaned up by this RN, gown and linens changed.

## 2024-02-22 NOTE — Transfer of Care (Signed)
 Immediate Anesthesia Transfer of Care Note  Patient: Kristen Hudson  Procedure(s) Performed: TOTAL KNEE ARTHROPLASTY (Left: Knee)  Patient Location: PACU  Anesthesia Type:General  Level of Consciousness: drowsy and patient cooperative  Airway & Oxygen Therapy: Patient Spontanous Breathing  Post-op Assessment: Report given to RN and Post -op Vital signs reviewed and stable  Post vital signs: Reviewed and stable  Last Vitals:  Vitals Value Taken Time  BP 129/92 02/22/24 1002  Temp 37.2 C 02/22/24 1002  Pulse 87 02/22/24 1003  Resp 15 02/22/24 1003  SpO2 96 % 02/22/24 1003  Vitals shown include unfiled device data.  Last Pain:  Vitals:   02/22/24 1002  TempSrc: Tympanic  PainSc: 0-No pain         Complications: No notable events documented.

## 2024-02-22 NOTE — Anesthesia Postprocedure Evaluation (Signed)
 Anesthesia Post Note  Patient: Kristen Hudson  Procedure(s) Performed: TOTAL KNEE ARTHROPLASTY (Left: Knee)  Patient location during evaluation: PACU Anesthesia Type: Spinal Level of consciousness: awake and alert, oriented and patient cooperative Pain management: pain level controlled Vital Signs Assessment: post-procedure vital signs reviewed and stable Respiratory status: spontaneous breathing, nonlabored ventilation and respiratory function stable Cardiovascular status: blood pressure returned to baseline and stable Postop Assessment: adequate PO intake Anesthetic complications: no   No notable events documented.   Last Vitals:  Vitals:   02/22/24 1043 02/22/24 1052  BP: 131/86 118/87  Pulse: 74 81  Resp: 14 13  Temp: 36.9 C 36.8 C  SpO2: 94% 95%    Last Pain:  Vitals:   02/22/24 1052  TempSrc: Temporal  PainSc: 0-No pain                 Reed Breech

## 2024-02-22 NOTE — Op Note (Signed)
 02/22/2024  9:53 AM  Patient:   Kristen Hudson  Pre-Op Diagnosis:   Degenerative joint disease, left knee.  Post-Op Diagnosis:   Same  Procedure:   Left TKA using all-cemented Zimmer Persona system with a #7 narrow PCR femur, a(n) E-sized  tibial tray with a 12 mm medial congruent E-poly insert, and a Vanguard 7.8 x 34 mm all-poly 3-pegged domed patella.  Surgeon:   Maryagnes Amos, MD  Assistant:   Horris Latino, PA-C   Anesthesia:   General LMA  Findings:   As above  Complications:   None  EBL:   20 cc  Fluids:   300 cc crystalloid  UOP:   None  TT:   90 minutes at 300 mmHg  Drains:   None  Closure:   Staples  Implants:   As above  Brief Clinical Note:   The patient is a 58 year old female with a long history of progressively worsening left knee pain. The patient's symptoms have progressed despite medications, activity modification, injections, etc. The patient's history and examination were consistent with advanced degenerative joint disease of the left knee confirmed by plain radiographs. The patient presents at this time for a left total knee arthroplasty.  Procedure:   The patient was brought into the operating room. After adequate spinal anesthesia was obtained, the patient was repositioned in the supine position on the operating room table. The left lower extremity was prepped with ChloraPrep solution and draped sterilely. Preoperative antibiotics were administered. A timeout was performed to verify the appropriate surgical site before the limb was exsanguinated with an Esmarch and the tourniquet inflated to 300 mmHg.   A standard anterior approach to the knee was made through an approximately 6-7 inch incision. The incision was carried down through the subcutaneous tissues to expose superficial retinaculum. This was split the length of the incision and the medial flap elevated sufficiently to expose the medial retinaculum. The medial retinaculum was incised, leaving a 3-4  mm cuff of tissue on the patella. This was extended distally along the medial border of the patellar tendon and proximally through the medial third of the quadriceps tendon. A subtotal fat pad excision was performed before the soft tissues were elevated off the anteromedial and anterolateral aspects of the proximal tibia to the level of the collateral ligaments. The anterior portions of the medial and lateral menisci were removed, as was the anterior cruciate ligament. With the knee flexed to 90, the external tibial guide was positioned and the appropriate proximal tibial cut made. This piece was taken to the back table where it was measured and found to be optimally replicated by a(n) E-sized component.  Attention was directed to the distal femur. The intramedullary canal was accessed through a 3/8" drill hole. The intramedullary guide was inserted and placed at 5 of valgus alignment. Using the +0 slot, the distal cut was made. The distal femur was measured and found to be optimally replicated by the #7 component. The #7 4-in-1 cutting block was positioned and first the posterior, then the posterior chamfer, the anterior, and finally the anterior chamfer cuts were made after verifying that the anterior cortex would not be notched.   At this point, the posterior portions medial and lateral menisci were removed. A trial reduction was performed using the appropriate femoral and tibial components with first the 10 mm and then the 12 mm insert. The 12 mm insert demonstrated excellent stability to varus and valgus stressing both in flexion and extension while permitting  full extension. Patellar tracking was assessed and found to be excellent. Therefore, the tibial trial position was marked on the proximal tibia. The patella thickness was measured and found to be 19 mm. Therefore, the appropriate cut was made. The patellar surface was measured and found to be optimally replicated by the  34  mm Vanguard component.  The three peg holes were drilled in place before the trial button was inserted. Patella tracking was assessed and found to be excellent, passing the "no thumb test". The lug holes were drilled into the distal femur before the trial component was removed.  The tibial tray was repositioned before the keel was created using the appropriate tower, reamer, and punch.  The bony surfaces were prepared for cementing by irrigating them thoroughly with sterile saline solution via the jet lavage system. A bone plug was fashioned from some of the bone that had been removed previously and used to plug the distal femoral canal. In addition, a "cocktail" of 20 cc of Exparel, 30 cc of 0.5% Sensorcaine, 2 cc of Kenalog 40 (80 mg), and 30 mg of Toradol diluted out to 90 cc with normal saline was injected into the postero-medial and postero-lateral aspects of the knee, the medial and lateral gutter regions, and the peri-incisional tissues to help with postoperative analgesia. Meanwhile, the cement was being mixed on the back table.   When the cement was ready, the tibial tray was cemented in first. The excess cement was removed using Personal assistant. Next, the femoral component was impacted into place. Again, the excess cement was removed using Personal assistant. The 12 mm trial insert was positioned and the knee brought into extension while the cement hardened. Finally, the patella was cemented into place and secured using the patellar clamp. Again, the excess cement was removed using Personal assistant. Once the cement had hardened, the knee was placed through a range of motion with the findings as described above. Therefore, the trial insert was removed and, after verifying that no cement had been retained posteriorly, the permanent 12 mm medial congruent E-polyethylene insert was snapped into place with care taken to ensure appropriate locking of the insert. Again the knee was placed through a range of motion with the findings as  described above.  The wound was copiously irrigated with sterile saline solution using the jet lavage system before the quadriceps tendon and retinacular layer were reapproximated using #0 Vicryl interrupted sutures. The superficial retinacular layer also was closed using a running #0 Vicryl suture. The subcutaneous tissues were closed in several layers using 2-0 Vicryl interrupted sutures. The skin was closed using staples. A sterile honeycomb dressing was applied to the skin before the leg was wrapped with an Ace wrap to accommodate the Polar Care device. The patient was then awakened, extubated, and returned to the recovery room in satisfactory condition after tolerating the procedure well.

## 2024-02-22 NOTE — H&P (Signed)
 History of Present Illness: Kristen Hudson is a 58 y.o. female who presents today for her surgical history and physical for upcoming left total knee arthroplasty scheduled with Dr. Joice Lofts on 02/22/2024. The patient was evaluated by me in November of last year, she had gone to the emergency room on 11/07/2023 because of increased pain in both the left hip and left knee. The patient did undergo x-rays which revealed significant left knee osteoarthritic changes. The x-rays of the left hip did not reveal significant osteoarthritic changes however she did undergo an MRI scan which confirmed moderate arthritic change involving the left hip. She still reports that the left knee is more painful than her left hip. She reports a 9 out of 10 pain score today's visit. She is using a walker for assistance with ambulation today's visit. The patient does have a history of seizures, she has been cleared by her neurology team. She denies any personal history of heart attack, she does have a history of a TIA but is not on any blood thinning medication at this time. She is not diabetic. She denies any history of asthma or COPD but she does smoke. She does have a history of fibromyalgia. The patient has established with a primary care provider since her last evaluation with me.  Past Medical History: Anxiety  Arthritis  BPPV (benign paroxysmal positional vertigo) 05/21/2013  Cerebrovascular disease  Depression  Fibromyalgia  GERD (gastroesophageal reflux disease) 05/21/2013  History of TIA (transient ischemic attack) 05/21/2013  OSA (obstructive sleep apnea)  PONV (postoperative nausea and vomiting) - only happened once Pulmonary nodule  Seizure disorder (CMS/HHS-HCC) 05/23/2013  Seizures (CMS/HHS-HCC) - last seizure approx. 1 year ago  Stroke (CMS/HHS-HCC) 2007 - sometimes lip numbess  Uterine prolapse   Past Surgical History: APPENDECTOMY 1972  LEFT OOPHORECTOMY 1994  CESAREAN SECTION 1994 x3  REPAIR VENTRAL HERNIA  LAPAROSCOPIC 2004  COLONOSCOPY IN OR N/A 01/18/2015  Procedure: COLONOSCOPY IN OR, FLEXIBLE; DIAGNOSTIC, INCLUDING COLLECTION OF SPECIMEN(S) BY BRUSHING OR WASHING, WHEN PERFORMED (SEPARATE PROCEDURE); Surgeon: Glynis Smiles, MD; Location: [facility] OR; Service: General Surgery; Laterality: N/A;  HEMORRHOIDECTOMY BY SIMPLE LIGATION N/A 01/18/2015  Procedure: HEMORRHOIDECTOMY BY SIMPLE LIGATION; Surgeon: Glynis Smiles, MD; Location: [facility] OR; Service: General Surgery; Laterality: N/A;  CHOLECYSTECTOMY  COLON SURGERY  HERNIA REPAIR  HYSTERECTOMY  KNEE ARTHROSCOPY  OOPHORECTOMY Right  Robot assisted partial colon removal   Past Family History: Cancer Mother  Heart disease Mother  Lung cancer Father  Colon cancer Father  Diabetes Father  Cancer Father  Arthritis Father  Substance Abuse Brother   Medications: baclofen 5 mg Tab Take by mouth (Patient not taking: Reported on 01/05/2022)  celecoxib (CELEBREX) 200 MG capsule Take 1 capsule (200 mg total) by mouth 2 (two) times daily 60 capsule 0  diazePAM (VALIUM) 10 MG tablet Take 10 mg by mouth every 12 (twelve) hours as needed (Patient not taking: Reported on 11/16/2023)  gabapentin (NEURONTIN) 100 MG capsule 2 capsules p.o. 3 times daily 180 capsule 0  HYDROcodone-acetaminophen (NORCO) 5-325 mg tablet Take one tablet at night for pain; may take up to every 6 hours as needed for pain if not working or driving 20 tablet 0  levETIRAcetam (KEPPRA) 500 MG tablet Take 1 tablet (500 mg total) by mouth 2 (two) times daily 180 tablet 3  meloxicam (MOBIC) 15 MG tablet Take 1 tablet by mouth once daily  naproxen (NAPROSYN) 500 MG tablet Take 500 mg by mouth 2 (two) times daily as needed (Patient not taking:  Reported on 11/16/2023)  omeprazole (PRILOSEC) 20 MG DR capsule Take 20 mg by mouth once daily  oxyBUTYnin (DITROPAN-XL) 10 MG XL tablet Take 10 mg by mouth at bedtime  oxyCODONE-acetaminophen (PERCOCET) 5-325 mg tablet Take by mouth (Patient not taking:  Reported on 01/05/2022)  phenytoin (DILANTIN) 100 MG ER capsule Take 2 capsules (200 mg total) by mouth 2 (two) times daily May dispense any brand, may dispense regular with insurance does not cover ER 360 capsule 2  traMADoL (ULTRAM) 50 mg tablet Take 1 tablet by mouth every 6 (six) hours (Patient not taking: Reported on 11/16/2023)  traZODone (DESYREL) 100 MG tablet Take 100 mg by mouth once daily (Patient not taking: Reported on 11/16/2023)  zolpidem (AMBIEN) 5 MG tablet Take 5 mg by mouth at bedtime as needed for Sleep (Patient not taking: Reported on 11/16/2023)   Allergies: Penicillins Hives and Anaphylaxis   Review of Systems:  A comprehensive 14 point ROS was performed, reviewed by me today, and the pertinent orthopaedic findings are documented in the HPI.  Physical Exam: BP 128/68  Ht 162.6 cm (5\' 4" )  Wt (!) 101.2 kg (223 lb)  BMI 38.28 kg/m  General/Constitutional: The patient appears to be well-nourished, well-developed, and in no acute distress. Neuro/Psych: Normal mood and affect, oriented to person, place and time. Eyes: Non-icteric. Pupils are equal, round, and reactive to light, and exhibit synchronous movement. ENT: Unremarkable. Lymphatic: No palpable adenopathy. Respiratory: Lungs clear to auscultation, Normal chest excursion, No wheezes, and Non-labored breathing Cardiovascular: Regular rate and rhythm. No murmurs. and No edema, swelling or tenderness, except as noted in detailed exam. Integumentary: No impressive skin lesions present, except as noted in detailed exam. Musculoskeletal: Unremarkable, except as noted in detailed exam.  Left Knee Exam: The patient presents today with a family member. She is using a walker for assistance with ambulation today's visit. Skin examination of the left hip and leg do not reveal any open wound, erythema or ecchymosis. The patient is able to gently flex and extend at the lumbar spine with effort. Intact left and right bend with  rotation at today's visit. The patient does report moderate pain with palpation over the left greater trochanteric bursa, nontender palpation over the piriformis, sacroiliac joint or along the ischial tuberosity today. The patient does report increased pain with internal and external rotation of the left hip at today's visit. She does have increased pain with hip flexion and extension at today's visit as well the patient reports increased discomfort with palpation along the medial and lateral aspect of the left knee. She has moderate pain with even light touch of the left knee at today's visit. The patient is able to fully extend the knee and is able to flex close to 95 degrees at today's visit. The left knee is stable to varus and valgus stress testing. Moderate crepitus with range of motion activities. Negative Lachman's test. The patient does have a positive McMurray's test. She is able to dorsiflex and plantarflex the left foot without any pain. Negative Homans test. She is intact light touch on the left lower extremity.  Imaging: AP, lateral and sunrise views of the left knee were obtained today in the office and reviewed by me. Her x-rays do demonstrate a moderate varus knee with significant osteoarthritic changes. Patient has complete loss of the medial compartment with bone-on-bone appearance and osteophyte formation noted to the medial and lateral femoral condyles and medial and lateral tibial plateau. Underlying subchondral sclerosis noted throughout the knee.  Significant osteophyte formation off of the superior aspect of the left patella. Significant osteoarthritic changes noted to the right knee on today's x-ray as well.  Impression: Primary osteoarthritis of left knee.  Plan:  1. Treatment options were discussed today with the patient. 2. The patient is scheduled to undergo a left total knee arthroplasty with Dr. Joice Lofts on 02/22/2024. 3. The patient was instructed on the risk and benefits of  surgical intervention, after detailed discussion of the risk and benefits the patient would like to proceed with a left total knee arthroplasty. 4. MRI scan of the left hip did demonstrate moderate left hip osteoarthritic changes, we did discuss the possibility of a intra-articular steroid injection to help with her pain as she recovers from her left total knee arthroplasty. She does not wish to consider an injection at this time. 5. This document will serve as a surgical history and physical for the patient. 6. She will follow-up per send postop protocol. 7. They can call the clinic they have any questions, new symptoms develop or symptoms worsen.  The procedure was discussed with the patient, as were the potential risks (including bleeding, infection, nerve and/or blood vessel injury, persistent or recurrent pain, failure of the hardware, stiffness, amputation, need for further surgery, blood clots, strokes, heart attacks and/or arhythmias, pneumonia, etc.) and benefits. The patient states her understanding and wishes to proceed.    H&P reviewed and patient re-examined. No changes.

## 2024-02-22 NOTE — Evaluation (Signed)
 Physical Therapy Evaluation Patient Details Name: Kristen Hudson MRN: [id number] DOB: 1966-04-01 Today's Date: 02/22/2024  History of Present Illness  Kristen Hudson presents to [facility] on [facility]/22/24 for elective Left TKA. Pt previously AMB mostly household distances with 4WW, falls history.  Clinical Impression  Pt finished off bed pan on arrival, still in high transport gurney, agreeable to session. Pt has not been OOB yet, but pain is well controlled when not moving leg. Pt quickly notes that pain is far worse with mobility and very limiting in performance of all mobility. Pt is very slow on attempt to BR, hence we transport he rin recliner after covering 21ft in 2 minutes, however after voiding and hand hygiene, walking is somewhat better tolerated to 79ft with recliner follow. DTR reports pt struggled with mobility prior to admission, but not to this degree. Anticipate a ffew more opportunities to mobilize prior to DC would improve ability to make a safe DC. Encouraged pt to consider staying overnight and seeing PT again prior to leaving. OT eval would also be helpful before DC. Pt left in recliner at end of session, DTR at bedside, polar care running.       If plan is discharge home, recommend the following: A little help with walking and/or transfers;Assist for transportation;Assistance with cooking/housework;Help with stairs or ramp for entrance;Direct supervision/assist for medications management;Two people to help with bathing/dressing/bathroom   Can travel by private vehicle        Equipment Recommendations Rolling walker (2 wheels);BSC/3in1  Recommendations for Other Services       Functional Status Assessment Patient has had a recent decline in their functional status and demonstrates the ability to make significant improvements in function in a reasonable and predictable amount of time.     Precautions / Restrictions Precautions Precautions: Knee;Fall Precaution Booklet Issued: No Recall  of Precautions/Restrictions: Intact Restrictions Weight Bearing Restrictions Per Provider Order: Yes LLE Weight Bearing Per Provider Order: Weight bearing as tolerated      Mobility  Bed Mobility Overal bed mobility: Needs Assistance Bed Mobility: Supine to Sit     Supine to sit: Min assist     General bed mobility comments: DTR assists pt with 1 hand assist; pt struggles to move operative leg    Transfers Overall transfer level: Needs assistance Equipment used: Rolling walker (2 wheels) Transfers: Sit to/from Stand Sit to Stand: Supervision, Contact guard assist           General transfer comment: xfer from high gurney, from recliner, from elevated toilet; all with moderate effort, high pain levels, no physical assistance    Ambulation/Gait Ambulation/Gait assistance: Contact guard assist, Independent (chair follow) Gait Distance (Feet): 30 Feet Assistive device: Rolling walker (2 wheels) Gait Pattern/deviations: Step-to pattern Gait velocity: 0.27m/s     General Gait Details: segmented, lacks continuous movement due to pain, difficulty managing legs  Stairs            Wheelchair Mobility     Tilt Bed    Modified Rankin (Stroke Patients Only)       Balance                                             Pertinent Vitals/Pain Pain Assessment Pain Assessment: 0-10 Pain Score: 8  Pain Location: while AMB Pain Intervention(s): Limited activity within patient's tolerance, Monitored during session    Home Living  Family/patient expects to be discharged to:: Private residence (sister's home) Living Arrangements: Children;Other relatives Available Help at Discharge: Family Type of Home: House Home Access: Ramped entrance       Home Layout: One level Home Equipment: Rollator (4 wheels)      Prior Function Prior Level of Function : History of Falls (last six months);Driving             Mobility Comments: household distance  to limited community distances with 4WW ADLs Comments: supervision to modI     Extremity/Trunk Assessment                Communication        Cognition                                         Cueing       General Comments      Exercises     Assessment/Plan    PT Assessment Patient needs continued PT services  PT Problem List Decreased strength;Decreased range of motion;Decreased activity tolerance;Decreased balance;Decreased mobility;Decreased coordination       PT Treatment Interventions DME instruction;Gait training;Stair training;Functional mobility training;Therapeutic activities;Therapeutic exercise;Balance training;Neuromuscular re-education;Patient/family education    PT Goals (Current goals can be found in the Care Plan section)  Acute Rehab PT Goals Patient Stated Goal: wants to go home today PT Goal Formulation: With patient Time For Goal Achievement: 03/07/24 Potential to Achieve Goals: Fair    Frequency BID     Co-evaluation               AM-PAC PT "6 Clicks" Mobility  Outcome Measure Help needed turning from your back to your side while in a flat bed without using bedrails?: A Lot Help needed moving from lying on your back to sitting on the side of a flat bed without using bedrails?: A Lot Help needed moving to and from a bed to a chair (including a wheelchair)?: A Little Help needed standing up from a chair using your arms (e.g., wheelchair or bedside chair)?: A Little Help needed to walk in hospital room?: A Little Help needed climbing 3-5 steps with a railing? : A Lot 6 Click Score: 15    End of Session Equipment Utilized During Treatment: Gait belt Activity Tolerance: Patient tolerated treatment well;No increased pain;Patient limited by fatigue Patient left: in chair;with call bell/phone within reach;with family/visitor present;with nursing/sitter in room Nurse Communication: Mobility status PT Visit Diagnosis:  Difficulty in walking, not elsewhere classified (R26.2);Other abnormalities of gait and mobility (R26.89)    Time: 1355-1436 PT Time Calculation (min) (ACUTE ONLY): 41 min   Charges:   PT Evaluation $PT Eval Moderate Complexity: 1 Mod PT Treatments $Therapeutic Activity: 8-22 mins PT General Charges $$ ACUTE PT VISIT: 1 Visit    3:37 PM, 02/22/24 Rosamaria Lints, PT, DPT Physical Therapist - [facility]  [phone] (ASCOM)      Makenzie Weisner C 02/22/2024, 3:33 PM

## 2024-02-22 NOTE — Anesthesia Preprocedure Evaluation (Addendum)
 Anesthesia Evaluation  Patient identified by MRN, date of birth, ID band Patient awake    Reviewed: Allergy & Precautions, NPO status , Patient's Chart, lab work & pertinent test results  History of Anesthesia Complications Negative for: history of anesthetic complications  Airway Mallampati: III   Neck ROM: Full    Dental  (+) Edentulous Upper, Edentulous Lower   Pulmonary sleep apnea , Current Smoker (1-2 cigarettes per day)Patient did not abstain from smoking.   Pulmonary exam normal breath sounds clear to auscultation       Cardiovascular Normal cardiovascular exam Rhythm:Regular Rate:Normal  ECG 02/13/24: normal   Neuro/Psych Seizures - (last sz one year ago),  PSYCHIATRIC DISORDERS  Depression    TIA   GI/Hepatic ,GERD  ,,  Endo/Other  Obesity   Renal/GU negative Renal ROS     Musculoskeletal  (+) Arthritis ,  Fibromyalgia -  Abdominal   Peds  Hematology negative hematology ROS (+)   Anesthesia Other Findings   Reproductive/Obstetrics                             Anesthesia Physical Anesthesia Plan  ASA: 2  Anesthesia Plan: General   Post-op Pain Management:    Induction: Intravenous  PONV Risk Score and Plan: 2 and Treatment may vary due to age or medical condition, Ondansetron and Dexamethasone  Airway Management Planned: Oral ETT  Additional Equipment:   Intra-op Plan:   Post-operative Plan: Extubation in OR  Informed Consent: I have reviewed the patients History and Physical, chart, labs and discussed the procedure including the risks, benefits and alternatives for the proposed anesthesia with the patient or authorized representative who has indicated his/her understanding and acceptance.     Dental advisory given  Plan Discussed with: CRNA  Anesthesia Plan Comments: (Patient refuses spinal 2/2 history of back pain after epidural. Patient consented for risks of  anesthesia including but not limited to:  - adverse reactions to medications - damage to eyes, teeth, lips or other oral mucosa - nerve damage due to positioning  - sore throat or hoarseness - damage to heart, brain, nerves, lungs, other parts of body or loss of life  Informed patient about role of CRNA in peri- and intra-operative care.  Patient voiced understanding.)        Anesthesia Quick Evaluation

## 2024-02-22 NOTE — TOC Progression Note (Signed)
 Transition of Care [facility]) - Progression Note    Patient Details  Name: Kristen Hudson MRN: [id number] Date of Birth: 1966-05-15  [facility]) CM/SW Contact  Marlowe Sax, RN Phone Number: 02/22/2024, 1:12 PM  Clinical Narrative:     Adapt to bring RW and 3 in 1 to the bedside Set up with [facility] prior to Surgery by Surgeons office       Expected Discharge Plan and Services         Expected Discharge Date: 02/22/24                                     Social Determinants of Health (SDOH) Interventions SDOH Screenings   Food Insecurity: No Food Insecurity (02/07/2024)   Received from [facility]  Housing: Low Risk  (02/07/2024)   Received from [facility]  Transportation Needs: No Transportation Needs (02/07/2024)   Received from [facility]  Utilities: Not At Risk (02/07/2024)   Received from [facility]  Depression [phone]): Low Risk  (11/27/2021)  Financial Resource Strain: Low Risk  (02/07/2024)   Received from [facility]  Physical Activity: Inactive (11/27/2021)  Social Connections: Socially Isolated (11/27/2021)  Tobacco Use: High Risk (02/22/2024)    Readmission Risk Interventions     No data to display

## 2024-02-22 NOTE — Anesthesia Procedure Notes (Signed)
 Procedure Name: LMA Insertion Date/Time: 02/22/2024 7:35 AM  Performed by: Lily Lovings, CRNAPre-anesthesia Checklist: Patient identified, Patient being monitored, Timeout performed, Emergency Drugs available and Suction available Patient Re-evaluated:Patient Re-evaluated prior to induction Oxygen Delivery Method: Circle system utilized Preoxygenation: Pre-oxygenation with 100% oxygen Induction Type: IV induction Ventilation: Mask ventilation without difficulty LMA: LMA inserted LMA Size: 4.0 Tube type: Oral Number of attempts: 1 Placement Confirmation: positive ETCO2 and breath sounds checked- equal and bilateral Tube secured with: Tape Dental Injury: Teeth and Oropharynx as per pre-operative assessment

## 2024-02-22 NOTE — Progress Notes (Signed)
 Patient is not able to walk the distance required to go the bathroom, or he/she is unable to safely negotiate stairs required to access the bathroom.  A 3in1 BSC will alleviate this problem

## 2024-02-22 NOTE — Discharge Instructions (Addendum)
 Orthopedic discharge instructions: May shower with intact OpSite dressing. Apply ice frequently to knee or use Polar Care. Start Eliquis 1 tablet (2.5 mg) twice daily on Wednesday, 08/11/2022, for 6 weeks. Take pain medication as prescribed when needed.  May supplement with ES Tylenol if necessary. May weight-bear as tolerated on left leg - use walker for balance and support. Follow-up in 10-14 days or as scheduled.   POLAR CARE INFORMATION  MassAdvertisement.it  How to use Breg Polar Care [facility]?  YouTube   ShippingScam.co.uk  OPERATING INSTRUCTIONS  Start the product With dry hands, connect the transformer to the electrical connection located on the top of the cooler. Next, plug the transformer into an appropriate electrical outlet. The unit will automatically start running at this point.  To stop the pump, disconnect electrical power.  Unplug to stop the product when not in use. Unplugging the Polar Care unit turns it off. Always unplug immediately after use. Never leave it plugged in while unattended. Remove pad.    FIRST ADD WATER TO FILL LINE, THEN ICE---Replace ice when existing ice is almost melted  1 Discuss Treatment with your Licensed Health Care Practitioner and Use Only as Prescribed 2 Apply Insulation Barrier & Cold Therapy Pad 3 Check for Moisture 4 Inspect Skin Regularly  Tips and Trouble Shooting Usage Tips 1. Use cubed or chunked ice for optimal performance. 2. It is recommended to drain the Pad between uses. To drain the pad, hold the Pad upright with the hose pointed toward the ground. Depress the black plunger and allow water to drain out. 3. You may disconnect the Pad from the unit without removing the pad from the affected area by depressing the silver tabs on the hose coupling and gently pulling the hoses apart. The Pad and unit will seal itself and will not leak. Note: Some dripping during release is normal. 4. DO NOT  RUN PUMP WITHOUT WATER! The pump in this unit is designed to run with water. Running the unit without water will cause permanent damage to the pump. 5. Unplug unit before removing lid.  TROUBLESHOOTING GUIDE Pump not running, Water not flowing to the pad, Pad is not getting cold 1. Make sure the transformer is plugged into the wall outlet. 2. Confirm that the ice and water are filled to the indicated levels. 3. Make sure there are no kinks in the pad. 4. Gently pull on the blue tube to make sure the tube/pad junction is straight. 5. Remove the pad from the treatment site and ll it while the pad is lying at; then reapply. 6. Confirm that the pad couplings are securely attached to the unit. Listen for the double clicks (Figure 1) to confirm the pad couplings are securely attached.  Leaks    Note: Some condensation on the lines, controller, and pads is unavoidable, especially in warmer climates. 1. If using a Breg Polar Care Cold Therapy unit with a detachable Cold Therapy Pad, and a leak exists (other than condensation on the lines) disconnect the pad couplings. Make sure the silver tabs on the couplings are depressed before reconnecting the pad to the pump hose; then confirm both sides of the coupling are properly clicked in. 2. If the coupling continues to leak or a leak is detected in the pad itself, stop using it and call Breg Customer Care at [phone].  Cleaning After use, empty and dry the unit with a soft cloth. Warm water and mild detergent may be used occasionally  to clean the pump and tubes.  WARNING: The Polar Care Cube can be cold enough to cause serious injury, including full skin necrosis. Follow these Operating Instructions, and carefully read the Product Insert (see pouch on side of unit) and the Cold Therapy Pad Fitting Instructions (provided with each Cold Therapy Pad) prior to use.   Information for Discharge Teaching:  EXPAREL (bupivacaine liposome injectable suspension)    Pain relief is important to your recovery. The goal is to control your pain so you can move easier and return to your normal activities as soon as possible after your procedure. Your physician may use several types of medicines to manage pain, swelling, and more.  Your surgeon or anesthesiologist gave you EXPAREL(bupivacaine) to help control your pain after surgery.  EXPAREL is a local anesthetic designed to release slowly over an extended period of time to provide pain relief by numbing the tissue around the surgical site. EXPAREL is designed to release pain medication over time and can control pain for up to 72 hours. Depending on how you respond to EXPAREL, you may require less pain medication during your recovery. EXPAREL can help reduce or eliminate the need for opioids during the first few days after surgery when pain relief is needed the most. EXPAREL is not an opioid and is not addictive. It does not cause sleepiness or sedation.   Important! A teal colored band has been placed on your arm with the date, time and amount of EXPAREL you have received. Please leave this armband in place for the full 96 hours following administration, and then you may remove the band. If you return to the hospital for any reason within 96 hours following the administration of EXPAREL, the armband provides important information that your health care providers to know, and alerts them that you have received this anesthetic.    Possible side effects of EXPAREL: Temporary loss of sensation or ability to move in the area where medication was injected. Nausea, vomiting, constipation Rarely, numbness and tingling in your mouth or lips, lightheadedness, or anxiety may occur. Call your doctor right away if you think you may be experiencing any of these sensations, or if you have other questions regarding possible side effects.  Follow all other discharge instructions given to you by your surgeon or nurse. Eat a  healthy diet and drink plenty of water or other fluids.

## 2024-02-23 ENCOUNTER — Encounter: Payer: Self-pay | Admitting: Surgery

## 2024-02-23 DIAGNOSIS — M1712 Unilateral primary osteoarthritis, left knee: Secondary | ICD-10-CM | POA: Diagnosis not present

## 2024-02-23 DIAGNOSIS — M797 Fibromyalgia: Secondary | ICD-10-CM | POA: Diagnosis not present

## 2024-02-23 DIAGNOSIS — G4733 Obstructive sleep apnea (adult) (pediatric): Secondary | ICD-10-CM | POA: Diagnosis not present

## 2024-02-23 DIAGNOSIS — K219 Gastro-esophageal reflux disease without esophagitis: Secondary | ICD-10-CM | POA: Diagnosis not present

## 2024-02-23 DIAGNOSIS — M1612 Unilateral primary osteoarthritis, left hip: Secondary | ICD-10-CM | POA: Diagnosis not present

## 2024-02-23 DIAGNOSIS — Z8673 Personal history of transient ischemic attack (TIA), and cerebral infarction without residual deficits: Secondary | ICD-10-CM | POA: Diagnosis not present

## 2024-02-23 MED ORDER — APIXABAN 2.5 MG PO TABS
ORAL_TABLET | ORAL | Status: AC
Start: 2024-02-23 — End: ?
  Filled 2024-02-23: qty 1

## 2024-02-23 MED ORDER — ONDANSETRON HCL 4 MG PO TABS: 4.0000 mg | ORAL_TABLET | Freq: Four times a day (QID) | ORAL | 0 refills | Status: DC | PRN

## 2024-02-23 NOTE — Discharge Summary (Signed)
 Physician Discharge Summary  Patient ID: Rhona Fusilier MRN: [id number] DOB/AGE: Jun 15, 1966 58 y.o.  Admit date: 02/22/2024 Discharge date: 02/23/2024  Admission Diagnoses:  Status post total knee replacement using cement, left [Z96.652] Primary osteoarthritis of the left knee  Discharge Diagnoses: Patient Active Problem List   Diagnosis Date Noted   Status post total knee replacement using cement, left 02/22/2024   Encounter for screening colonoscopy 01/20/2023   Adenomatous polyp of colon 01/20/2023   Seizures (HCC) 09/22/2022   Renal colic on right side 07/21/2022   Acute pain of right knee 03/12/2021   Dysuria 10/24/2020   Chronic pelvic pain in female 08/24/2018   Status post bilateral salpingo-oophorectomy (BSO) 08/24/2018   History of 3 cesarean sections 08/24/2018   Vasomotor symptoms due to menopause 08/24/2018   BMI 33.0-33.9,adult 08/24/2018   Urinary urgency 08/24/2018    Past Medical History:  Diagnosis Date   Acid reflux    Colon polyp    Depression    Fibromyalgia    Obstructive sleep apnea    Osteoarthritis of left knee 01/2024   Seizures (HCC)    last seizure 07/2022   Stress incontinence    TIA (transient ischemic attack) 2013     Transfusion: None.   Consultants (if any):   Discharged Condition: Improved  Hospital Course: Tanyia Miotke is an 58 y.o. female who was admitted 02/22/2024 with a diagnosis of primary osteoarthritis of the left knee and went to the operating room on 02/22/2024 and underwent the above named procedures.    Surgeries: Procedure(s): TOTAL KNEE ARTHROPLASTY on 02/22/2024 Patient tolerated the surgery well. Taken to PACU where she was stabilized and then transferred to the post-op recovery area.  Started on Eliquis 2.5mg  every 12 hrs. Heels elevated on bed with rolled towels. No evidence of DVT. Negative Homan. Physical therapy started on day #0 for gait training and transfer. OT started day #0 for ADL and assisted  devices.  Patient's IV was removed on POD1.  Implants: Left TKA using all-cemented Zimmer Persona system with a #7 narrow PCR femur, a(n) E-sized  tibial tray with a 12 mm medial congruent E-poly insert, and a Vanguard 7.8 x 34 mm all-poly 3-pegged domed patella.   She was given perioperative antibiotics:  Anti-infectives (From admission, onward)    Start     Dose/Rate Route Frequency Ordered Stop   02/22/24 1330  ceFAZolin (ANCEF) IVPB 2g/100 mL premix        2 g 200 mL/hr over 30 Minutes Intravenous Every 6 hours 02/22/24 1233 02/22/24 2243   02/22/24 0730  ceFAZolin (ANCEF) IVPB 2g/100 mL premix        2 g 200 mL/hr over 30 Minutes Intravenous  Once 02/22/24 0715 02/22/24 0807     .  She was given sequential compression devices, early ambulation, and Eliquis for DVT prophylaxis.  She benefited maximally from the hospital stay and there were no complications.    Recent vital signs:  Vitals:   02/23/24 0349 02/23/24 0734  BP: 107/71 113/88  Pulse: 97 88  Resp: 18 18  Temp:  98.2 F (36.8 C)  SpO2: 96% 97%    Recent laboratory studies:  Lab Results  Component Value Date   HGB 13.9 02/16/2024   HGB 14.6 12/05/2021   HGB 13.1 11/05/2019   Lab Results  Component Value Date   WBC 11.9 (H) 02/16/2024   PLT 300 02/16/2024   No results found for: "INR" Lab Results  Component Value Date   NA  139 02/16/2024   K 3.8 02/16/2024   CL 105 02/16/2024   CO2 23 02/16/2024   BUN 22 (H) 02/16/2024   CREATININE 1.00 02/16/2024   GLUCOSE 77 02/16/2024    Discharge Medications:   Allergies as of 02/23/2024       Reactions   Penicillins Anaphylaxis   Did it involve swelling of the face/tongue/throat, SOB, or low BP? No Did it involve sudden or severe rash/hives, skin peeling, or any reaction on the inside of your mouth or nose? No Did you need to seek medical attention at a hospital or doctor's office? No When did it last happen?     teenager  If all above answers are  "NO", may proceed with cephalosporin use.        Medication List     STOP taking these medications    ibuprofen 200 MG tablet Commonly known as: ADVIL   meloxicam 15 MG tablet Commonly known as: MOBIC       TAKE these medications    apixaban 2.5 MG Tabs tablet Commonly known as: Eliquis Take 1 tablet (2.5 mg total) by mouth 2 (two) times daily.   gabapentin 300 MG capsule Commonly known as: NEURONTIN Take 300 mg by mouth 2 (two) times daily.   levETIRAcetam 750 MG tablet Commonly known as: Keppra Take 1 tablet (750 mg total) by mouth 2 (two) times daily.   ondansetron 4 MG tablet Commonly known as: ZOFRAN Take 1 tablet (4 mg total) by mouth every 6 (six) hours as needed for nausea.   oxybutynin 10 MG 24 hr tablet Commonly known as: DITROPAN-XL Take 1 tablet (10 mg total) by mouth at bedtime.   oxyCODONE 5 MG immediate release tablet Commonly known as: Roxicodone Take 1-2 tablets (5-10 mg total) by mouth every 4 (four) hours as needed for moderate pain (pain score 4-6) or severe pain (pain score 7-10).   traZODone 50 MG tablet Commonly known as: DESYREL Take 50 mg by mouth at bedtime.               Durable Medical Equipment  (From admission, onward)           Start     Ordered   02/22/24 1716  DME 3 n 1  Once        02/22/24 1715   02/22/24 1716  DME Walker rolling  Once       Question Answer Comment  Walker: With 5 Inch Wheels   Patient needs a walker to treat with the following condition Status post total knee replacement using cement, left      02/22/24 1715            Diagnostic Studies: DG Knee Left Port Result Date: 02/22/2024 CLINICAL DATA:  Status post total left knee arthroplasty using cement. EXAM: PORTABLE LEFT KNEE - 1-2 VIEW COMPARISON:  Left knee radiographs 11/07/2023 FINDINGS: Interval total left knee arthroplasty. No perihardware lucency is seen to indicate hardware failure or loosening. Expected postoperative changes  including intra-articular and subcutaneous air. Mild-to-moderatejoint effusion. Anterior surgical skin staples. No acute fracture or dislocation. IMPRESSION: Interval total left knee arthroplasty without evidence of hardware failure. Electronically Signed   By: Neita Garnet M.D.   On: 02/22/2024 13:03   Disposition: Plan for discharge home pending progress with PT.   Follow-up Information     Poggi, Excell Seltzer, MD. Go on 03/08/2024.   Specialty: Orthopedic Surgery Why: at 3:00pm for post operative follow up and Physical Therapy Contact  information: [address] [facility] [address] [facility] [place] [address] [phone]                Signed: Meriel Pica PA-C 02/23/2024, 7:46 AM

## 2024-02-23 NOTE — Progress Notes (Signed)
 Discharge Summary for Kristen Hudson:   Discharge Plan:  Patient will be discharged home as per the MD's order. We discussed prescriptions and follow-up appointments with the patient. The prescriptions were provided, and the medication list was explained in detail. Patient confirmed understanding of the instructions.  Skin Assessment: The patient's skin is clean, dry, and intact, with no signs of breakdown or tears. The IV catheter was removed, and the skin remains intact. The site shows no signs or symptoms of complications. A dressing and pressure were applied to the site. Patient reports no pain and has no complaints.  After-Visit Summary: An After-Visit Summary was printed and given to the patient. The following items were sent home with patient:  3-in-1 Bedside commode Rolling walker Polar care One honeycomb bandage Two TED hose placed on patient's lower legs   The patient was escorted via wheelchair and discharged home in a private vehicle.  Rhilynn Preyer D. Lawerance Bach, RN

## 2024-02-23 NOTE — Evaluation (Signed)
 Occupational Therapy Evaluation Patient Details Name: Kristen Hudson MRN: [id number] DOB: 1966/12/02 Today's Date: 02/23/2024   History of Present Illness   Pt is s/p L TKA on 2/25. PMH significant for anxiety, depression, BPPV, GERD, TIA, seizures, & fibromyalgia       Clinical Impressions Pt seen for OT evaluation this date, s/p L TKA. Pt was independent in all ADLs prior to surgery, however occasionally using 4WW  for mobility due to L knee pain. Pt is eager to return to PLOF with less pain and improved safety and independence. Pt currently requires minimal assist for LB dressing while in seated position due to pain and limited AROM of L knee. Pt instructed in polar care mgt, falls prevention strategies, home/routines modifications, DME/AE for LB bathing and dressing tasks, and compression stocking mgt. Pt completed UB/LB dressing, minA for donning sock on surgical leg, maxA to don TED hose, and supervision for donning underwear from sit<>stand, toileting tasks supervision with RW, using BSC over commode, stands at sink to wash hands supervision and functional mobility with supervision ~200 ft in hallway using RW. Do not currently anticipate any OT needs following this hospitalization.  Pt safe to discharge home with supervision from family.       If plan is discharge home, recommend the following:   A little help with walking and/or transfers;A little help with bathing/dressing/bathroom;Assist for transportation;Assistance with cooking/housework;Help with stairs or ramp for entrance     Functional Status Assessment   Patient has had a recent decline in their functional status and demonstrates the ability to make significant improvements in function in a reasonable and predictable amount of time.     Equipment Recommendations   BSC/3in1      Precautions/Restrictions   Precautions Precautions: Knee;Fall Precaution Booklet Issued: No Recall of Precautions/Restrictions:  Intact Restrictions Weight Bearing Restrictions Per Provider Order: Yes LLE Weight Bearing Per Provider Order: Weight bearing as tolerated     Mobility Bed Mobility               General bed mobility comments: In recliner start and end of session    Transfers Overall transfer level: Needs assistance Equipment used: Rolling walker (2 wheels) Transfers: Sit to/from Stand Sit to Stand: Supervision           General transfer comment: pt able to stand with supervision; good weight shifting and balance      Balance Overall balance assessment: Needs assistance Sitting-balance support: Feet supported Sitting balance-Leahy Scale: Good Sitting balance - Comments: no LOB with performance of UB/LB dressing recliner level   Standing balance support: Bilateral upper extremity supported Standing balance-Leahy Scale: Good Standing balance comment: steady during standing ADLs at sink                           ADL either performed or assessed with clinical judgement   ADL Overall ADL's : Needs assistance/impaired     Grooming: Wash/dry hands;Standing;Supervision/safety Grooming Details (indicate cue type and reason): sink level         Upper Body Dressing : Sitting;Independent   Lower Body Dressing: Minimal assistance;Sit to/from stand Lower Body Dressing Details (indicate cue type and reason): minA to don sock on surgical leg, maxA to don TED hose seated in recliner, pt dons underwear from sit<>stand position with supervision Toilet Transfer: Supervision/safety;BSC/3in1;Rolling walker (2 wheels) Toilet Transfer Details (indicate cue type and reason): BSC over commode, pt recived coming out of bathroom with distant supervision  from RN Toileting- Architect and Hygiene: Supervision/safety;Sit to/from stand Toileting - Clothing Manipulation Details (indicate cue type and reason): distant supervision (pt recieved coming out of bathroom with RN in room)      Functional mobility during ADLs: Supervision/safety;Rolling walker (2 wheels) General ADL Comments: Generally good balance and technique with     Vision Baseline Vision/History: 1 Wears glasses Vision Assessment?: Wears glasses for reading            Pertinent Vitals/Pain Pain Assessment Pain Assessment: 0-10 Pain Score: 4  Pain Location: L knee Pain Intervention(s): Limited activity within patient's tolerance, Monitored during session     Extremity/Trunk Assessment Upper Extremity Assessment Upper Extremity Assessment: Left hand dominant;Overall [facility] for tasks assessed   Lower Extremity Assessment Lower Extremity Assessment: Defer to PT evaluation;LLE deficits/detail   Cervical / Trunk Assessment Cervical / Trunk Assessment: Normal   Communication Communication Communication: No apparent difficulties   Cognition Arousal: Alert Behavior During Therapy: WFL for tasks assessed/performed Cognition: No apparent impairments                               Following commands: Intact       Cueing  General Comments   Cueing Techniques: Verbal cues              Home Living Family/patient expects to be discharged to:: Private residence Living Arrangements: Children Available Help at Discharge: Family Type of Home: House Home Access: Ramped entrance     Home Layout: One level     Bathroom Shower/Tub: Chief Strategy Officer: Standard Bathroom Accessibility: Yes How Accessible: Accessible via walker Home Equipment: Rollator (4 wheels);Shower seat          Prior Functioning/Environment Prior Level of Function : History of Falls (last six months);Driving             Mobility Comments: household distance to limited community distances with 4WW ADLs Comments: supervision to modI    OT Problem List: Decreased strength;Decreased activity tolerance;Decreased range of motion;Impaired balance (sitting and/or  standing);Pain;Decreased knowledge of precautions;Decreased knowledge of use of DME or AE   OT Treatment/Interventions:        OT Goals(Current goals can be found in the care plan section)   Acute Rehab OT Goals OT Goal Formulation: All assessment and education complete, DC therapy Potential to Achieve Goals: Good   AM-PAC OT "6 Clicks" Daily Activity     Outcome Measure Help from another person eating meals?: None Help from another person taking care of personal grooming?: None Help from another person toileting, which includes using toliet, bedpan, or urinal?: A Little Help from another person bathing (including washing, rinsing, drying)?: A Little Help from another person to put on and taking off regular upper body clothing?: None Help from another person to put on and taking off regular lower body clothing?: A Little 6 Click Score: 21   End of Session Equipment Utilized During Treatment: Rolling walker (2 wheels);Gait belt Nurse Communication: Mobility status (ADL edu complete)  Activity Tolerance: Patient tolerated treatment well Patient left: in chair;with call bell/phone within reach;with nursing/sitter in room  OT Visit Diagnosis: Other abnormalities of gait and mobility (R26.89);Unsteadiness on feet (R26.81);History of falling (Z91.81);Pain Pain - Right/Left: Left Pain - part of body: Knee;Leg                Time: 1610-9604 OT Time Calculation (min): 45 min Charges:  OT General  Charges $OT Visit: 1 Visit OT Evaluation $OT Eval Moderate Complexity: 1 Mod OT Treatments $Self Care/Home Management : 23-37 mins Shequita Peplinski L. Anesia Blackwell, OTR/L  02/23/24, 9:28 AM

## 2024-02-23 NOTE — Progress Notes (Signed)
 Physical Therapy Treatment Patient Details Name: Kristen Hudson MRN: [id number] DOB: October 21, 1966 Today's Date: 02/23/2024   History of Present Illness Kristen Hudson presents to [facility] on 02/22/24 for elective Left TKA. Pt previously AMB mostly household distances with 4WW, falls history.    PT Comments  Pt was sitting in recliner upon arrival. She is A and O x 4 and motivated to improve. Pt demonstrated safe abilities to stand and ambulate with use of RW. Safely performed stairs with cueing prior to returning to room for HEP. Pt tolerated stretches well. Reviewed car transfers and expectations of HHPT. Pt is cleared form ana acute standpoint for safe DC home with HHPT to follow.     If plan is discharge home, recommend the following: Direct supervision/assist for medications management;Assistance with cooking/housework;Assist for transportation;Help with stairs or ramp for entrance     Equipment Recommendations  Other (comment);None recommended by PT (pt has personal DME already delivered to her room)       Precautions / Restrictions Precautions Precautions: Knee;Fall Precaution Booklet Issued: Yes (comment) Recall of Precautions/Restrictions: Intact Restrictions Weight Bearing Restrictions Per Provider Order: Yes LLE Weight Bearing Per Provider Order: Weight bearing as tolerated     Mobility  Bed Mobility  General bed mobility comments: IN recliner pre/post session    Transfers Overall transfer level: Needs assistance Equipment used: Rolling walker (2 wheels) Transfers: Sit to/from Stand Sit to Stand: Supervision   Ambulation/Gait Ambulation/Gait assistance: Supervision Gait Distance (Feet): 200 Feet Assistive device: Rolling walker (2 wheels) Gait Pattern/deviations: Step-through pattern  General Gait Details: no LOB or safety concerns during ambulation with RW   Stairs Stairs: Yes Stairs assistance: Supervision Stair Management: Two rails, Step to pattern, Forwards Number of  Stairs: 4 General stair comments: pt ambulated up/down 4 stairs. planning to go to sisters house who has a ramp    Balance Overall balance assessment: Needs assistance Sitting-balance support: Feet supported Sitting balance-Leahy Scale: Good     Standing balance support: Bilateral upper extremity supported, During functional activity, Reliant on assistive device for balance Standing balance-Leahy Scale: Good         Cognition Arousal: Alert Behavior During Therapy: WFL for tasks assessed/performed   PT - Cognitive impairments: No apparent impairments             General Comments General comments (skin integrity, edema, etc.): pt performed stretching exercises and reviewed car transfers      Pertinent Vitals/Pain Pain Assessment Pain Assessment: 0-10 Pain Score: 4  Pain Location: knee Pain Descriptors / Indicators: Discomfort Pain Intervention(s): Limited activity within patient's tolerance, Monitored during session, Patient requesting pain meds-RN notified, Repositioned, Ice applied     PT Goals (current goals can now be found in the care plan section) Acute Rehab PT Goals Patient Stated Goal: go home Progress towards PT goals: Progressing toward goals    Frequency    BID       AM-PAC PT "6 Clicks" Mobility   Outcome Measure  Help needed turning from your back to your side while in a flat bed without using bedrails?: A Little Help needed moving from lying on your back to sitting on the side of a flat bed without using bedrails?: A Little Help needed moving to and from a bed to a chair (including a wheelchair)?: A Little Help needed standing up from a chair using your arms (e.g., wheelchair or bedside chair)?: A Little Help needed to walk in hospital room?: A Little Help needed climbing 3-5 steps with  a railing? : A Little 6 Click Score: 18    End of Session   Activity Tolerance: Patient tolerated treatment well Patient left: in chair;with call bell/phone  within reach;with family/visitor present;with nursing/sitter in room Nurse Communication: Mobility status PT Visit Diagnosis: Difficulty in walking, not elsewhere classified (R26.2);Other abnormalities of gait and mobility (R26.89)     Time: 4098-1191 PT Time Calculation (min) (ACUTE ONLY): 24 min  Charges:    $Gait Training: 8-22 mins $Therapeutic Exercise: 8-22 mins PT General Charges $$ ACUTE PT VISIT: 1 Visit                     Jetta Lout PTA 02/23/24, 8:48 AM

## 2024-02-23 NOTE — Progress Notes (Signed)
  Subjective: 1 Day Post-Op Procedure(s) (LRB): TOTAL KNEE ARTHROPLASTY (Left) Patient reports pain as mild.   Patient is well, and has had no acute complaints or problems Plan is to go Home after hospital stay. Negative for chest pain and shortness of breath Fever: no Gastrointestinal:Negative for nausea and vomiting  Objective: Vital signs in last 24 hours: Temp:  [97.6 F (36.4 C)-99.3 F (37.4 C)] 98.2 F (36.8 C) (02/26 0734) Pulse Rate:  [74-97] 88 (02/26 0734) Resp:  [13-19] 18 (02/26 0734) BP: (107-143)/(71-99) 113/88 (02/26 0734) SpO2:  [93 %-98 %] 97 % (02/26 0734)  Intake/Output from previous day:  Intake/Output Summary (Last 24 hours) at 02/23/2024 0744 Last data filed at 02/23/2024 0300 Gross per 24 hour  Intake 1781.08 ml  Output 20 ml  Net 1761.08 ml    Intake/Output this shift: No intake/output data recorded.  Labs: No results for input(s): "HGB" in the last 72 hours. No results for input(s): "WBC", "RBC", "HCT", "PLT" in the last 72 hours. No results for input(s): "NA", "K", "CL", "CO2", "BUN", "CREATININE", "GLUCOSE", "CALCIUM" in the last 72 hours. No results for input(s): "LABPT", "INR" in the last 72 hours.   EXAM General - Patient is Alert, Appropriate, and Oriented Extremity - ABD soft Neurovascular intact Dorsiflexion/Plantar flexion intact Incision: dressing C/D/I No cellulitis present Compartment soft Dressing/Incision - clean, dry, no drainage noted to the left knee honeycomb dressing. Motor Function - intact, moving foot and toes well on exam.  Abdomen soft with intact bowel sounds.  Past Medical History:  Diagnosis Date   Acid reflux    Colon polyp    Depression    Fibromyalgia    Obstructive sleep apnea    Osteoarthritis of left knee 01/2024   Seizures (HCC)    last seizure 07/2022   Stress incontinence    TIA (transient ischemic attack) 2013    Assessment/Plan: 1 Day Post-Op Procedure(s) (LRB): TOTAL KNEE ARTHROPLASTY  (Left) Principal Problem:   Status post total knee replacement using cement, left  Estimated body mass index is 38.97 kg/m as calculated from the following:   Height as of this encounter: 5\' 3"  (1.6 m).   Weight as of this encounter: 99.8 kg. Advance diet Up with therapy D/C IV fluids when tolerating po intake.  Vitals reviewed this AM. Up with therapy. Continue to work on a BM. Plan for discharge home today pending progress with PT.  DVT Prophylaxis - TED hose and Eliquis Weight-Bearing as tolerated to left leg  J. Horris Latino, PA-C [facility] Orthopaedic Surgery 02/23/2024, 7:44 AM

## 2024-02-23 NOTE — Plan of Care (Signed)
  Problem: Education: Goal: Knowledge of General Education information will improve Description: Including pain rating scale, medication(s)/side effects and non-pharmacologic comfort measures Outcome: Progressing   Problem: Health Behavior/Discharge Planning: Goal: Ability to manage health-related needs will improve Outcome: Progressing   Problem: Clinical Measurements: Goal: Will remain free from infection Outcome: Progressing   Problem: Activity: Goal: Risk for activity intolerance will decrease Outcome: Progressing   Problem: Nutrition: Goal: Adequate nutrition will be maintained Outcome: Progressing   Problem: Coping: Goal: Level of anxiety will decrease Outcome: Progressing   Problem: Pain Managment: Goal: General experience of comfort will improve and/or be controlled Outcome: Progressing   Problem: Safety: Goal: Ability to remain free from injury will improve Outcome: Progressing   Problem: Skin Integrity: Goal: Risk for impaired skin integrity will decrease Outcome: Progressing

## 2024-02-23 NOTE — Plan of Care (Signed)
  Problem: Activity: Goal: Risk for activity intolerance will decrease Outcome: Progressing   Problem: Coping: Goal: Level of anxiety will decrease Outcome: Progressing   Problem: Elimination: Goal: Will not experience complications related to urinary retention Outcome: Progressing   Problem: Pain Managment: Goal: General experience of comfort will improve and/or be controlled Outcome: Progressing   Problem: Activity: Goal: Ability to avoid complications of mobility impairment will improve Outcome: Progressing   Problem: Pain Management: Goal: Pain level will decrease with appropriate interventions Outcome: Progressing

## 2024-02-24 DIAGNOSIS — Z9181 History of falling: Secondary | ICD-10-CM | POA: Diagnosis not present

## 2024-02-24 DIAGNOSIS — M797 Fibromyalgia: Secondary | ICD-10-CM | POA: Diagnosis not present

## 2024-02-24 DIAGNOSIS — Z8673 Personal history of transient ischemic attack (TIA), and cerebral infarction without residual deficits: Secondary | ICD-10-CM | POA: Diagnosis not present

## 2024-02-24 DIAGNOSIS — R911 Solitary pulmonary nodule: Secondary | ICD-10-CM | POA: Diagnosis not present

## 2024-02-24 DIAGNOSIS — I10 Essential (primary) hypertension: Secondary | ICD-10-CM | POA: Diagnosis not present

## 2024-02-24 DIAGNOSIS — G4733 Obstructive sleep apnea (adult) (pediatric): Secondary | ICD-10-CM | POA: Diagnosis not present

## 2024-02-24 DIAGNOSIS — Z860101 Personal history of adenomatous and serrated colon polyps: Secondary | ICD-10-CM | POA: Diagnosis not present

## 2024-02-24 DIAGNOSIS — Z471 Aftercare following joint replacement surgery: Secondary | ICD-10-CM | POA: Diagnosis not present

## 2024-02-24 DIAGNOSIS — K219 Gastro-esophageal reflux disease without esophagitis: Secondary | ICD-10-CM | POA: Diagnosis not present

## 2024-02-24 DIAGNOSIS — H8110 Benign paroxysmal vertigo, unspecified ear: Secondary | ICD-10-CM | POA: Diagnosis not present

## 2024-02-24 DIAGNOSIS — Z7901 Long term (current) use of anticoagulants: Secondary | ICD-10-CM | POA: Diagnosis not present

## 2024-02-24 DIAGNOSIS — Z96652 Presence of left artificial knee joint: Secondary | ICD-10-CM | POA: Diagnosis not present

## 2024-02-24 DIAGNOSIS — G8929 Other chronic pain: Secondary | ICD-10-CM | POA: Diagnosis not present

## 2024-03-20 ENCOUNTER — Telehealth: Payer: Self-pay

## 2024-03-20 NOTE — Telephone Encounter (Signed)
 Declines LCS program at this time. Request call back in one month.

## 2024-06-20 ENCOUNTER — Other Ambulatory Visit: Payer: Self-pay | Admitting: Surgery

## 2024-06-26 ENCOUNTER — Other Ambulatory Visit: Payer: Self-pay

## 2024-06-26 ENCOUNTER — Encounter
Admission: RE | Admit: 2024-06-26 | Discharge: 2024-06-26 | Disposition: A | Discharge status: Home / Self Care | Source: Ambulatory Visit | Attending: Surgery | Admitting: Surgery

## 2024-06-26 VITALS — BP 139/84 | HR 73 | Resp 14 | Ht 63.0 in | Wt 205.8 lb

## 2024-06-26 DIAGNOSIS — Z01818 Encounter for other preprocedural examination: Secondary | ICD-10-CM

## 2024-06-26 DIAGNOSIS — R569 Unspecified convulsions: Secondary | ICD-10-CM | POA: Diagnosis not present

## 2024-06-26 DIAGNOSIS — Z01812 Encounter for preprocedural laboratory examination: Secondary | ICD-10-CM | POA: Diagnosis present

## 2024-06-26 HISTORY — DX: Tobacco use: Z72.0

## 2024-06-26 HISTORY — DX: Personal history of urinary calculi: Z87.442

## 2024-06-26 HISTORY — DX: Unilateral primary osteoarthritis, right knee: M17.11

## 2024-06-26 LAB — URINALYSIS, ROUTINE W REFLEX MICROSCOPIC
Bilirubin Urine: NEGATIVE
Glucose, UA: NEGATIVE mg/dL
Hgb urine dipstick: NEGATIVE
Ketones, ur: NEGATIVE mg/dL
Nitrite: NEGATIVE
Protein, ur: NEGATIVE mg/dL
Specific Gravity, Urine: 1.01 (ref 1.005–1.030)
pH: 5 (ref 5.0–8.0)

## 2024-06-26 LAB — COMPREHENSIVE METABOLIC PANEL WITH GFR
ALT: 17 U/L (ref 0–44)
AST: 19 U/L (ref 15–41)
Albumin: 4.2 g/dL (ref 3.5–5.0)
Alkaline Phosphatase: 63 U/L (ref 38–126)
Anion gap: 8 (ref 5–15)
BUN: 25 mg/dL — ABNORMAL HIGH (ref 6–20)
CO2: 22 mmol/L (ref 22–32)
Calcium: 9 mg/dL (ref 8.9–10.3)
Chloride: 109 mmol/L (ref 98–111)
Creatinine, Ser: 1.02 mg/dL — ABNORMAL HIGH (ref 0.44–1.00)
GFR, Estimated: >60 mL/min (ref >60)
Glucose, Bld: 95 mg/dL (ref 70–99)
Potassium: 4.2 mmol/L (ref 3.5–5.1)
Sodium: 139 mmol/L (ref 135–145)
Total Bilirubin: 0.6 mg/dL (ref 0.0–1.2)
Total Protein: 6.9 g/dL (ref 6.5–8.1)

## 2024-06-26 LAB — CBC WITH DIFFERENTIAL/PLATELET
Abs Immature Granulocytes: 0.03 10*3/uL (ref 0.00–0.07)
Basophils Absolute: 0.1 10*3/uL (ref 0.0–0.1)
Basophils Relative: 1 %
Eosinophils Absolute: 0.2 10*3/uL (ref 0.0–0.5)
Eosinophils Relative: 3 %
HCT: 39.1 % (ref 36.0–46.0)
Hemoglobin: 13 g/dL (ref 12.0–15.0)
Immature Granulocytes: 0 %
Lymphocytes Relative: 31 %
Lymphs Abs: 2.6 10*3/uL (ref 0.7–4.0)
MCH: 30 pg (ref 26.0–34.0)
MCHC: 33.2 g/dL (ref 30.0–36.0)
MCV: 90.1 fL (ref 80.0–100.0)
Monocytes Absolute: 0.5 10*3/uL (ref 0.1–1.0)
Monocytes Relative: 6 %
Neutro Abs: 5 10*3/uL (ref 1.7–7.7)
Neutrophils Relative %: 59 %
Platelets: 300 10*3/uL (ref 150–400)
RBC: 4.34 MIL/uL (ref 3.87–5.11)
RDW: 12.2 % (ref 11.5–15.5)
WBC: 8.4 10*3/uL (ref 4.0–10.5)
nRBC: 0 % (ref 0.0–0.2)

## 2024-06-26 LAB — SURGICAL PCR SCREEN
MRSA, PCR: NEGATIVE
Staphylococcus aureus: NEGATIVE

## 2024-06-26 NOTE — Patient Instructions (Addendum)
 Your procedure is scheduled on:07-04-24 Tuesday Report to the Registration Desk on the 1st floor of the [facility].Then proceed to the 2nd floor Surgery Desk To find out your arrival time, please call [phone] between 1PM - 3PM on:07-03-24 Monday If your arrival time is 6:00 am, do not arrive before that time as the [facility] entrance doors do not open until 6:00 am.  REMEMBER: Instructions that are not followed completely may result in serious medical risk, up to and including death; or upon the discretion of your surgeon and anesthesiologist your surgery may need to be rescheduled.  Do not eat food after midnight the night before surgery.  No gum chewing or hard candies.  You may however, drink CLEAR liquids up to 2 hours before you are scheduled to arrive for your surgery. Do not drink anything within 2 hours of your scheduled arrival time.  Clear liquids include: - water  - apple juice without pulp - gatorade (not RED colors) - black coffee or tea (Do NOT add milk or creamers to the coffee or tea) Do NOT drink anything that is not on this list.  In addition, your doctor has ordered for you to drink the provided:  Ensure Pre-Surgery Clear Carbohydrate Drink  Drinking this carbohydrate drink up to two hours before surgery helps to reduce insulin resistance and improve patient outcomes. Please complete drinking 2 hours before scheduled arrival time.  One week prior to surgery:Stop NOW (06-26-24) Stop Anti-inflammatories (NSAIDS) such as meloxicam  (MOBIC ), Advil , Aleve, Ibuprofen , Motrin , Naproxen, Naprosyn and Aspirin based products such as Excedrin, Goody's Powder, BC Powder. You can continue celecoxib (CELEBREX) up until the day prior to surgery Stop ANY OVER THE COUNTER supplements until after surgery.  You may however, continue to take Tylenol  if needed for pain up until the day of surgery.  Continue taking all of your other prescription medications up until the day of  surgery.  ON THE DAY OF SURGERY ONLY TAKE THESE MEDICATIONS WITH SIPS OF WATER: -levETIRAcetam  (KEPPRA )   No Alcohol for 24 hours before or after surgery.  No Smoking including e-cigarettes for 24 hours before surgery.  No chewable tobacco products for at least 6 hours before surgery.  No nicotine patches on the day of surgery.  Do not use any recreational drugs for at least a week (preferably 2 weeks) before your surgery.  Please be advised that the combination of cocaine and anesthesia may have negative outcomes, up to and including death. If you test positive for cocaine, your surgery will be cancelled.  On the morning of surgery brush your teeth with toothpaste and water, you may rinse your mouth with mouthwash if you wish. Do not swallow any toothpaste or mouthwash.  Use CHG Soap as directed on instruction sheet.  Do not wear jewelry, make-up, hairpins, clips or nail polish.  For welded (permanent) jewelry: bracelets, anklets, waist bands, etc.  Please have this removed prior to surgery.  If it is not removed, there is a chance that hospital personnel will need to cut it off on the day of surgery.  Do not wear lotions, powders, or perfumes.   Do not shave body hair from the neck down 48 hours before surgery.  Contact lenses, hearing aids and dentures may not be worn into surgery.  Do not bring valuables to the hospital. [facility] is not responsible for any missing/lost belongings or valuables.   Notify your doctor if there is any change in your medical condition (cold, fever,  infection).  Wear comfortable clothing (specific to your surgery type) to the hospital.  After surgery, you can help prevent lung complications by doing breathing exercises.  Take deep breaths and cough every 1-2 hours. Your doctor may order a device called an Incentive Spirometer to help you take deep breaths. When coughing or sneezing, hold a pillow firmly against your incision with both hands.  This is called "splinting." Doing this helps protect your incision. It also decreases belly discomfort.  If you are being admitted to the hospital overnight, leave your suitcase in the car. After surgery it may be brought to your room.  In case of increased patient census, it may be necessary for you, the patient, to continue your postoperative care in the Same Day Surgery department.  If you are being discharged the day of surgery, you will not be allowed to drive home. You will need a responsible individual to drive you home and stay with you for 24 hours after surgery.   If you are taking public transportation, you will need to have a responsible individual with you.  Please call the Pre-admissions Testing Dept. at [phone] if you have any questions about these instructions.  Surgery Visitation Policy:  Patients having surgery or a procedure may have two visitors.  Children under the age of 45 must have an adult with them who is not the patient.  Inpatient Visitation:    Visiting hours are 7 a.m. to 8 p.m. Up to four visitors are allowed at one time in a patient room. The visitors may rotate out with other people during the day.  One visitor age 62 or older may stay with the patient overnight and must be in the room by 8 p.m.    Pre-operative 5 CHG Bath Instructions   You can play a key role in reducing the risk of infection after surgery. Your skin needs to be as free of germs as possible. You can reduce the number of germs on your skin by washing with CHG (chlorhexidine  gluconate) soap before surgery. CHG is an antiseptic soap that kills germs and continues to kill germs even after washing.   DO NOT use if you have an allergy to chlorhexidine /CHG or antibacterial soaps. If your skin becomes reddened or irritated, stop using the CHG and notify one of our RNs at [phone].   Please shower with the CHG soap starting 4 days before surgery using the following schedule:      Please keep in mind the following:  DO NOT shave, including legs and underarms, starting the day of your first shower.   You may shave your face at any point before/day of surgery.  Place clean sheets on your bed the day you start using CHG soap. Use a clean washcloth (not used since being washed) for each shower. DO NOT sleep with pets once you start using the CHG.   CHG Shower Instructions:  If you choose to wash your hair and private area, wash first with your normal shampoo/soap.  After you use shampoo/soap, rinse your hair and body thoroughly to remove shampoo/soap residue.  Turn the water OFF and apply about 3 tablespoons (45 ml) of CHG soap to a CLEAN washcloth.  Apply CHG soap ONLY FROM YOUR NECK DOWN TO YOUR TOES (washing for 3-5 minutes)  DO NOT use CHG soap on face, private areas, open wounds, or sores.  Pay special attention to the area where your surgery is being performed.  If you are having back  surgery, having someone wash your back for you may be helpful. Wait 2 minutes after CHG soap is applied, then you may rinse off the CHG soap.  Pat dry with a clean towel  Put on clean clothes/pajamas   If you choose to wear lotion, please use ONLY the CHG-compatible lotions on the back of this paper.     Additional instructions for the day of surgery: DO NOT APPLY any lotions, deodorants, cologne, or perfumes.   Put on clean/comfortable clothes.  Brush your teeth.  Ask your nurse before applying any prescription medications to the skin.      CHG Compatible Lotions   Aveeno Moisturizing lotion  Cetaphil Moisturizing Cream  Cetaphil Moisturizing Lotion  Clairol Herbal Essence Moisturizing Lotion, Dry Skin  Clairol Herbal Essence Moisturizing Lotion, Extra Dry Skin  Clairol Herbal Essence Moisturizing Lotion, Normal Skin  Curel Age Defying Therapeutic Moisturizing Lotion with Alpha Hydroxy  Curel Extreme Care Body Lotion  Curel Soothing Hands Moisturizing Hand  Lotion  Curel Therapeutic Moisturizing Cream, Fragrance-Free  Curel Therapeutic Moisturizing Lotion, Fragrance-Free  Curel Therapeutic Moisturizing Lotion, Original Formula  Eucerin Daily Replenishing Lotion  Eucerin Dry Skin Therapy Plus Alpha Hydroxy Crme  Eucerin Dry Skin Therapy Plus Alpha Hydroxy Lotion  Eucerin Original Crme  Eucerin Original Lotion  Eucerin Plus Crme Eucerin Plus Lotion  Eucerin TriLipid Replenishing Lotion  Keri Anti-Bacterial Hand Lotion  Keri Deep Conditioning Original Lotion Dry Skin Formula Softly Scented  Keri Deep Conditioning Original Lotion, Fragrance Free Sensitive Skin Formula  Keri Lotion Fast Absorbing Fragrance Free Sensitive Skin Formula  Keri Lotion Fast Absorbing Softly Scented Dry Skin Formula  Keri Original Lotion  Keri Skin Renewal Lotion Keri Silky Smooth Lotion  Keri Silky Smooth Sensitive Skin Lotion  Nivea Body Creamy Conditioning Oil  Nivea Body Extra Enriched Lotion  Nivea Body Original Lotion  Nivea Body Sheer Moisturizing Lotion Nivea Crme  Nivea Skin Firming Lotion  NutraDerm 30 Skin Lotion  NutraDerm Skin Lotion  NutraDerm Therapeutic Skin Cream  NutraDerm Therapeutic Skin Lotion  ProShield Protective Hand Cream  Provon moisturizing lotion  Preoperative Educational Videos for Total Hip, Knee and Shoulder Replacements  To better prepare for surgery, please view our videos that explain the physical activity and discharge planning required to have the best surgical recovery at [facility].  [email or web address]  Questions? Call [phone] or email [email or web address]        Community Resource Directory to address health-related social needs:  [email or web address]

## 2024-07-03 ENCOUNTER — Ambulatory Visit: Payer: 59 | Admitting: Neurology

## 2024-07-04 ENCOUNTER — Other Ambulatory Visit: Payer: Self-pay

## 2024-07-04 ENCOUNTER — Encounter
Admission: RE | Disposition: A | Discharge status: Home / Self Care | Payer: Self-pay | Source: Home / Self Care | Attending: Surgery

## 2024-07-04 ENCOUNTER — Ambulatory Visit: Payer: Self-pay | Admitting: Urgent Care

## 2024-07-04 ENCOUNTER — Encounter: Payer: Self-pay | Admitting: Surgery

## 2024-07-04 ENCOUNTER — Ambulatory Visit: Admitting: Certified Registered"

## 2024-07-04 ENCOUNTER — Ambulatory Visit
Admission: RE | Admit: 2024-07-04 | Discharge: 2024-07-04 | Disposition: A | Discharge status: Home / Self Care | Attending: Surgery | Admitting: Surgery

## 2024-07-04 ENCOUNTER — Ambulatory Visit

## 2024-07-04 DIAGNOSIS — Z8673 Personal history of transient ischemic attack (TIA), and cerebral infarction without residual deficits: Secondary | ICD-10-CM | POA: Diagnosis not present

## 2024-07-04 DIAGNOSIS — M797 Fibromyalgia: Secondary | ICD-10-CM | POA: Diagnosis not present

## 2024-07-04 DIAGNOSIS — M1711 Unilateral primary osteoarthritis, right knee: Secondary | ICD-10-CM | POA: Diagnosis present

## 2024-07-04 DIAGNOSIS — G40909 Epilepsy, unspecified, not intractable, without status epilepticus: Secondary | ICD-10-CM | POA: Insufficient documentation

## 2024-07-04 DIAGNOSIS — G4733 Obstructive sleep apnea (adult) (pediatric): Secondary | ICD-10-CM | POA: Insufficient documentation

## 2024-07-04 DIAGNOSIS — Z96652 Presence of left artificial knee joint: Secondary | ICD-10-CM | POA: Diagnosis not present

## 2024-07-04 HISTORY — PX: TOTAL KNEE ARTHROPLASTY: SHX125

## 2024-07-04 SURGERY — ARTHROPLASTY, KNEE, TOTAL
Anesthesia: General | Site: Knee | Laterality: Right

## 2024-07-04 MED ORDER — 0.9 % SODIUM CHLORIDE (POUR BTL) OPTIME
TOPICAL | Status: DC | PRN
  Administered 2024-07-04: 500 mL

## 2024-07-04 MED ORDER — DIPHENHYDRAMINE HCL 50 MG/ML IJ SOLN
INTRAMUSCULAR | Status: AC
  Filled 2024-07-04: qty 1

## 2024-07-04 MED ORDER — LACTATED RINGERS IV SOLN
INTRAVENOUS | Status: DC
Start: 2024-07-04 — End: 2024-07-04

## 2024-07-04 MED ORDER — TRANEXAMIC ACID-NACL 1000-0.7 MG/100ML-% IV SOLN
1000.0000 mg | INTRAVENOUS | Status: AC
  Administered 2024-07-04: 1000 mg via INTRAVENOUS

## 2024-07-04 MED ORDER — CEFAZOLIN SODIUM-DEXTROSE 2-4 GM/100ML-% IV SOLN
INTRAVENOUS | Status: AC
Start: 2024-07-04 — End: 2024-07-04
  Filled 2024-07-04: qty 100

## 2024-07-04 MED ORDER — LIDOCAINE HCL (CARDIAC) PF 100 MG/5ML IV SOSY
PREFILLED_SYRINGE | INTRAVENOUS | Status: DC | PRN
  Administered 2024-07-04: 100 mg via INTRAVENOUS

## 2024-07-04 MED ORDER — CHLORHEXIDINE GLUCONATE 0.12 % MT SOLN
15.0000 mL | Freq: Once | OROMUCOSAL | Status: AC
  Administered 2024-07-04: 15 mL via OROMUCOSAL

## 2024-07-04 MED ORDER — HYDROCODONE-ACETAMINOPHEN 5-325 MG PO TABS: 1.0000 | ORAL_TABLET | Freq: Four times a day (QID) | ORAL | 0 refills | Status: AC | PRN

## 2024-07-04 MED ORDER — CEFAZOLIN SODIUM-DEXTROSE 2-4 GM/100ML-% IV SOLN
2.0000 g | Freq: Four times a day (QID) | INTRAVENOUS | Status: DC
  Administered 2024-07-04: 2 g via INTRAVENOUS

## 2024-07-04 MED ORDER — ACETAMINOPHEN 10 MG/ML IV SOLN
INTRAVENOUS | Status: DC | PRN
  Administered 2024-07-04: 1000 mg via INTRAVENOUS

## 2024-07-04 MED ORDER — SODIUM CHLORIDE 0.9 % IV SOLN: INTRAVENOUS | Status: DC

## 2024-07-04 MED ORDER — BUPIVACAINE LIPOSOME 1.3 % IJ SUSP
INTRAMUSCULAR | Status: AC
  Filled 2024-07-04: qty 20

## 2024-07-04 MED ORDER — SUGAMMADEX SODIUM 200 MG/2ML IV SOLN
INTRAVENOUS | Status: DC | PRN
Start: 2024-07-04 — End: 2024-07-04
  Administered 2024-07-04: 200 mg via INTRAVENOUS

## 2024-07-04 MED ORDER — PHENYLEPHRINE HCL-NACL 20-0.9 MG/250ML-% IV SOLN
INTRAVENOUS | Status: DC | PRN
  Administered 2024-07-04: 25 ug/min via INTRAVENOUS

## 2024-07-04 MED ORDER — ONDANSETRON HCL 4 MG/2ML IJ SOLN
INTRAMUSCULAR | Status: DC | PRN
  Administered 2024-07-04: 4 mg via INTRAVENOUS

## 2024-07-04 MED ORDER — PHENYLEPHRINE HCL-NACL 20-0.9 MG/250ML-% IV SOLN
INTRAVENOUS | Status: AC
  Filled 2024-07-04: qty 250

## 2024-07-04 MED ORDER — DEXMEDETOMIDINE HCL IN NACL 80 MCG/20ML IV SOLN
INTRAVENOUS | Status: AC
Start: 2024-07-04 — End: 2024-07-04
  Filled 2024-07-04: qty 20

## 2024-07-04 MED ORDER — KETOROLAC TROMETHAMINE 30 MG/ML IJ SOLN
INTRAMUSCULAR | Status: AC
  Filled 2024-07-04: qty 1

## 2024-07-04 MED ORDER — HYDROMORPHONE HCL 1 MG/ML IJ SOLN: 0.5000 mg | INTRAMUSCULAR | Status: DC | PRN

## 2024-07-04 MED ORDER — OXYCODONE HCL 5 MG PO TABS
ORAL_TABLET | ORAL | Status: AC
  Filled 2024-07-04: qty 2

## 2024-07-04 MED ORDER — DEXMEDETOMIDINE HCL IN NACL 80 MCG/20ML IV SOLN
INTRAVENOUS | Status: DC | PRN
Start: 2024-07-04 — End: 2024-07-04
  Administered 2024-07-04 (×3): 4 ug via INTRAVENOUS
  Administered 2024-07-04: 8 ug via INTRAVENOUS
  Administered 2024-07-04: 4 ug via INTRAVENOUS

## 2024-07-04 MED ORDER — OXYCODONE HCL 5 MG PO TABS
5.0000 mg | ORAL_TABLET | ORAL | Status: DC | PRN
  Administered 2024-07-04 (×2): 5 mg via ORAL

## 2024-07-04 MED ORDER — ACETAMINOPHEN 500 MG PO TABS: 1000.0000 mg | ORAL_TABLET | Freq: Four times a day (QID) | ORAL | Status: DC

## 2024-07-04 MED ORDER — FENTANYL CITRATE (PF) 100 MCG/2ML IJ SOLN
INTRAMUSCULAR | Status: AC
  Filled 2024-07-04: qty 2

## 2024-07-04 MED ORDER — BUPIVACAINE-EPINEPHRINE (PF) 0.5% -1:200000 IJ SOLN
INTRAMUSCULAR | Status: AC
  Filled 2024-07-04: qty 30

## 2024-07-04 MED ORDER — SODIUM CHLORIDE (PF) 0.9 % IJ SOLN
INTRAMUSCULAR | Status: AC
  Filled 2024-07-04: qty 40

## 2024-07-04 MED ORDER — SODIUM CHLORIDE 0.9 % IV SOLN: 25.0000 mg | Freq: Once | INTRAVENOUS | Status: DC

## 2024-07-04 MED ORDER — MIDAZOLAM HCL 2 MG/2ML IJ SOLN
INTRAMUSCULAR | Status: AC
  Filled 2024-07-04: qty 2

## 2024-07-04 MED ORDER — CEFAZOLIN SODIUM-DEXTROSE 2-4 GM/100ML-% IV SOLN
2.0000 g | INTRAVENOUS | Status: AC
  Administered 2024-07-04: 2 g via INTRAVENOUS

## 2024-07-04 MED ORDER — OXYCODONE HCL 5 MG PO TABS
ORAL_TABLET | ORAL | Status: AC
  Filled 2024-07-04: qty 1

## 2024-07-04 MED ORDER — OXYCODONE HCL 5 MG PO TABS: 5.0000 mg | ORAL_TABLET | ORAL | 0 refills | Status: DC | PRN

## 2024-07-04 MED ORDER — TRIAMCINOLONE ACETONIDE 40 MG/ML IJ SUSP
INTRAMUSCULAR | Status: DC | PRN
  Administered 2024-07-04: 93 mL via INTRAMUSCULAR

## 2024-07-04 MED ORDER — ROCURONIUM BROMIDE 100 MG/10ML IV SOLN
INTRAVENOUS | Status: DC | PRN
  Administered 2024-07-04: 60 mg via INTRAVENOUS
  Administered 2024-07-04: 20 mg via INTRAVENOUS

## 2024-07-04 MED ORDER — KETAMINE HCL 50 MG/5ML IJ SOSY
PREFILLED_SYRINGE | INTRAMUSCULAR | Status: DC | PRN
  Administered 2024-07-04: 10 mg via INTRAVENOUS
  Administered 2024-07-04 (×2): 20 mg via INTRAVENOUS

## 2024-07-04 MED ORDER — FENTANYL CITRATE (PF) 100 MCG/2ML IJ SOLN
INTRAMUSCULAR | Status: DC | PRN
  Administered 2024-07-04 (×2): 25 ug via INTRAVENOUS
  Administered 2024-07-04: 50 ug via INTRAVENOUS
  Administered 2024-07-04 (×2): 25 ug via INTRAVENOUS
  Administered 2024-07-04: 50 ug via INTRAVENOUS

## 2024-07-04 MED ORDER — HYDROMORPHONE HCL 1 MG/ML IJ SOLN
INTRAMUSCULAR | Status: DC | PRN
  Administered 2024-07-04 (×2): .5 mg via INTRAVENOUS

## 2024-07-04 MED ORDER — MIDAZOLAM HCL 2 MG/2ML IJ SOLN
INTRAMUSCULAR | Status: DC | PRN
Start: 2024-07-04 — End: 2024-07-04
  Administered 2024-07-04: 2 mg via INTRAVENOUS

## 2024-07-04 MED ORDER — TRANEXAMIC ACID-NACL 1000-0.7 MG/100ML-% IV SOLN
INTRAVENOUS | Status: AC
  Filled 2024-07-04: qty 100

## 2024-07-04 MED ORDER — KETAMINE HCL 50 MG/5ML IJ SOSY
PREFILLED_SYRINGE | INTRAMUSCULAR | Status: AC
  Filled 2024-07-04: qty 5

## 2024-07-04 MED ORDER — DEXAMETHASONE SODIUM PHOSPHATE 10 MG/ML IJ SOLN
INTRAMUSCULAR | Status: DC | PRN
  Administered 2024-07-04: 10 mg via INTRAVENOUS

## 2024-07-04 MED ORDER — SODIUM CHLORIDE 0.9 % IR SOLN
Status: DC | PRN
  Administered 2024-07-04: 3000 mL

## 2024-07-04 MED ORDER — ACETAMINOPHEN 325 MG PO TABS: 325.0000 mg | ORAL_TABLET | Freq: Four times a day (QID) | ORAL | Status: DC | PRN

## 2024-07-04 MED ORDER — TRIAMCINOLONE ACETONIDE 40 MG/ML IJ SUSP
INTRAMUSCULAR | Status: AC
  Filled 2024-07-04: qty 2

## 2024-07-04 MED ORDER — KETOROLAC TROMETHAMINE 30 MG/ML IJ SOLN
30.0000 mg | Freq: Once | INTRAMUSCULAR | Status: AC
  Administered 2024-07-04: 30 mg via INTRAVENOUS

## 2024-07-04 MED ORDER — APIXABAN 2.5 MG PO TABS: 2.5000 mg | ORAL_TABLET | Freq: Two times a day (BID) | ORAL | 0 refills | Status: AC

## 2024-07-04 MED ORDER — METOCLOPRAMIDE HCL 10 MG PO TABS: 5.0000 mg | ORAL_TABLET | Freq: Three times a day (TID) | ORAL | Status: DC | PRN

## 2024-07-04 MED ORDER — DIPHENHYDRAMINE HCL 50 MG/ML IJ SOLN
25.0000 mg | Freq: Once | INTRAMUSCULAR | Status: AC
  Administered 2024-07-04: 25 mg via INTRAVENOUS

## 2024-07-04 MED ORDER — APIXABAN 2.5 MG PO TABS: 2.5000 mg | ORAL_TABLET | Freq: Two times a day (BID) | ORAL | 0 refills | Status: DC

## 2024-07-04 MED ORDER — ACETAMINOPHEN 10 MG/ML IV SOLN
INTRAVENOUS | Status: AC
Start: 2024-07-04 — End: 2024-07-04
  Filled 2024-07-04: qty 100

## 2024-07-04 MED ORDER — HYDROMORPHONE HCL 1 MG/ML IJ SOLN
INTRAMUSCULAR | Status: AC
  Filled 2024-07-04: qty 1

## 2024-07-04 MED ORDER — METOCLOPRAMIDE HCL 5 MG/ML IJ SOLN: 5.0000 mg | Freq: Three times a day (TID) | INTRAMUSCULAR | Status: DC | PRN

## 2024-07-04 MED ORDER — FENTANYL CITRATE (PF) 100 MCG/2ML IJ SOLN
25.0000 ug | INTRAMUSCULAR | Status: DC | PRN
  Administered 2024-07-04 (×2): 50 ug via INTRAVENOUS

## 2024-07-04 MED ORDER — ONDANSETRON HCL 4 MG PO TABS: 4.0000 mg | ORAL_TABLET | Freq: Four times a day (QID) | ORAL | Status: DC | PRN

## 2024-07-04 MED ORDER — CHLORHEXIDINE GLUCONATE 0.12 % MT SOLN
OROMUCOSAL | Status: AC
  Filled 2024-07-04: qty 15

## 2024-07-04 MED ORDER — PROPOFOL 10 MG/ML IV BOLUS
INTRAVENOUS | Status: DC | PRN
  Administered 2024-07-04: 150 mg via INTRAVENOUS

## 2024-07-04 MED ORDER — OXYCODONE HCL 5 MG PO TABS: 5.0000 mg | ORAL_TABLET | Freq: Once | ORAL | Status: DC | PRN

## 2024-07-04 MED ORDER — ONDANSETRON HCL 4 MG/2ML IJ SOLN
4.0000 mg | Freq: Four times a day (QID) | INTRAMUSCULAR | Status: DC | PRN
Start: 2024-07-04 — End: 2024-07-04

## 2024-07-04 MED ORDER — CEFAZOLIN SODIUM-DEXTROSE 2-4 GM/100ML-% IV SOLN
INTRAVENOUS | Status: AC
  Filled 2024-07-04: qty 100

## 2024-07-04 MED ORDER — ORAL CARE MOUTH RINSE: 15.0000 mL | Freq: Once | OROMUCOSAL | Status: AC

## 2024-07-04 MED ORDER — OXYCODONE HCL 5 MG/5ML PO SOLN: 5.0000 mg | Freq: Once | ORAL | Status: DC | PRN

## 2024-07-04 SURGICAL SUPPLY — 53 items
BLADE SAW 90X13X1.19 OSCILLAT (BLADE) ×2 IMPLANT
BLADE SAW SAG 25X90X1.19 (BLADE) ×2 IMPLANT
BLADE SURG SZ20 CARB STEEL (BLADE) ×2 IMPLANT
BNDG COMPR 6X5.8 VLCR NS LF (GAUZE/BANDAGES/DRESSINGS) ×2 IMPLANT
CEMENT BONE R 1X40 (Cement) ×4 IMPLANT
CEMENT VACUUM MIXING SYSTEM (MISCELLANEOUS) ×2 IMPLANT
CHLORAPREP W/TINT 26 (MISCELLANEOUS) ×2 IMPLANT
COMPONENT FEM KNEE NRW SZ9 RT (Joint) IMPLANT
COMPONET TIB PS KNEE E 0D RT (Joint) IMPLANT
COOLER ICEMAN CLASSIC (MISCELLANEOUS) ×2 IMPLANT
COVER MAYO STAND STRL (DRAPES) ×2 IMPLANT
CUFF TRNQT CYL 24X4X16.5-23 (TOURNIQUET CUFF) IMPLANT
CUFF TRNQT CYL 34X4.125X (TOURNIQUET CUFF) IMPLANT
DRAPE IMP U-DRAPE 54X76 (DRAPES) ×2 IMPLANT
DRAPE SHEET LG 3/4 BI-LAMINATE (DRAPES) ×2 IMPLANT
DRAPE U-SHAPE 47X51 STRL (DRAPES) ×2 IMPLANT
DRSG MEPILEX SACRM 8.7X9.8 (GAUZE/BANDAGES/DRESSINGS) IMPLANT
DRSG OPSITE POSTOP 4X10 (GAUZE/BANDAGES/DRESSINGS) ×2 IMPLANT
DRSG OPSITE POSTOP 4X8 (GAUZE/BANDAGES/DRESSINGS) ×2 IMPLANT
ELECT CAUTERY BLADE 6.4 (BLADE) ×2 IMPLANT
ELECTRODE REM PT RTRN 9FT ADLT (ELECTROSURGICAL) ×2 IMPLANT
GAUZE XEROFORM 1X8 LF (GAUZE/BANDAGES/DRESSINGS) ×2 IMPLANT
GLOVE BIO SURGEON STRL SZ7.5 (GLOVE) ×8 IMPLANT
GLOVE BIO SURGEON STRL SZ8 (GLOVE) ×8 IMPLANT
GLOVE BIOGEL PI IND STRL 8 (GLOVE) ×2 IMPLANT
GLOVE INDICATOR 8.0 STRL GRN (GLOVE) ×2 IMPLANT
GOWN STRL REUS W/ TWL LRG LVL3 (GOWN DISPOSABLE) ×2 IMPLANT
GOWN STRL REUS W/ TWL XL LVL3 (GOWN DISPOSABLE) ×2 IMPLANT
HOOD PEEL AWAY T7 (MISCELLANEOUS) ×6 IMPLANT
INSERT TIB ARTISURF SZ8-11X12 (Insert) IMPLANT
KIT TURNOVER KIT A (KITS) ×2 IMPLANT
MANIFOLD NEPTUNE II (INSTRUMENTS) ×2 IMPLANT
NDL SPNL 20GX3.5 QUINCKE YW (NEEDLE) ×2 IMPLANT
NEEDLE SPNL 20GX3.5 QUINCKE YW (NEEDLE) ×1 IMPLANT
NS IRRIG 500ML POUR BTL (IV SOLUTION) ×2 IMPLANT
PACK TOTAL KNEE (MISCELLANEOUS) ×2 IMPLANT
PAD COLD UNI WRAP-ON (PAD) ×2 IMPLANT
PENCIL SMOKE EVACUATOR (MISCELLANEOUS) ×2 IMPLANT
PIN DRILL HDLS TROCAR 75 4PK (PIN) IMPLANT
SCREW FEMALE HEX FIX 25X2.5 (ORTHOPEDIC DISPOSABLE SUPPLIES) IMPLANT
SOL .9 NS 3000ML IRR UROMATIC (IV SOLUTION) ×2 IMPLANT
STAPLER SKIN PROX 35W (STAPLE) ×2 IMPLANT
STEM POLY PAT PLY 32M KNEE (Knees) IMPLANT
STOCKINETTE IMPERV 14X48 (MISCELLANEOUS) ×2 IMPLANT
SUCTION TUBE FRAZIER 10FR DISP (SUCTIONS) ×2 IMPLANT
SUT VIC AB 0 CT1 36 (SUTURE) ×6 IMPLANT
SUT VIC AB 2-0 CT1 TAPERPNT 27 (SUTURE) ×6 IMPLANT
SYR 10ML LL (SYRINGE) ×2 IMPLANT
SYR 20ML LL LF (SYRINGE) ×2 IMPLANT
SYR 30ML LL (SYRINGE) IMPLANT
TIP FAN IRRIG PULSAVAC PLUS (DISPOSABLE) ×2 IMPLANT
TRAP FLUID SMOKE EVACUATOR (MISCELLANEOUS) ×2 IMPLANT
WATER STERILE IRR 1000ML POUR (IV SOLUTION) IMPLANT

## 2024-07-04 NOTE — H&P (Signed)
 History of Present Illness: The patient has a history of severe right knee osteoarthritic changes as well. She is reporting moderate pain in the right knee at today's visit. She reports increased discomfort when attempting to stand and walk for a prolonged period time. She reports that the right knee is keeping her from doing some of her day-to-day activities at this time. She is quite frustrated by her continued right knee pain and would like to discuss more aggressive treatment options. She denies any personal history of heart attack or stroke. She denies any history of asthma or COPD. She does have a history of stroke and does have a history of seizures. No history of DVT in the past. She is not diabetic.  Past Medical History: Anxiety  Arthritis  BPPV (benign paroxysmal positional vertigo) 05/21/2013  Cerebrovascular disease  Depression  Fibromyalgia  GERD (gastroesophageal reflux disease) 05/21/2013  History of TIA (transient ischemic attack) 05/21/2013  OSA (obstructive sleep apnea)  PONV (postoperative nausea and vomiting) - only happened one time  Pulmonary nodule  Seizure disorder (CMS/HHS-HCC) 05/23/2013  Seizures (CMS/HHS-HCC) - last seizure approx. 1 year ago  Stroke (CMS/HHS-HCC) 2007  sometimes lip numbess  Uterine prolapse   Past Surgical History: APPENDECTOMY 1972  LEFT OOPHORECTOMY 1994  CESAREAN SECTION 1994 x3  REPAIR VENTRAL HERNIA LAPAROSCOPIC 2004  COLONOSCOPY IN OR N/A 01/18/2015  Procedure: COLONOSCOPY IN OR, FLEXIBLE; DIAGNOSTIC, INCLUDING COLLECTION OF SPECIMEN(S) BY BRUSHING OR WASHING, WHEN PERFORMED (SEPARATE PROCEDURE); Surgeon: Norleen Hensen, MD; Location: [facility] OR; Service: General Surgery; Laterality: N/A;  HEMORRHOIDECTOMY BY SIMPLE LIGATION N/A 01/18/2015  Procedure: HEMORRHOIDECTOMY BY SIMPLE LIGATION; Surgeon: Norleen Hensen, MD; Location: [facility] OR; Service: General Surgery; Laterality: N/A;  ARTHROPLASTY TOTAL KNEE Left 02/22/2024 (Dr. Edie)  CHOLECYSTECTOMY   COLON SURGERY  HERNIA REPAIR  HYSTERECTOMY  KNEE ARTHROSCOPY  OOPHORECTOMY Right  Robot assisted partial colon removal   Past Family History: Cancer Mother  Heart disease Mother  Lung cancer Father  Colon cancer Father  Diabetes Father  Cancer Father  Arthritis Father  Substance Abuse Brother   Medications: apixaban  (ELIQUIS ) 2.5 mg tablet Take 1 tablet (2.5 mg total) by mouth every 12 (twelve) hours 30 tablet 0  celecoxib (CELEBREX) 200 MG capsule Take 1 capsule (200 mg total) by mouth 2 (two) times daily 60 capsule 2  etodolac (LODINE) 500 MG tablet Take 1 tablet (500 mg total) by mouth 2 (two) times daily 60 tablet 1  gabapentin  (NEURONTIN ) 100 MG capsule 2 capsules p.o. 3 times daily 180 capsule 0  levETIRAcetam  (KEPPRA ) 500 MG tablet Take 1 tablet (500 mg total) by mouth 2 (two) times daily 180 tablet 3  meloxicam  (MOBIC ) 15 MG tablet Take 1 tablet by mouth once daily  omeprazole  (PRILOSEC) 20 MG DR capsule Take 20 mg by mouth once daily  oxyBUTYnin  (DITROPAN -XL) 10 MG XL tablet Take 10 mg by mouth at bedtime  phenytoin  (DILANTIN ) 100 MG ER capsule Take 2 capsules (200 mg total) by mouth 2 (two) times daily May dispense any brand, may dispense regular with insurance does not cover ER 360 capsule 2   Allergies: Penicillins Hives and Anaphylaxis   Review of Systems:  A comprehensive 14 point ROS was performed, reviewed by me today, and the pertinent orthopaedic findings are documented in the HPI.  Physical Exam: BP 118/80  Ht 162.6 cm (5' 4)  Wt 93.9 kg (207 lb)  BMI 35.53 kg/m  General/Constitutional: The patient appears to be well-nourished, well-developed, and in no acute distress. Neuro/Psych: Normal  mood and affect, oriented to person, place and time. Eyes: Non-icteric. Pupils are equal, round, and reactive to light, and exhibit synchronous movement. ENT: Unremarkable. Lymphatic: No palpable adenopathy. Respiratory: Lungs clear to auscultation, Normal chest  excursion, No wheezes, and Non-labored breathing Cardiovascular: Regular rate and rhythm. No murmurs. and No edema, swelling or tenderness, except as noted in detailed exam. Integumentary: No impressive skin lesions present, except as noted in detailed exam. Musculoskeletal: Unremarkable, except as noted in detailed exam.  Right knee exam: The patient ambulates without any significant limp at this time. Skin examination of the right knee does reveal a moderate varus alignment. There is a mild effusion to the right knee. No open wound, erythema or ecchymosis. She does have moderate tenderness to palpation along the medial lateral joint line the right knee. She is able to extend -10 degrees and flex 90 degrees with moderate pain. Moderate crepitus range of motion activities. The right knee is stable to varus and valgus stress testing. Negative Lachman's test the right knee. She is intact light touch throughout the right lower extremity. Cap refills intact to each individual toe. Dorsalis pedis and posterior tibialis pulse are intact to the right lower extremity.  Imaging: AP, lateral and sunrise views of the right knee were obtained today in the office and reviewed by me. These x-rays demonstrate severe right knee osteoarthritic changes with significant loss of joint space along the medial compartment of the right knee in addition to lateral compartment as well. Significant osteophyte formation throughout the right knee. Underlying subchondral cystic changes are identified. No evidence of acute fracture, no evidence of dislocation.  Impression: 1. Primary osteoarthritis of right knee. 2. Status post total knee replacement using cement, left.  Plan:  1. Treatment options were discussed today with the patient. 2. Pertaining to the left knee, the patient is very pleased with her results from her recent left total knee arthroplasty. 3. The patient was encouraged to continue with routine activities, no  restrictions to the left knee at this time. She may continue with home exercises, she was instructed that she may stop formal therapy for her left knee at this time as she is doing quite well. She will follow-up in 3 months for repeat evaluation of the left knee and further x-rays. 4. Pertain to the right knee the patient is quite frustrated by her continued right knee discomfort and would like to discuss more aggressive treatment options at this time. 5. After a discussion of the risk and benefits the patient would like to proceed with a right total knee arthroplasty to perform by Dr. Edie in the future. 6. This document will serve as a surgical history and physical for the right total knee arthroplasty in the future. 7. The patient will follow-up per send postop protocol for the right knee at this time. They can call the clinic they have any questions, new symptoms develop or symptoms worsen.  The procedure was discussed with the patient, as were the potential risks (including bleeding, infection, nerve and/or blood vessel injury, persistent or recurrent pain, failure of the repair, instability, need for further surgery, blood clots, strokes, heart attacks and/or arhythmias, pneumonia, etc.) and benefits. The patient states her understanding and wishes to proceed.    H&P reviewed and patient re-examined. No changes.

## 2024-07-04 NOTE — Anesthesia Preprocedure Evaluation (Signed)
 Anesthesia Evaluation  Patient identified by MRN, date of birth, ID band Patient awake    Reviewed: Allergy & Precautions, NPO status , Patient's Chart, lab work & pertinent test results  History of Anesthesia Complications Negative for: history of anesthetic complications  Airway Mallampati: III   Neck ROM: Full    Dental  (+) Edentulous Upper, Edentulous Lower   Pulmonary sleep apnea , Current SmokerPatient did not abstain from smoking.   Pulmonary exam normal breath sounds clear to auscultation       Cardiovascular Normal cardiovascular exam Rhythm:Regular Rate:Normal  ECG 02/13/24: normal   Neuro/Psych Seizures - (last sz one year ago),  PSYCHIATRIC DISORDERS  Depression    TIA   GI/Hepatic ,GERD  ,,  Endo/Other  Obesity   Renal/GU negative Renal ROS     Musculoskeletal  (+) Arthritis ,  Fibromyalgia -  Abdominal   Peds  Hematology negative hematology ROS (+)   Anesthesia Other Findings   Reproductive/Obstetrics                              Anesthesia Physical Anesthesia Plan  ASA: 2  Anesthesia Plan: General   Post-op Pain Management:    Induction: Intravenous  PONV Risk Score and Plan: 2 and Treatment may vary due to age or medical condition, Ondansetron  and Dexamethasone   Airway Management Planned: Oral ETT  Additional Equipment:   Intra-op Plan:   Post-operative Plan: Extubation in OR  Informed Consent: I have reviewed the patients History and Physical, chart, labs and discussed the procedure including the risks, benefits and alternatives for the proposed anesthesia with the patient or authorized representative who has indicated his/her understanding and acceptance.     Dental advisory given  Plan Discussed with: CRNA  Anesthesia Plan Comments: (Patient refuses spinal 2/2 history of back pain after epidural. Patient consented for risks of anesthesia including  but not limited to:  - adverse reactions to medications - damage to eyes, teeth, lips or other oral mucosa - nerve damage due to positioning  - sore throat or hoarseness - damage to heart, brain, nerves, lungs, other parts of body or loss of life  Informed patient about role of CRNA in peri- and intra-operative care.  Patient voiced understanding.)         Anesthesia Quick Evaluation

## 2024-07-04 NOTE — Anesthesia Procedure Notes (Signed)
 Procedure Name: Intubation Date/Time: 07/04/2024 10:43 AM  Performed by: Lennie Lamarr HERO, CRNAPre-anesthesia Checklist: Patient identified, Emergency Drugs available, Suction available and Patient being monitored Patient Re-evaluated:Patient Re-evaluated prior to induction Oxygen Delivery Method: Circle System Utilized Preoxygenation: Pre-oxygenation with 100% oxygen Induction Type: IV induction Ventilation: Mask ventilation without difficulty and Oral airway inserted - appropriate to patient size Laryngoscope Size: McGrath and 3 Grade View: Grade I Tube type: Oral Tube size: 7.0 mm Number of attempts: 1 Airway Equipment and Method: Stylet and Oral airway Placement Confirmation: ETT inserted through vocal cords under direct vision, positive ETCO2 and breath sounds checked- equal and bilateral Secured at: 21 cm Tube secured with: Tape Dental Injury: Teeth and Oropharynx as per pre-operative assessment

## 2024-07-04 NOTE — TOC Progression Note (Signed)
 Transition of Care [facility]) - Progression Note    Patient Details  Name: Kristen Hudson MRN: [id number] Date of Birth: [birth year]  Transition of Care [facility]) CM/SW Contact  [place] L Zeshan [place], [place] Phone Number: 07/04/2024, 10:05 AM  Clinical Narrative:     DME orders placed for 3 IN 1. HH set up for [facility].    Home health set up by surgeon's office prior to surgery.         Expected Discharge Plan and Services                                               Social Determinants of Health (SDOH) Interventions SDOH Screenings   Food Insecurity: No Food Insecurity (02/22/2024)  Housing: Low Risk  (02/22/2024)  Transportation Needs: No Transportation Needs (02/22/2024)  Utilities: Not At Risk (02/22/2024)  Depression (PHQ2-9): Low Risk  (11/27/2021)  Financial Resource Strain: Low Risk  (02/07/2024)   Received from [facility]  Physical Activity: Inactive (11/27/2021)  Social Connections: Socially Isolated (02/22/2024)  Tobacco Use: High Risk (07/04/2024)    Readmission Risk Interventions     No data to display

## 2024-07-04 NOTE — Progress Notes (Signed)
 This patient is not able to walk the distance required to go the bathroom, or she is unable to safely negotiate stairs required to access the bathroom.  A 3-in-1 BSC will alleviate this problem.

## 2024-07-04 NOTE — Anesthesia Postprocedure Evaluation (Signed)
 Anesthesia Post Note  Patient: Kristen Hudson  Procedure(s) Performed: ARTHROPLASTY, KNEE, TOTAL (Right: Knee)  Patient location during evaluation: PACU Anesthesia Type: General Level of consciousness: awake and alert Pain management: pain level controlled Vital Signs Assessment: post-procedure vital signs reviewed and stable Respiratory status: spontaneous breathing, nonlabored ventilation, respiratory function stable and patient connected to nasal cannula oxygen Cardiovascular status: blood pressure returned to baseline and stable Postop Assessment: no apparent nausea or vomiting Anesthetic complications: no   No notable events documented.   Last Vitals:  Vitals:   07/04/24 1330 07/04/24 1400  BP: 110/70 109/71  Pulse: 78 79  Resp: 13 15  Temp:    SpO2: 94% 95%    Last Pain:  Vitals:   07/04/24 1425  TempSrc:   PainSc: 8                  Debby Mines

## 2024-07-04 NOTE — Transfer of Care (Signed)
 Immediate Anesthesia Transfer of Care Note  Patient: Kristen Hudson  Procedure(s) Performed: ARTHROPLASTY, KNEE, TOTAL (Right: Knee)  Patient Location: PACU  Anesthesia Type:General  Level of Consciousness: drowsy and patient cooperative  Airway & Oxygen Therapy: Patient Spontanous Breathing and Patient connected to face mask oxygen  Post-op Assessment: Report given to RN, Post -op Vital signs reviewed and stable, and Patient moving all extremities X 4  Post vital signs: Reviewed and stable  Last Vitals:  Vitals Value Taken Time  BP 119/77 07/04/24 13:13  Temp    Pulse 86 07/04/24 13:16  Resp 16 07/04/24 13:16  SpO2 100 % 07/04/24 13:16  Vitals shown include unfiled device data.  Last Pain:  Vitals:   07/04/24 0927  TempSrc: Temporal      Patients Stated Pain Goal: 0 (07/04/24 0927)  Complications: No notable events documented.

## 2024-07-04 NOTE — Discharge Instructions (Addendum)
 Orthopedic discharge instructions: May shower with intact OpSite dressing. Apply ice frequently to knee or use Polar Care. Start Eliquis  1 tablet (2.5 mg) twice daily on Wednesday, 07/05/2024, for 2 weeks, then take aspirin 325 mg twice daily for 4 weeks. Take pain medication as prescribed when needed.  May supplement with ES Tylenol  if necessary. May weight-bear as tolerated on right leg - use walker for balance and support. Follow-up in 10-14 days or as scheduled.  Information for Discharge Teaching:  DO NOT REMOVE TEAL EXPAREL  BRACELET FOR 4 DAYS, 07/08/2024 EXPAREL  (bupivacaine  liposome injectable suspension)   Pain relief is important to your recovery. The goal is to control your pain so you can move easier and return to your normal activities as soon as possible after your procedure. Your physician may use several types of medicines to manage pain, swelling, and more.  Your surgeon or anesthesiologist gave you EXPAREL (bupivacaine ) to help control your pain after surgery.  EXPAREL  is a local anesthetic designed to release slowly over an extended period of time to provide pain relief by numbing the tissue around the surgical site. EXPAREL  is designed to release pain medication over time and can control pain for up to 72 hours. Depending on how you respond to EXPAREL , you may require less pain medication during your recovery. EXPAREL  can help reduce or eliminate the need for opioids during the first few days after surgery when pain relief is needed the most. EXPAREL  is not an opioid and is not addictive. It does not cause sleepiness or sedation.   Important! A teal colored band has been placed on your arm with the date, time and amount of EXPAREL  you have received. Please leave this armband in place for the full 96 hours following administration, and then you may remove the band. If you return to the hospital for any reason within 96 hours following the administration of EXPAREL , the armband  provides important information that your health care providers to know, and alerts them that you have received this anesthetic.    Possible side effects of EXPAREL : Temporary loss of sensation or ability to move in the area where medication was injected. Nausea, vomiting, constipation Rarely, numbness and tingling in your mouth or lips, lightheadedness, or anxiety may occur. Call your doctor right away if you think you may be experiencing any of these sensations, or if you have other questions regarding possible side effects.  Follow all other discharge instructions given to you by your surgeon or nurse. Eat a healthy diet and drink plenty of water or other fluids.  POLAR CARE INFORMATION  MassAdvertisement.it  How to use Breg Polar Care [facility]?  YouTube   ShippingScam.co.uk  OPERATING INSTRUCTIONS  Start the product With dry hands, connect the transformer to the electrical connection located on the top of the cooler. Next, plug the transformer into an appropriate electrical outlet. The unit will automatically start running at this point.  To stop the pump, disconnect electrical power.  Unplug to stop the product when not in use. Unplugging the Polar Care unit turns it off. Always unplug immediately after use. Never leave it plugged in while unattended. Remove pad.    FIRST ADD WATER TO FILL LINE, THEN ICE---Replace ice when existing ice is almost melted  1 Discuss Treatment with your Licensed Health Care Practitioner and Use Only as Prescribed 2 Apply Insulation Barrier & Cold Therapy Pad 3 Check for Moisture 4 Inspect Skin Regularly  Tips and Trouble Shooting Usage Tips 1.  Use cubed or chunked ice for optimal performance. 2. It is recommended to drain the Pad between uses. To drain the pad, hold the Pad upright with the hose pointed toward the ground. Depress the black plunger and allow water to drain out. 3. You may disconnect the Pad from  the unit without removing the pad from the affected area by depressing the silver tabs on the hose coupling and gently pulling the hoses apart. The Pad and unit will seal itself and will not leak. Note: Some dripping during release is normal. 4. DO NOT RUN PUMP WITHOUT WATER! The pump in this unit is designed to run with water. Running the unit without water will cause permanent damage to the pump. 5. Unplug unit before removing lid.  TROUBLESHOOTING GUIDE Pump not running, Water not flowing to the pad, Pad is not getting cold 1. Make sure the transformer is plugged into the wall outlet. 2. Confirm that the ice and water are filled to the indicated levels. 3. Make sure there are no kinks in the pad. 4. Gently pull on the blue tube to make sure the tube/pad junction is straight. 5. Remove the pad from the treatment site and ll it while the pad is lying at; then reapply. 6. Confirm that the pad couplings are securely attached to the unit. Listen for the double clicks (Figure 1) to confirm the pad couplings are securely attached.  Leaks    Note: Some condensation on the lines, controller, and pads is unavoidable, especially in warmer climates. 1. If using a Breg Polar Care Cold Therapy unit with a detachable Cold Therapy Pad, and a leak exists (other than condensation on the lines) disconnect the pad couplings. Make sure the silver tabs on the couplings are depressed before reconnecting the pad to the pump hose; then confirm both sides of the coupling are properly clicked in. 2. If the coupling continues to leak or a leak is detected in the pad itself, stop using it and call B[employer] Customer Care at [phone].  Cleaning After use, empty and dry the unit with a soft cloth. Warm water and mild detergent may be used occasionally to clean the pump and tubes.  WARNING: The Polar Care Cube can be cold enough to cause serious injury, including full skin necrosis. Follow these Operating Instructions, and  carefully read the Product Insert (see pouch on side of unit) and the Cold Therapy Pad Fitting Instructions (provided with each Cold Therapy Pad) prior to use.

## 2024-07-04 NOTE — Evaluation (Signed)
 Physical Therapy Evaluation Patient Details Name: Kristen Hudson MRN: [id number] DOB: [birth year] Today's Date: 07/04/2024  History of Present Illness  Pt is s/p R TKA. PMH significant for anxiety, depression, BPPV, GERD, TIA, seizures, & fibromyalgia, recent L TKA.  Clinical Impression  Patient alert, agreeable to PT, did report 10/10 R knee pain with mobility, RN notified. At baseline the patient reported she was doing well since her last knee replacement, and that her sister is available to assist at discharge.   She was able to perform bed mobility modI. Sit <> Stand from EOB And from commode, reliant on BUE support, CGA and cues for technique needed. She ambulated ~85ft total with RW/CGA. Educated on step to gait pattern to address elevated pain, able to demonstrate technique. Pt also educated on HEP, polar care, TKE at rest, car transfers.  Overall the patient demonstrated deficits (see PT Problem List) that impede the patient's functional abilities, safety, and mobility and would benefit from skilled PT intervention.          If plan is discharge home, recommend the following: A little help with walking and/or transfers;A little help with bathing/dressing/bathroom;Assistance with cooking/housework;Assist for transportation;Help with stairs or ramp for entrance;Direct supervision/assist for medications management   Can travel by private vehicle        Equipment Recommendations BSC/3in1  Recommendations for Other Services       Functional Status Assessment Patient has had a recent decline in their functional status and demonstrates the ability to make significant improvements in function in a reasonable and predictable amount of time.     Precautions / Restrictions Precautions Precautions: Fall;Knee Precaution Booklet Issued: Yes (comment) Recall of Precautions/Restrictions: Intact Restrictions Weight Bearing Restrictions Per Provider Order: Yes RLE Weight Bearing Per Provider  Order: Weight bearing as tolerated      Mobility  Bed Mobility Overal bed mobility: Modified Independent                  Transfers Overall transfer level: Needs assistance Equipment used: Rolling walker (2 wheels) Transfers: Sit to/from Stand Sit to Stand: Contact guard assist           General transfer comment: cued for hand placement    Ambulation/Gait Ambulation/Gait assistance: Contact guard assist Gait Distance (Feet): 30 Feet Assistive device: Rolling walker (2 wheels) Gait Pattern/deviations: Step-to pattern, Antalgic Gait velocity: decreased     General Gait Details: limited distance due to pain  Stairs            Wheelchair Mobility     Tilt Bed    Modified Rankin (Stroke Patients Only)       Balance Overall balance assessment: Needs assistance Sitting-balance support: Feet supported Sitting balance-Leahy Scale: Good Sitting balance - Comments: pericare in sitting   Standing balance support: Bilateral upper extremity supported, During functional activity Standing balance-Leahy Scale: Fair                               Pertinent Vitals/Pain Pain Assessment Pain Assessment: 0-10 Pain Score: 10-Worst pain ever Pain Location: R knee Pain Descriptors / Indicators: Aching, Sore, Grimacing, Guarding Pain Intervention(s): Limited activity within patient's tolerance, Monitored during session, Repositioned, Patient requesting pain meds-RN notified    Home Living Family/patient expects to be discharged to:: Private residence Living Arrangements: Other relatives (sister) Available Help at Discharge: Family Type of Home: House Home Access: Ramped entrance       Home Layout: One  level Home Equipment: Rollator (4 wheels);Shower seat      Prior Function Prior Level of Function : History of Falls (last six months);Driving             Mobility Comments: household distance to limited community distances with 4WW, was  doing well after her last TKA ADLs Comments: supervision to modI     Extremity/Trunk Assessment   Upper Extremity Assessment Upper Extremity Assessment: Overall WFL for tasks assessed    Lower Extremity Assessment Lower Extremity Assessment: Overall WFL for tasks assessed (able to perform R SLR)       Communication        Cognition Arousal: Alert Behavior During Therapy: WFL for tasks assessed/performed   PT - Cognitive impairments: No apparent impairments                         Following commands: Intact       Cueing       General Comments      Exercises Total Joint Exercises Ankle Circles/Pumps: AROM, Both, 10 reps Heel Slides: AROM, Strengthening, Both, 10 reps Knee Flexion: Strengthening, Right, 10 reps, AAROM Goniometric ROM: 0-60degrees   Assessment/Plan    PT Assessment Patient needs continued PT services  PT Problem List Decreased strength;Pain;Decreased range of motion;Decreased knowledge of use of DME;Decreased activity tolerance;Decreased balance;Decreased mobility;Decreased knowledge of precautions       PT Treatment Interventions DME instruction;Balance training;Gait training;Neuromuscular re-education;Stair training;Functional mobility training;Patient/family education;Therapeutic activities;Therapeutic exercise    PT Goals (Current goals can be found in the Care Plan section)  Acute Rehab PT Goals Patient Stated Goal: to go home PT Goal Formulation: With patient Time For Goal Achievement: 07/18/24 Potential to Achieve Goals: Good    Frequency BID     Co-evaluation               AM-PAC PT 6 Clicks Mobility  Outcome Measure Help needed turning from your back to your side while in a flat bed without using bedrails?: None Help needed moving from lying on your back to sitting on the side of a flat bed without using bedrails?: None Help needed moving to and from a bed to a chair (including a wheelchair)?: None Help needed  standing up from a chair using your arms (e.g., wheelchair or bedside chair)?: None Help needed to walk in hospital room?: A Little Help needed climbing 3-5 steps with a railing? : A Little 6 Click Score: 22    End of Session Equipment Utilized During Treatment: Gait belt Activity Tolerance: Patient tolerated treatment well Patient left: in chair;with family/visitor present Nurse Communication: Mobility status PT Visit Diagnosis: Other abnormalities of gait and mobility (R26.89);Difficulty in walking, not elsewhere classified (R26.2);Muscle weakness (generalized) (M62.81);Pain Pain - Right/Left: Right Pain - part of body: Knee    Time: 8482-8460 PT Time Calculation (min) (ACUTE ONLY): 22 min   Charges:   PT Evaluation $PT Eval Low Complexity: 1 Low PT Treatments $Therapeutic Activity: 8-22 mins PT General Charges $$ ACUTE PT VISIT: 1 Visit        Doyal Shams PT, DPT 3:59 PM,07/04/24

## 2024-07-04 NOTE — Op Note (Signed)
 07/04/2024  12:59 PM  Patient:   Kristen Hudson  Pre-Op Diagnosis:   Degenerative joint disease, right knee.  Post-Op Diagnosis:   Same  Procedure:   Right TKA using all-cemented Zimmer Persona system with a #9 narrow PCR femur, a(n) E-sized  tibial tray with a 12 mm medial congruent E-poly insert, and an 8.5 x 32 mm all-poly 3-pegged domed patella.  Surgeon:   DOROTHA Reyes Maltos, MD  Assistant:   Gustavo Level, PA-C; [place] Napolitano, PA-S  Anesthesia:   GET  Findings:   As above  Complications:   None  EBL:   20 cc  Fluids:   700 cc crystalloid  UOP:   None  TT:   100 minutes at 300 mmHg  Drains:   None  Closure:   Staples  Implants:   As above  Brief Clinical Note:   The patient is a 58 year old female with a long history of progressively worsening right knee pain. The patient's symptoms have progressed despite medications, activity modification, injections, etc. The patient's history and examination were consistent with advanced degenerative joint disease of the right knee confirmed by plain radiographs. The patient presents at this time for a right total knee arthroplasty.  Procedure:   The patient was brought into the operating room and lain in the supine position. After adequate general endotracheal intubation and anesthesia was obtained, the right lower extremity was prepped with ChloraPrep solution and draped sterilely. Preoperative antibiotics were administered. A timeout was performed to verify the appropriate surgical site before the limb was exsanguinated with an Esmarch and the tourniquet inflated to 300 mmHg.   A standard anterior approach to the knee was made through an approximately 6-7 inch incision. The incision was carried down through the subcutaneous tissues to expose superficial retinaculum. This was split the length of the incision and the medial flap elevated sufficiently to expose the medial retinaculum. The medial retinaculum was incised, leaving a 3-4 mm  cuff of tissue on the patella. This was extended distally along the medial border of the patellar tendon and proximally through the medial third of the quadriceps tendon. A subtotal fat pad excision was performed before the soft tissues were elevated off the anteromedial and anterolateral aspects of the proximal tibia to the level of the collateral ligaments. The anterior portions of the medial and lateral menisci were removed, as was the anterior cruciate ligament. With the knee flexed to 90, the external tibial guide was positioned and the appropriate proximal tibial cut made. This piece was taken to the back table where it was measured and found to be optimally replicated by a(n) E-sized component.  Attention was directed to the distal femur. The intramedullary canal was accessed through a 3/8 drill hole. The intramedullary guide was inserted and placed at 5 of valgus alignment. Using the +0 slot, the distal cut was made. The distal femur was measured and found to be optimally replicated by the #9 component. The #9 4-in-1 cutting block was positioned and first the posterior, then the posterior chamfer, the anterior, and finally the anterior chamfer cuts were made after verifying that the anterior cortex would not be notched.   At this point, the posterior portions medial and lateral menisci were removed. A trial reduction was performed using the appropriate femoral and tibial components with first the 10 mm, then the 11 mm, and finally the 12 mm insert. The 12 mm insert demonstrated excellent stability to varus and valgus stressing both in flexion and extension while permitting  full extension. Patellar tracking was assessed and found to be excellent. Therefore, the tibial trial position was marked on the proximal tibia. The patella thickness was measured and found to be 21 mm. Therefore, the appropriate cut was made. The patellar surface was measured and found to be optimally replicated by the 32 mm  component. The three peg holes were drilled in place before the trial button was inserted. Patella tracking was assessed and found to be excellent, passing the no thumb test. The lug holes were drilled into the distal femur before the trial component was removed.  The tibial tray was repositioned before the keel was created using the appropriate tower, reamer, and punch.  The bony surfaces were prepared for cementing by irrigating them thoroughly with sterile saline solution via the jet lavage system. A bone plug was fashioned from some of the bone that had been removed previously and used to plug the distal femoral canal. In addition, a cocktail of 20 cc of Exparel , 30 cc of 0.5% Sensorcaine , 2 cc of Kenalog  40 (80 mg), and 30 mg of Toradol  diluted out to 90 cc with normal saline was injected into the postero-medial and postero-lateral aspects of the knee, the medial and lateral gutter regions, and the peri-incisional tissues to help with postoperative analgesia. Meanwhile, the cement was being mixed on the back table.   When the cement was ready, the tibial tray was cemented in first. The excess cement was removed using Personal assistant. Next, the femoral component was impacted into place. Again, the excess cement was removed using Personal assistant. The 12 mm trial insert was positioned and the knee brought into extension while the cement hardened. Finally, the patella was cemented into place and secured using the patellar clamp. Again, the excess cement was removed using Personal assistant. Once the cement had hardened, the knee was placed through a range of motion with the findings as described above. Therefore, the trial insert was removed and, after verifying that no cement had been retained posteriorly, the permanent 12 mm medial congruent E-polyethylene insert was snapped into place with care taken to ensure appropriate locking of the insert. Again the knee was placed through a range of motion with the  findings as described above.  The wound was copiously irrigated with sterile saline solution using the jet lavage system before the quadriceps tendon and retinacular layer were reapproximated using #0 Vicryl interrupted sutures. The superficial retinacular layer also was closed using a running #0 Vicryl suture. The subcutaneous tissues were closed in several layers using 2-0 Vicryl interrupted sutures. The skin was closed using staples. A sterile honeycomb dressing was applied to the skin before the leg was wrapped with an Ace wrap to accommodate the Polar Care device. The patient was then awakened, extubated, and returned to the recovery room in satisfactory condition after tolerating the procedure well.

## 2024-07-05 ENCOUNTER — Encounter: Payer: Self-pay | Admitting: Surgery

## 2024-07-05 ENCOUNTER — Telehealth: Payer: Self-pay

## 2024-08-25 NOTE — Progress Notes (Signed)
 Chief Complaint: Chief Complaint  Patient presents with  . Left Knee - Post Operative Visit    LT TKA - 02.25.25/POGGI     Kristen Hudson is a 58 y.o. female who presents today for her 42-month appointment status post a left total knee arthroplasty performed by Dr. Edie in February of this year.  Overall the patient feels that she is doing very well pertaining to her left knee.  She denies any recent falls or trauma affecting left knee since her last evaluation.  She denies any numbness or tingling into the left lower extremity at today's visit.  She denies any catching or locking symptoms.  She has completed formal physical therapy for the left knee but is currently working with them as she is also status post a right total knee arthroplasty at this time.  She denies any numbness or ting in the left lower extremity.  Denies any signs of infection at home such as fevers or chills or any drainage from left knee incision site.  She is very pleased with her left knee at this time.  She has returned back to most if not all of her routine activities at this time.  Past Medical History: Past Medical History:  Diagnosis Date  . Anxiety   . Arthritis   . BPPV (benign paroxysmal positional vertigo) 05/21/2013  . Cerebrovascular disease   . Depression   . Fibromyalgia   . GERD (gastroesophageal reflux disease) 05/21/2013  . History of TIA (transient ischemic attack) 05/21/2013  . OSA (obstructive sleep apnea)   . PONV (postoperative nausea and vomiting)    only happened one time  . Pulmonary nodule   . Seizure disorder (CMS/HHS-HCC) 05/23/2013  . Seizures (CMS/HHS-HCC)    last seizure approx. 1 year ago  . Stroke (CMS/HHS-HCC) 2007   sometimes lip numbess  . Uterine prolapse     Past Surgical History: Past Surgical History:  Procedure Laterality Date  . APPENDECTOMY  1972  . LEFT OOPHORECTOMY  1994  . CESAREAN SECTION  1994   x3  . REPAIR VENTRAL HERNIA LAPAROSCOPIC  2004  . COLONOSCOPY  IN OR N/A 01/18/2015   Procedure: COLONOSCOPY IN OR, FLEXIBLE; DIAGNOSTIC, INCLUDING COLLECTION OF SPECIMEN(S) BY BRUSHING OR WASHING, WHEN PERFORMED (SEPARATE PROCEDURE);  Surgeon: Norleen Hensen, MD;  Location: [facility] OR;  Service: General Surgery;  Laterality: N/A;  . HEMORRHOIDECTOMY BY SIMPLE LIGATION N/A 01/18/2015   Procedure: HEMORRHOIDECTOMY BY SIMPLE LIGATION;  Surgeon: Norleen Hensen, MD;  Location: [facility] OR;  Service: General Surgery;  Laterality: N/A;  . ARTHROPLASTY TOTAL KNEE Left 02/22/2024   Dr. Edie  . KNEE ARTHROSCOPY Right 07/04/2024   By Dr. Edie  . ARTHROPLASTY TOTAL KNEE Right 07/04/2024   Dr. Edie  . CHOLECYSTECTOMY    . COLON SURGERY    . HERNIA REPAIR    . HYSTERECTOMY    . OOPHORECTOMY Right    Robot assisted  . partial colon removal      Past Family History: Family History  Problem Relation Age of Onset  . Cancer Mother   . Heart disease Mother   . Lung cancer Father   . Colon cancer Father   . Diabetes Father   . Cancer Father   . Arthritis Father   . Substance Abuse Brother     Medications: Current Outpatient Medications  Medication Sig Dispense Refill  . apixaban  (ELIQUIS ) 2.5 mg tablet Take 1 tablet (2.5 mg total) by mouth every 12 (twelve) hours for 14 days 28  tablet 0  . celecoxib (CELEBREX) 200 MG capsule Take 1 capsule (200 mg total) by mouth 2 (two) times daily 60 capsule 2  . etodolac (LODINE) 500 MG tablet Take 1 tablet (500 mg total) by mouth 2 (two) times daily 60 tablet 1  . gabapentin  (NEURONTIN ) 100 MG capsule 2 capsules p.o. 3 times daily 180 capsule 0  . levETIRAcetam  (KEPPRA ) 500 MG tablet Take 1 tablet (500 mg total) by mouth 2 (two) times daily 180 tablet 3  . meloxicam  (MOBIC ) 15 MG tablet Take 1 tablet by mouth once daily    . omeprazole  (PRILOSEC) 20 MG DR capsule Take 20 mg by mouth once daily    . oxyBUTYnin  (DITROPAN -XL) 10 MG XL tablet Take 10 mg by mouth at bedtime    . phenytoin  (DILANTIN ) 100 MG ER capsule Take 2 capsules  (200 mg total) by mouth 2 (two) times daily May dispense any brand, may dispense regular with insurance does not cover ER 360 capsule 2   No current facility-administered medications for this visit.    Allergies: Allergies  Allergen Reactions  . Penicillins Hives and Anaphylaxis    Did it involve swelling of the face/tongue/throat, SOB, or low BP? No Did it involve sudden or severe rash/hives, skin peeling, or any reaction on the inside of your mouth or nose? No Did you need to seek medical attention at a hospital or doctor's office? No When did it last happen?     teenager  If all above answers are NO, may proceed with cephalosporin use.      Review of Systems:  A comprehensive 14 point ROS was performed, reviewed by me today, and the pertinent orthopaedic findings are documented in the HPI.   Exam: Ht 162.6 cm (5' 4)   Wt 87.8 kg (193 lb 9.6 oz)   BMI 33.23 kg/m  General/Constitutional: The patient appears to be well-nourished, well-developed, and in no acute distress. Neuro/Psych: Normal mood and affect, oriented to person, place and time. Eyes: Non-icteric.  Pupils are equal, round, and reactive to light, and exhibit synchronous movement. ENT: Unremarkable. Lymphatic: No palpable adenopathy. Respiratory: Lungs clear to auscultation, Normal chest excursion, No wheezes, and Non-labored breathing Cardiovascular: Regular rate and rhythm.  No murmurs. and No edema, swelling or tenderness, except as noted in detailed exam. Integumentary: No impressive skin lesions present, except as noted in detailed exam. Musculoskeletal: Unremarkable, except as noted in detailed exam.  Left knee exam: The patient ambulates without any significant limp at this time skin inspection of the left knee demonstrates her surgical incision to be well-healed and without evidence for infection.  There is perhaps mild swelling around the knee without an effusion, but no erythema, ecchymosis, abrasions, or  other skin abnormalities are identified.  She denies any tenderness with palpation throughout the left knee including over the medial and lateral joint line.  Actively she is able to extend within 5 degrees of full extension and flex close to 120 degrees.  Her patella tracks well and is without crepitance.  The knee is stable to varus and valgus stressing.  She is neurovascularly intact to the left lower extremity and foot.  Negative Homans to the left lower extremity.  Imaging: AP, lateral and sunrise views of the left knee were obtained today in the office and reviewed by me.  These x-rays demonstrate that the patient is status post a left total knee arthroplasty.  Hardware is in excellent position without any evidence of loosening.  There is  no evidence of acute fracture.  No effusion is identified.  No acute abnormality is noted to the left knee at this time.  Impression: Status post total knee replacement using cement, left [Z96.652] Status post total knee replacement using cement, left  (primary encounter diagnosis) Primary osteoarthritis of left knee  Plan:  1.  Treatment options were discussed today with the patient. 2.  Pertaining to the left knee, the patient is very pleased with her results from her recent left total knee arthroplasty. 3.  She may continue to perform her activities as tolerated for the left knee at this time.  There are no restrictions to the left knee. 4.  The patient will follow-up with me in October for repeat evaluation status post a right total knee arthroplasty. 5.  Will plan on a follow-up close to 1 year status post her left total knee arthroplasty 6.  The patient can contact me in the future if she has any questions, new symptoms or symptoms worsen.  This office visit took 30 minutes, of which >50% involved patient counseling/education.  Review of the [place] CSRS was performed in accordance of the [employer] prior to dispensing any controlled drugs.  This note was  generated in part with voice recognition software and I apologize for any typographical errors that were not detected and corrected.  DOROTHA Gustavo Level, PA-C, CAQ-OS [facility]

## 2024-08-27 ENCOUNTER — Emergency Department
Admission: EM | Admit: 2024-08-27 | Discharge: 2024-08-27 | Disposition: A | Discharge status: Home / Self Care | Attending: Emergency Medicine | Admitting: Emergency Medicine

## 2024-08-27 ENCOUNTER — Emergency Department

## 2024-08-27 ENCOUNTER — Other Ambulatory Visit: Payer: Self-pay

## 2024-08-27 DIAGNOSIS — Z96652 Presence of left artificial knee joint: Secondary | ICD-10-CM | POA: Diagnosis not present

## 2024-08-27 DIAGNOSIS — R42 Dizziness and giddiness: Secondary | ICD-10-CM | POA: Insufficient documentation

## 2024-08-27 DIAGNOSIS — N390 Urinary tract infection, site not specified: Secondary | ICD-10-CM | POA: Insufficient documentation

## 2024-08-27 LAB — COMPREHENSIVE METABOLIC PANEL WITH GFR
ALT: 13 U/L (ref 0–44)
AST: 17 U/L (ref 15–41)
Albumin: 4.4 g/dL (ref 3.5–5.0)
Alkaline Phosphatase: 77 U/L (ref 38–126)
Anion gap: 8 (ref 5–15)
BUN: 22 mg/dL — ABNORMAL HIGH (ref 6–20)
CO2: 24 mmol/L (ref 22–32)
Calcium: 9.2 mg/dL (ref 8.9–10.3)
Chloride: 108 mmol/L (ref 98–111)
Creatinine, Ser: 1.02 mg/dL — ABNORMAL HIGH (ref 0.44–1.00)
GFR, Estimated: >60 mL/min (ref >60)
Glucose, Bld: 98 mg/dL (ref 70–99)
Potassium: 4 mmol/L (ref 3.5–5.1)
Sodium: 140 mmol/L (ref 135–145)
Total Bilirubin: 0.4 mg/dL (ref 0.0–1.2)
Total Protein: 7.3 g/dL (ref 6.5–8.1)

## 2024-08-27 LAB — URINALYSIS, ROUTINE W REFLEX MICROSCOPIC
Bilirubin Urine: NEGATIVE
Glucose, UA: NEGATIVE mg/dL
Hgb urine dipstick: NEGATIVE
Ketones, ur: NEGATIVE mg/dL
Nitrite: NEGATIVE
Protein, ur: NEGATIVE mg/dL
Specific Gravity, Urine: 1.009 (ref 1.005–1.030)
pH: 5 (ref 5.0–8.0)

## 2024-08-27 LAB — CBC
HCT: 40.8 % (ref 36.0–46.0)
Hemoglobin: 13.6 g/dL (ref 12.0–15.0)
MCH: 30.9 pg (ref 26.0–34.0)
MCHC: 33.3 g/dL (ref 30.0–36.0)
MCV: 92.7 fL (ref 80.0–100.0)
Platelets: 278 K/uL (ref 150–400)
RBC: 4.4 MIL/uL (ref 3.87–5.11)
RDW: 12.1 % (ref 11.5–15.5)
WBC: 7.8 K/uL (ref 4.0–10.5)
nRBC: 0 % (ref 0.0–0.2)

## 2024-08-27 LAB — TROPONIN I (HIGH SENSITIVITY)
Troponin I (High Sensitivity): 4 ng/L (ref <18)
Troponin I (High Sensitivity): 4 ng/L (ref <18)

## 2024-08-27 MED ORDER — NITROFURANTOIN MONOHYD MACRO 100 MG PO CAPS
100.0000 mg | ORAL_CAPSULE | Freq: Once | ORAL | Status: DC
  Filled 2024-08-27: qty 1

## 2024-08-27 MED ORDER — NITROFURANTOIN MONOHYD MACRO 100 MG PO CAPS
100.0000 mg | ORAL_CAPSULE | Freq: Two times a day (BID) | ORAL | 0 refills | Status: AC
Start: 2024-08-27 — End: 2024-09-01

## 2024-08-27 MED ORDER — METOCLOPRAMIDE HCL 10 MG PO TABS
10.0000 mg | ORAL_TABLET | Freq: Once | ORAL | Status: AC
  Administered 2024-08-27: 10 mg via ORAL
  Filled 2024-08-27: qty 1

## 2024-08-27 MED ORDER — LACTATED RINGERS IV BOLUS
1000.0000 mL | Freq: Once | INTRAVENOUS | Status: AC
  Administered 2024-08-27: 1000 mL via INTRAVENOUS

## 2024-08-27 NOTE — Discharge Instructions (Signed)
 Please make sure to keep yourself hydrated, please take the antibiotics as prescribed for UTI.  Please make sure to follow-up with your primary care doctor for further management of your symptoms as well as to get reassessed.

## 2024-08-27 NOTE — ED Notes (Signed)
 Patient ready to d/c and antibiotic not administered.

## 2024-08-27 NOTE — ED Provider Notes (Signed)
 Kristen Hudson Provider Note    Event Date/Time   First MD Initiated Contact with Patient 08/27/24 1154     (approximate)   History   Dizziness   HPI  Kristen Hudson is a 58 y.o. female with history of depression, fibromyalgia, OSA, history of seizures on Dilantin  presenting with dizziness.  States that is both lightheadedness and room spinning, started this morning when she woke up and was trying to get into the shower.  States that she was completely fine yesterday night when she went to bed around 8 or 9 PM.  Denies prior history of a stroke.  No focal weakness or numbness.  No recent seizures.  Denies any alcohol use or drug use.  States that she has been compliant with her medications.  She denies any congestion or fever, no headache or vision changes, no chest pain or shortness of breath, no urinary symptoms.  Denies any nausea vomiting or diarrhea.  Lives at home alone.  On independent chart review she was seen by orthopedic surgery in the end of August, she is 59-month status post left total knee arthroplasty.  No recent trauma or falls.  No numbness tingling.     Physical Exam   Triage Vital Signs: ED Triage Vitals [08/27/24 1143]  Encounter Vitals Group     BP 124/89     Girls Systolic BP Percentile      Girls Diastolic BP Percentile      Boys Systolic BP Percentile      Boys Diastolic BP Percentile      Pulse Rate 78     Resp 16     Temp 97.9 F (36.6 C)     Temp Source Oral     SpO2 100 %     Weight 193 lb (87.5 kg)     Height 5' 3 (1.6 m)     Head Circumference      Peak Flow      Pain Score 0     Pain Loc      Pain Education      Exclude from Growth Chart     Most recent vital signs: Vitals:   08/27/24 1400 08/27/24 1430  BP: (!) 133/99   Pulse: 63 75  Resp: (!) 21 17  Temp:    SpO2: 99% 100%     General: Awake, no distress.  CV:  Good peripheral perfusion.  Resp:  Normal effort.  No tachypnea or respiratory  distress Abd:  No distention.  Soft nontender Other:  She has some resting tremors to her bilateral upper extremities that got worse when she was try to describe her symptoms and improved when she calm down.  She has no focal weakness or numbness, no cranial nerve deficits, pupils are equal, was able to get up to stand, she was not overtly ataxic but she felt quite lightheaded when she was walking.   ED Results / Procedures / Treatments   Labs (all labs ordered are listed, but only abnormal results are displayed) Labs Reviewed  COMPREHENSIVE METABOLIC PANEL WITH GFR - Abnormal; Notable for the following components:      Result Value   BUN 22 (*)    Creatinine, Ser 1.02 (*)    All other components within normal limits  URINALYSIS, ROUTINE W REFLEX MICROSCOPIC - Abnormal; Notable for the following components:   Color, Urine YELLOW (*)    APPearance CLEAR (*)    Leukocytes,Ua SMALL (*)    Bacteria,  UA RARE (*)    All other components within normal limits  CBC  TROPONIN I (HIGH SENSITIVITY)  TROPONIN I (HIGH SENSITIVITY)     EKG  EKG shows, sinus rhythm, rate 78, normal QS, normal QTc, obvious ischemic ST elevation, T wave flattening in aVL, V2, T wave changes new compared to prior   RADIOLOGY On my independent interpretation, CT head without obvious intracranial hemorrhage   PROCEDURES:  Critical Care performed: No  Procedures   MEDICATIONS ORDERED IN ED: Medications  nitrofurantoin  (macrocrystal-monohydrate) (MACROBID ) capsule 100 mg (has no administration in time range)  lactated ringers  bolus 1,000 mL (1,000 mLs Intravenous New Bag/Given 08/27/24 1322)  metoCLOPramide  (REGLAN ) tablet 10 mg (10 mg Oral Given 08/27/24 1329)     IMPRESSION / MDM / ASSESSMENT AND PLAN / ED COURSE  I reviewed the triage vital signs and the nursing notes.                              Differential diagnosis includes, but is not limited to, electrolyte derangements, dehydration,  arrhythmia, atypical ACS, UTI, patient has no obvious focal deficits on exam at this time, she does describe the dizziness as a mix of room spinning and lightheadedness, no stroke alert was activated given that she is outside tPA window.  Will get labs, EKG, troponin, chest x-ray, CT head, will likely proceed with an MRI if she is still persistently dizzy.  IV fluids.  Patient's presentation is most consistent with acute presentation with potential threat to life or bodily function.  Independent interpretation of labs and imaging below.  Clinical course as below.  Patient is ambulatory, symptoms have improved, will send a prescription for Macrobid  to her pharmacy, instructed her to follow-up with primary care doctor this week to get reassessed.  Strict return precautions were given.  Considered but no indication for inpatient admission at this time, she safe for outpatient management.  Shared decision making to patient and she is agreeable with this plan.  Discharge.  The patient is on the cardiac monitor to evaluate for evidence of arrhythmia and/or significant heart rate changes.   Clinical Course as of 08/27/24 1529  Sun Aug 27, 2024  1244 CT Head Wo Contrast IMPRESSION: 1. No acute intracranial abnormality. 2. 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   [TT]  1244 Independent review of labs, no leukocytosis, electrolytes not severely deranged, LFTs are normal, UA shows small leukocytes, rare bacteria. [TT]  1441 Reassessment patient is feeling a lot better, ambulatory with steady gait, discussed with her about imaging and lab results including findings of UTI.  Patient states that she has a primary care doctor that she can follow-up with. [TT]  1528 Troponin I (High Sensitivity) Troponin x 2 is negative. [TT]    Clinical Course User Index [TT] Waymond Lorelle Cummins, MD     FINAL CLINICAL IMPRESSION(S) /  ED DIAGNOSES   Final diagnoses:  Dizziness  Lightheadedness  Urinary tract infection without hematuria, site unspecified     Rx / DC Orders   ED Discharge Orders          Ordered    nitrofurantoin , macrocrystal-monohydrate, (MACROBID ) 100 MG capsule  2 times daily        08/27/24 1442             Note:  This document was prepared using Dragon voice recognition  software and may include unintentional dictation errors.    Waymond Lorelle Cummins, MD 08/27/24 [phone]

## 2024-08-27 NOTE — ED Triage Notes (Addendum)
 Pt reports feeling dizzy while getting in the shower this morning. Pt was seen by EMS at home and instructed to be seen in the ER. Pt denies any numbness in her extremities. Pt reports hx of seizures,

## 2024-09-20 ENCOUNTER — Ambulatory Visit
Admission: RE | Admit: 2024-09-20 | Discharge: 2024-09-20 | Disposition: A | Discharge status: Home / Self Care | Source: Ambulatory Visit | Attending: Physician Assistant | Admitting: Physician Assistant

## 2024-09-20 ENCOUNTER — Other Ambulatory Visit: Payer: Self-pay | Admitting: Physician Assistant

## 2024-09-20 DIAGNOSIS — M79671 Pain in right foot: Secondary | ICD-10-CM | POA: Insufficient documentation

## 2024-12-12 ENCOUNTER — Other Ambulatory Visit: Payer: Self-pay | Admitting: Internal Medicine

## 2024-12-12 DIAGNOSIS — Z1231 Encounter for screening mammogram for malignant neoplasm of breast: Secondary | ICD-10-CM

## 2025-01-16 ENCOUNTER — Ambulatory Visit
Admission: RE | Admit: 2025-01-16 | Discharge: 2025-01-16 | Disposition: A | Discharge status: Home / Self Care | Source: Ambulatory Visit | Attending: Internal Medicine | Admitting: Internal Medicine

## 2025-01-16 DIAGNOSIS — Z1231 Encounter for screening mammogram for malignant neoplasm of breast: Secondary | ICD-10-CM | POA: Diagnosis present
